# Patient Record
Sex: Female | Born: 1950 | Race: White | Hispanic: No | Smoking: Former smoker
Health system: Southern US, Community
[De-identification: ages and names within clinical notes are randomized; demographics above are authoritative.]

## PROBLEM LIST (undated history)

## (undated) DIAGNOSIS — F419 Anxiety disorder, unspecified: Secondary | ICD-10-CM

## (undated) DIAGNOSIS — E538 Deficiency of other specified B group vitamins: Secondary | ICD-10-CM

## (undated) DIAGNOSIS — M1990 Unspecified osteoarthritis, unspecified site: Secondary | ICD-10-CM

## (undated) DIAGNOSIS — R011 Cardiac murmur, unspecified: Secondary | ICD-10-CM

## (undated) DIAGNOSIS — D732 Chronic congestive splenomegaly: Secondary | ICD-10-CM

## (undated) DIAGNOSIS — K7460 Unspecified cirrhosis of liver: Secondary | ICD-10-CM

## (undated) DIAGNOSIS — R911 Solitary pulmonary nodule: Secondary | ICD-10-CM

## (undated) DIAGNOSIS — I639 Cerebral infarction, unspecified: Secondary | ICD-10-CM

## (undated) DIAGNOSIS — Z86018 Personal history of other benign neoplasm: Secondary | ICD-10-CM

## (undated) DIAGNOSIS — K589 Irritable bowel syndrome without diarrhea: Secondary | ICD-10-CM

## (undated) DIAGNOSIS — Z87891 Personal history of nicotine dependence: Secondary | ICD-10-CM

## (undated) DIAGNOSIS — E119 Type 2 diabetes mellitus without complications: Secondary | ICD-10-CM

## (undated) DIAGNOSIS — E559 Vitamin D deficiency, unspecified: Secondary | ICD-10-CM

## (undated) DIAGNOSIS — L299 Pruritus, unspecified: Secondary | ICD-10-CM

## (undated) DIAGNOSIS — Z720 Tobacco use: Secondary | ICD-10-CM

## (undated) DIAGNOSIS — M8000XA Age-related osteoporosis with current pathological fracture, unspecified site, initial encounter for fracture: Secondary | ICD-10-CM

## (undated) DIAGNOSIS — Z8719 Personal history of other diseases of the digestive system: Secondary | ICD-10-CM

## (undated) DIAGNOSIS — J45909 Unspecified asthma, uncomplicated: Secondary | ICD-10-CM

## (undated) DIAGNOSIS — J189 Pneumonia, unspecified organism: Secondary | ICD-10-CM

## (undated) DIAGNOSIS — I10 Essential (primary) hypertension: Secondary | ICD-10-CM

## (undated) DIAGNOSIS — Q859 Phakomatosis, unspecified: Secondary | ICD-10-CM

## (undated) DIAGNOSIS — Z800 Family history of malignant neoplasm of digestive organs: Secondary | ICD-10-CM

## (undated) DIAGNOSIS — J449 Chronic obstructive pulmonary disease, unspecified: Secondary | ICD-10-CM

## (undated) DIAGNOSIS — K219 Gastro-esophageal reflux disease without esophagitis: Secondary | ICD-10-CM

## (undated) DIAGNOSIS — R3129 Other microscopic hematuria: Secondary | ICD-10-CM

## (undated) DIAGNOSIS — R0600 Dyspnea, unspecified: Secondary | ICD-10-CM

## (undated) DIAGNOSIS — I700 Atherosclerosis of aorta: Secondary | ICD-10-CM

## (undated) DIAGNOSIS — E785 Hyperlipidemia, unspecified: Secondary | ICD-10-CM

## (undated) DIAGNOSIS — Z8051 Family history of malignant neoplasm of kidney: Secondary | ICD-10-CM

## (undated) DIAGNOSIS — K3189 Other diseases of stomach and duodenum: Secondary | ICD-10-CM

## (undated) DIAGNOSIS — Z803 Family history of malignant neoplasm of breast: Secondary | ICD-10-CM

## (undated) DIAGNOSIS — Z801 Family history of malignant neoplasm of trachea, bronchus and lung: Secondary | ICD-10-CM

## (undated) DIAGNOSIS — R809 Proteinuria, unspecified: Secondary | ICD-10-CM

## (undated) DIAGNOSIS — Z972 Presence of dental prosthetic device (complete) (partial): Secondary | ICD-10-CM

## (undated) HISTORY — DX: Personal history of nicotine dependence: Z87.891

## (undated) HISTORY — DX: Deficiency of other specified B group vitamins: E53.8

## (undated) HISTORY — DX: Hyperlipidemia, unspecified: E78.5

## (undated) HISTORY — DX: Irritable bowel syndrome, unspecified: K58.9

## (undated) HISTORY — DX: Family history of malignant neoplasm of kidney: Z80.51

## (undated) HISTORY — DX: Gastro-esophageal reflux disease without esophagitis: K21.9

## (undated) HISTORY — DX: Type 2 diabetes mellitus without complications: E11.9

## (undated) HISTORY — DX: Cerebral infarction, unspecified: I63.9

## (undated) HISTORY — PX: COLON SURGERY: SHX602

## (undated) HISTORY — DX: Tobacco use: Z72.0

## (undated) HISTORY — PX: HERNIA REPAIR: SHX51

## (undated) HISTORY — PX: TONSILLECTOMY: SUR1361

## (undated) HISTORY — DX: Family history of malignant neoplasm of digestive organs: Z80.0

## (undated) HISTORY — DX: Phakomatosis, unspecified: Q85.9

## (undated) HISTORY — DX: Chronic obstructive pulmonary disease, unspecified: J44.9

## (undated) HISTORY — DX: Family history of malignant neoplasm of breast: Z80.3

## (undated) HISTORY — PX: VENTRAL HERNIA REPAIR: SHX424

## (undated) HISTORY — PX: PARTIAL COLECTOMY: SHX5273

## (undated) HISTORY — DX: Essential (primary) hypertension: I10

## (undated) HISTORY — DX: Family history of malignant neoplasm of trachea, bronchus and lung: Z80.1

---
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
## 2005-05-21 ENCOUNTER — Emergency Department: Payer: Self-pay | Admitting: Emergency Medicine

## 2005-05-27 ENCOUNTER — Ambulatory Visit: Payer: Self-pay | Admitting: Internal Medicine

## 2005-07-23 ENCOUNTER — Ambulatory Visit: Payer: Self-pay | Admitting: Unknown Physician Specialty

## 2005-08-14 ENCOUNTER — Ambulatory Visit: Payer: Self-pay | Admitting: Internal Medicine

## 2006-12-29 ENCOUNTER — Ambulatory Visit: Payer: Self-pay | Admitting: Unknown Physician Specialty

## 2007-01-19 ENCOUNTER — Ambulatory Visit: Payer: Self-pay | Admitting: Surgery

## 2007-01-25 ENCOUNTER — Ambulatory Visit: Payer: Self-pay | Admitting: Surgery

## 2007-01-25 ENCOUNTER — Other Ambulatory Visit: Payer: Self-pay

## 2007-02-01 ENCOUNTER — Inpatient Hospital Stay: Payer: Self-pay | Admitting: Surgery

## 2007-09-21 ENCOUNTER — Ambulatory Visit: Payer: Self-pay | Admitting: Internal Medicine

## 2008-06-12 ENCOUNTER — Ambulatory Visit: Payer: Self-pay | Admitting: Surgery

## 2008-06-19 ENCOUNTER — Ambulatory Visit: Payer: Self-pay | Admitting: Surgery

## 2009-10-05 ENCOUNTER — Emergency Department: Payer: Self-pay | Admitting: Internal Medicine

## 2009-11-01 ENCOUNTER — Ambulatory Visit: Payer: Self-pay | Admitting: Adult Health

## 2009-12-22 ENCOUNTER — Emergency Department: Payer: Self-pay | Admitting: Emergency Medicine

## 2010-05-28 ENCOUNTER — Emergency Department: Payer: Self-pay | Admitting: Emergency Medicine

## 2011-07-16 ENCOUNTER — Ambulatory Visit: Payer: Self-pay | Admitting: Internal Medicine

## 2011-07-20 ENCOUNTER — Emergency Department: Payer: Self-pay | Admitting: Emergency Medicine

## 2011-07-20 LAB — URINALYSIS, COMPLETE
Bacteria: NEGATIVE
Glucose,UR: 500 mg/dL (ref 0–75)
Nitrite: NEGATIVE
Protein: NEGATIVE
Specific Gravity: 1.021 (ref 1.003–1.030)

## 2011-07-20 LAB — COMPREHENSIVE METABOLIC PANEL
Albumin: 3.7 g/dL (ref 3.4–5.0)
Alkaline Phosphatase: 119 U/L (ref 50–136)
Anion Gap: 16 (ref 7–16)
BUN: 19 mg/dL — ABNORMAL HIGH (ref 7–18)
Glucose: 345 mg/dL — ABNORMAL HIGH (ref 65–99)
Osmolality: 292 (ref 275–301)
Potassium: 4.2 mmol/L (ref 3.5–5.1)
SGOT(AST): 28 U/L (ref 15–37)
Sodium: 138 mmol/L (ref 136–145)
Total Protein: 7.6 g/dL (ref 6.4–8.2)

## 2011-07-20 LAB — CBC
MCH: 26.2 pg (ref 26.0–34.0)
MCHC: 32.8 g/dL (ref 32.0–36.0)
MCV: 80 fL (ref 80–100)
Platelet: 195 10*3/uL (ref 150–440)
RDW: 16.8 % — ABNORMAL HIGH (ref 11.5–14.5)
WBC: 6.8 10*3/uL (ref 3.6–11.0)

## 2013-04-16 ENCOUNTER — Emergency Department: Payer: Self-pay | Admitting: Internal Medicine

## 2013-12-07 ENCOUNTER — Ambulatory Visit: Payer: Self-pay | Admitting: Surgery

## 2013-12-19 ENCOUNTER — Ambulatory Visit: Payer: Self-pay | Admitting: Surgery

## 2014-03-22 ENCOUNTER — Ambulatory Visit: Payer: Self-pay | Admitting: Physical Medicine and Rehabilitation

## 2014-04-05 DIAGNOSIS — M7062 Trochanteric bursitis, left hip: Secondary | ICD-10-CM

## 2014-04-05 DIAGNOSIS — M5416 Radiculopathy, lumbar region: Secondary | ICD-10-CM | POA: Insufficient documentation

## 2014-04-05 DIAGNOSIS — M51369 Other intervertebral disc degeneration, lumbar region without mention of lumbar back pain or lower extremity pain: Secondary | ICD-10-CM | POA: Insufficient documentation

## 2014-04-05 DIAGNOSIS — M5136 Other intervertebral disc degeneration, lumbar region: Secondary | ICD-10-CM | POA: Insufficient documentation

## 2014-04-05 DIAGNOSIS — M7061 Trochanteric bursitis, right hip: Secondary | ICD-10-CM | POA: Insufficient documentation

## 2014-09-08 NOTE — Op Note (Signed)
PATIENT NAME:  Brenda Beasley, Brenda Beasley MR#:  [id number] DATE OF BIRTH:  1950-11-29  DATE OF PROCEDURE:  12/19/2013  PREOPERATIVE DIAGNOSIS: Ventral hernia.   POSTOPERATIVE DIAGNOSIS: Ventral hernia.   PROCEDURE: Ventral hernia repair.   SURGEON: Loreli Dollar, M.D.   ANESTHESIA: General.   INDICATIONS: This 64 year old has a past history of right colectomy, which was a laparoscopic extraction site in the epigastrium. She has recently noted bulging in the epigastrium just slightly to the right of the midline. A ventral hernia was demonstrated on physical examination, and repair was recommended for definitive treatment.   DESCRIPTION OF PROCEDURE: The patient was placed on the operating table in the supine position under general anesthesia. The abdomen was prepared with ChloraPrep and draped in a sterile manner. There was a palpable mass in the epigastrium at the upper end of the midline incision just slightly to the right of the midline. The palpable mass was approximately 4 cm in dimension. A longitudinally oriented incision was made in the old scar. This incision was approximately 5 cm in length, carried down through subcutaneous tissues, and encountered a ventral hernia sac, which was dissected free from the surrounding structures and dissected up into a fascial ring defect. The sac was separated from the fascial ring defect and was found to contain incarcerated omentum. The fascial defect was enlarged on the right side extending out approximately 8 mm and subsequently could ligate the incarcerated omentum with 0 Surgilon suture ligature, and amputated the ligation site. It appeared to be hemostatic and was then reduced back into the abdominal cavity. Next, the fascial edges were examined. A finger was introduced, and I did not feel any adjacent defect. The properitoneal fat was dissected away from the fascia circumferentially. I then selected a Bard soft mesh, which was cut to create an oval shape of some  1.4 x 1.8 cm, and this was placed oriented transversely in the properitoneal plane, and was sutured to the overlying fascia with 0 Surgilon through and through sutures. Also, the repair was carried out with a transversely oriented suture line of interrupted 0 Surgilon figure-of-8 sutures incorporating mesh into each suture.   The repair looked good. Hemostasis was intact. Subcutaneous tissues and tissues around the repair were infiltrated with 0.5% Sensorcaine with epinephrine. Subcutaneous tissues were approximated with 4-0 chromic pursestring suture. Several other sites of old scar formation were approximated with 4-0 Monocryl. The skin was closed with running 4-0 Monocryl subcuticular suture and Dermabond.   The patient tolerated surgery satisfactorily and was then prepared for transfer to the recovery room.   ____________________________ Lenna Sciara. Rochel Brome, MD jws:jr D: 12/19/2013 12:12:32 ET T: 12/19/2013 12:20:36 ET JOB#: [id number]  cc: Loreli Dollar, MD, <Dictator> Loreli Dollar MD ELECTRONICALLY SIGNED 12/28/2013 18:32

## 2014-10-04 ENCOUNTER — Encounter: Payer: Self-pay | Admitting: Family Medicine

## 2014-10-04 ENCOUNTER — Other Ambulatory Visit: Payer: Self-pay | Admitting: Family Medicine

## 2014-10-04 DIAGNOSIS — Z720 Tobacco use: Secondary | ICD-10-CM

## 2014-10-04 HISTORY — DX: Tobacco use: Z72.0

## 2014-10-05 ENCOUNTER — Ambulatory Visit
Admission: RE | Admit: 2014-10-05 | Discharge: 2014-10-05 | Disposition: A | Discharge status: Home / Self Care | Payer: Medicare Other | Source: Ambulatory Visit | Attending: Family Medicine | Admitting: Family Medicine

## 2014-10-05 ENCOUNTER — Inpatient Hospital Stay: Payer: Medicaid Other | Attending: Family Medicine | Admitting: Family Medicine

## 2014-10-05 DIAGNOSIS — Z122 Encounter for screening for malignant neoplasm of respiratory organs: Secondary | ICD-10-CM

## 2014-10-05 DIAGNOSIS — I700 Atherosclerosis of aorta: Secondary | ICD-10-CM | POA: Insufficient documentation

## 2014-10-05 DIAGNOSIS — F17210 Nicotine dependence, cigarettes, uncomplicated: Secondary | ICD-10-CM | POA: Diagnosis not present

## 2014-10-05 DIAGNOSIS — F17200 Nicotine dependence, unspecified, uncomplicated: Secondary | ICD-10-CM | POA: Insufficient documentation

## 2014-10-05 DIAGNOSIS — Z720 Tobacco use: Secondary | ICD-10-CM

## 2014-10-05 DIAGNOSIS — J439 Emphysema, unspecified: Secondary | ICD-10-CM | POA: Diagnosis not present

## 2014-10-05 NOTE — Progress Notes (Signed)
In accordance with CMS guidelines, patient has meet eligibility criteria including age, absence of signs or symptoms of lung cancer, the specific calculation of cigarette smoking pack-years was 50 years and is a current smoker.   A shared decision-making session was conducted prior to the performance of CT scan. This includes one or more decision aids, includes benefits and harms of screening, follow-up diagnostic testing, over-diagnosis, false positive rate, and total radiation exposure.  Counseling on the importance of adherence to annual lung cancer LDCT screening, impact of co-morbidities, and ability or willingness to undergo diagnosis and treatment is imperative for compliance of the program.  Counseling on the importance of continued smoking cessation for former smokers; the importance of smoking cessation for current smokers and information about tobacco cessation interventions have been given to patient including the [place] at [facility], [address] [place], as well as Cancer Center specific smoking cessation programs.  Written order for lung cancer screening with LDCT has been given to the patient and any and all questions have been answered to the best of my abilities.   Yearly follow up will be scheduled by Shawn Perkins, Thoracic Navigator.   

## 2014-10-09 ENCOUNTER — Telehealth: Payer: Self-pay | Admitting: *Deleted

## 2014-10-09 NOTE — Telephone Encounter (Signed)
Oncology Nurse Navigator Documentation  Oncology Nurse Navigator Flowsheets 10/09/2014  Navigator Encounter Type Screening  Interventions Coordination of Care  Time Spent with Patient 30   Notified patient of LDCT lung cancer screening results of Lung Rads 2 finding with recommendation for 12 month follow up imaging. Also notified of incidental finding of Mediastinum: "Calcified atherosclerotic disease involves the thoracic aorta as well as the RCA and left circumflex Coronary arteries.Focal ectasia of the distal thoracic aorta is noted which measures up to 3.6 cm, image 176 of series 5 and image 37 of series 2." Message with this info has been sent to PCP.

## 2015-03-05 DIAGNOSIS — R809 Proteinuria, unspecified: Secondary | ICD-10-CM | POA: Insufficient documentation

## 2015-09-03 DIAGNOSIS — I10 Essential (primary) hypertension: Secondary | ICD-10-CM | POA: Insufficient documentation

## 2015-09-03 DIAGNOSIS — J439 Emphysema, unspecified: Secondary | ICD-10-CM | POA: Insufficient documentation

## 2015-09-16 ENCOUNTER — Telehealth: Payer: Self-pay | Admitting: *Deleted

## 2015-09-16 NOTE — Telephone Encounter (Signed)
Notified patient that her annual  follow up lung cancer screening low dose CT scan is due. Confirmed that patient is within age range of 55-77, asymptomatic of lung cancer, and no other serious disease processes that would make treatment of lung cancer not possible. The patient is a  current smoker  with a 51 pack year history. The shared decision making visit was completed 10/05/2014. The patient is agreeable for CT scan to be scheduled once insurance has approved it.

## 2015-09-20 ENCOUNTER — Encounter: Payer: Self-pay | Admitting: Family Medicine

## 2015-09-20 ENCOUNTER — Other Ambulatory Visit: Payer: Self-pay | Admitting: Family Medicine

## 2015-09-20 DIAGNOSIS — Z87891 Personal history of nicotine dependence: Secondary | ICD-10-CM

## 2015-09-20 HISTORY — DX: Personal history of nicotine dependence: Z87.891

## 2015-10-04 ENCOUNTER — Ambulatory Visit
Admission: RE | Admit: 2015-10-04 | Discharge: 2015-10-04 | Disposition: A | Discharge status: Home / Self Care | Payer: Medicare Other | Source: Ambulatory Visit | Attending: Family Medicine | Admitting: Family Medicine

## 2015-10-04 DIAGNOSIS — I2510 Atherosclerotic heart disease of native coronary artery without angina pectoris: Secondary | ICD-10-CM | POA: Insufficient documentation

## 2015-10-04 DIAGNOSIS — Z87891 Personal history of nicotine dependence: Secondary | ICD-10-CM | POA: Diagnosis not present

## 2015-10-08 ENCOUNTER — Telehealth: Payer: Self-pay | Admitting: *Deleted

## 2015-10-08 NOTE — Telephone Encounter (Signed)
Notified patient of LDCT lung cancer screening results with recommendation for 12 month follow up imaging. Also notified of incidental finding noted below. Patient verbalizes understanding.   IMPRESSION: 1. Lung-RADS Category 2, benign appearance or behavior. Continue annual screening with low-dose chest CT without contrast in 12 months. 2. Three-vessel coronary artery calcification.

## 2015-11-04 ENCOUNTER — Encounter: Payer: Self-pay | Admitting: *Deleted

## 2015-11-04 ENCOUNTER — Encounter: Payer: Medicare Other | Attending: Internal Medicine | Admitting: *Deleted

## 2015-11-04 VITALS — BP 140/70 | Ht 60.0 in | Wt 157.2 lb

## 2015-11-04 DIAGNOSIS — E119 Type 2 diabetes mellitus without complications: Secondary | ICD-10-CM | POA: Insufficient documentation

## 2015-11-04 NOTE — Progress Notes (Signed)
Diabetes Self-Management Education  Visit Type: First/Initial  Appt. Start Time: 1050 Appt. End Time: 1210  11/04/2015  Brenda Beasley, identified by name and date of birth, is a 65 y.o. female with a diagnosis of Diabetes: Type 2.   ASSESSMENT  Blood pressure 140/70, height 5' (1.524 m), weight 157 lb 3.2 oz (71.305 kg). Body mass index is 30.7 kg/(m^2).      Diabetes Self-Management Education - 11/04/15 1322    Visit Information   Visit Type First/Initial   Initial Visit   Diabetes Type Type 2   Are you currently following a meal plan? Yes   What type of meal plan do you follow? "no sweets, no bread, no potatoes"   Are you taking your medications as prescribed? Yes   Date Diagnosed "can't remember"   Health Coping   How would you rate your overall health? Good   Psychosocial Assessment   Patient Belief/Attitude about Diabetes Motivated to manage diabetes   Self-management support Doctor's office;Family   Patient Concerns Nutrition/Meal planning;Glycemic Control;Problem Solving   Special Needs None   Preferred Learning Style Hands on   Learning Readiness Change in progress   How often do you need to have someone help you when you read instructions, pamphlets, or other written materials from your doctor or pharmacy? 2 - Rarely   What is the last grade level you completed in school? 9th grade   Pre-Education Assessment   Patient understands the diabetes disease and treatment process. Needs Instruction   Patient understands incorporating nutritional management into lifestyle. Needs Instruction   Patient undertands incorporating physical activity into lifestyle. Needs Instruction   Patient understands using medications safely. Needs Instruction   Patient understands monitoring blood glucose, interpreting and using results Needs Instruction   Patient understands prevention, detection, and treatment of acute complications. Needs Instruction   Patient understands prevention,  detection, and treatment of chronic complications. Needs Instruction   Patient understands how to develop strategies to address psychosocial issues. Needs Instruction   Patient understands how to develop strategies to promote health/change behavior. Needs Instruction   Complications   Last HgB A1C per patient/outside source 8.3 %  08/27/15   How often do you check your blood sugar? 1-2 times/day   Fasting Blood glucose range (mg/dL) 130-179;70-129  FBG's 120-174 mg/dL   Postprandial Blood glucose range (mg/dL) --  BG at 10 pm - 105-241 mg/dL   Number of hypoglycemic episodes per month 2   Can you tell when your blood sugar is low? Yes   What do you do if your blood sugar is low? drank orange juice and ate peanut butter crackers   Have you had a dilated eye exam in the past 12 months? No   Have you had a dental exam in the past 12 months? Yes  dentures   Are you checking your feet? Yes   How many days per week are you checking your feet? 7   Dietary Intake   Breakfast bacon and mayo sandwich   Lunch tomato sandwich   Dinner baked/fried chicken; pork tenderloin; cubed steak; cabbage green beans   Beverage(s) unsweetened tea, coffee, water, occasional diet soda   Exercise   Exercise Type Light (walking / raking leaves)   How many days per week to you exercise? 7   How many minutes per day do you exercise? 10   Total minutes per week of exercise 70   Patient Education   Previous Diabetes Education No   Disease state  Definition of diabetes, type 1 and 2, and the diagnosis of diabetes   Nutrition management  Role of diet in the treatment of diabetes and the relationship between the three main macronutrients and blood glucose level   Physical activity and exercise  Role of exercise on diabetes management, blood pressure control and cardiac health.   Medications Reviewed patients medication for diabetes, action, purpose, timing of dose and side effects.   Monitoring Purpose and frequency  of SMBG.;Identified appropriate SMBG and/or A1C goals.   Acute complications Taught treatment of hypoglycemia - the 15 rule.   Chronic complications Relationship between chronic complications and blood glucose control;Retinopathy and reason for yearly dilated eye exams   Psychosocial adjustment Identified and addressed patients feelings and concerns about diabetes   Personal strategies to promote health Review risk of smoking and offered smoking cessation   Individualized Goals (developed by patient)   Reducing Risk Improve blood sugars Prevent diabetes complications   Outcomes   Expected Outcomes Demonstrated interest in learning. Expect positive outcomes      Individualized Plan for Diabetes Self-Management Training:   Learning Objective:  Patient will have a greater understanding of diabetes self-management. Patient education plan is to attend individual and/or group sessions per assessed needs and concerns.   Plan:   Patient Instructions  Check blood sugars 2 x day before breakfast and 2 hrs after supper every day Exercise: Continue walking for  10  minutes   7  days a week Eat 3 meals day,  1-2 snacks a day Space meals 4-6 hours apart Don't skip meals Include small serving of protein with lunch Eat a small snack at bedtime with 1 serving of protein and 1 serving of carbohydrate Make an eye doctor appointment Bring blood sugar records to the next class Carry fast acting glucose and a snack at all times   Expected Outcomes:  Demonstrated interest in learning. Expect positive outcomes  Education material provided:  General Meal Planning Guidelines Simple Meal Plan Glucose tablets Symptoms, causes and treatments of Hypoglycemia  If problems or questions, patient to contact team via:  Johny Drilling, [place], [place], CDE [phone]  Future DSME appointment:  Thursday November 28, 2015 for Diabetes Class 1

## 2015-11-04 NOTE — Patient Instructions (Addendum)
Check blood sugars 2 x day before breakfast and 2 hrs after supper every day Exercise: Continue walking for  10  minutes   7  days a week Eat 3 meals day,  1-2 snacks a day Space meals 4-6 hours apart Don't skip meals Include small serving of protein with lunch Eat a small snack at bedtime with 1 serving of protein and 1 serving of carbohydrate Make an eye doctor appointment Bring blood sugar records to the next class Carry fast acting glucose and a snack at all times

## 2015-11-28 ENCOUNTER — Encounter: Payer: Self-pay | Admitting: Dietician

## 2015-11-28 ENCOUNTER — Encounter: Payer: Medicare Other | Attending: Internal Medicine | Admitting: Dietician

## 2015-11-28 VITALS — Ht 60.0 in | Wt 157.8 lb

## 2015-11-28 DIAGNOSIS — E119 Type 2 diabetes mellitus without complications: Secondary | ICD-10-CM

## 2015-11-28 NOTE — Progress Notes (Signed)

## 2015-12-05 ENCOUNTER — Encounter: Payer: Medicare Other | Admitting: *Deleted

## 2015-12-05 ENCOUNTER — Encounter: Payer: Self-pay | Admitting: *Deleted

## 2015-12-05 VITALS — Wt 155.8 lb

## 2015-12-05 DIAGNOSIS — E119 Type 2 diabetes mellitus without complications: Secondary | ICD-10-CM | POA: Diagnosis not present

## 2015-12-05 NOTE — Progress Notes (Signed)

## 2015-12-12 ENCOUNTER — Encounter: Payer: Medicare Other | Admitting: Dietician

## 2015-12-12 ENCOUNTER — Encounter: Payer: Self-pay | Admitting: Dietician

## 2015-12-12 VITALS — BP 120/78 | Ht 60.0 in | Wt 157.1 lb

## 2015-12-12 DIAGNOSIS — E119 Type 2 diabetes mellitus without complications: Secondary | ICD-10-CM

## 2015-12-12 NOTE — Progress Notes (Signed)

## 2015-12-13 ENCOUNTER — Encounter: Payer: Self-pay | Admitting: *Deleted

## 2015-12-19 DIAGNOSIS — M47816 Spondylosis without myelopathy or radiculopathy, lumbar region: Secondary | ICD-10-CM | POA: Insufficient documentation

## 2016-01-07 DIAGNOSIS — I700 Atherosclerosis of aorta: Secondary | ICD-10-CM | POA: Insufficient documentation

## 2016-03-30 ENCOUNTER — Encounter: Payer: Self-pay | Admitting: Emergency Medicine

## 2016-03-30 ENCOUNTER — Emergency Department: Payer: Medicare Other

## 2016-03-30 ENCOUNTER — Emergency Department
Admission: EM | Admit: 2016-03-30 | Discharge: 2016-03-30 | Disposition: A | Discharge status: Home / Self Care | Payer: Medicare Other | Attending: Student in an Organized Health Care Education/Training Program | Admitting: Student in an Organized Health Care Education/Training Program

## 2016-03-30 DIAGNOSIS — E119 Type 2 diabetes mellitus without complications: Secondary | ICD-10-CM | POA: Insufficient documentation

## 2016-03-30 DIAGNOSIS — Z7984 Long term (current) use of oral hypoglycemic drugs: Secondary | ICD-10-CM | POA: Insufficient documentation

## 2016-03-30 DIAGNOSIS — Z7982 Long term (current) use of aspirin: Secondary | ICD-10-CM | POA: Insufficient documentation

## 2016-03-30 DIAGNOSIS — J441 Chronic obstructive pulmonary disease with (acute) exacerbation: Secondary | ICD-10-CM

## 2016-03-30 DIAGNOSIS — M546 Pain in thoracic spine: Secondary | ICD-10-CM | POA: Diagnosis not present

## 2016-03-30 DIAGNOSIS — Z79899 Other long term (current) drug therapy: Secondary | ICD-10-CM | POA: Diagnosis not present

## 2016-03-30 DIAGNOSIS — R0602 Shortness of breath: Secondary | ICD-10-CM

## 2016-03-30 DIAGNOSIS — I10 Essential (primary) hypertension: Secondary | ICD-10-CM | POA: Diagnosis not present

## 2016-03-30 DIAGNOSIS — F17210 Nicotine dependence, cigarettes, uncomplicated: Secondary | ICD-10-CM | POA: Diagnosis not present

## 2016-03-30 LAB — BASIC METABOLIC PANEL
ANION GAP: 9 (ref 5–15)
BUN: 16 mg/dL (ref 6–20)
CALCIUM: 9.3 mg/dL (ref 8.9–10.3)
CO2: 26 mmol/L (ref 22–32)
Chloride: 103 mmol/L (ref 101–111)
Creatinine, Ser: 0.73 mg/dL (ref 0.44–1.00)
Glucose, Bld: 204 mg/dL — ABNORMAL HIGH (ref 65–99)
POTASSIUM: 4.2 mmol/L (ref 3.5–5.1)
SODIUM: 138 mmol/L (ref 135–145)

## 2016-03-30 LAB — CBC
HEMATOCRIT: 37.5 % (ref 35.0–47.0)
HEMOGLOBIN: 12.4 g/dL (ref 12.0–16.0)
MCH: 26.2 pg (ref 26.0–34.0)
MCHC: 33.1 g/dL (ref 32.0–36.0)
MCV: 79.3 fL — ABNORMAL LOW (ref 80.0–100.0)
Platelets: 174 10*3/uL (ref 150–440)
RBC: 4.73 MIL/uL (ref 3.80–5.20)
RDW: 16.1 % — AB (ref 11.5–14.5)
WBC: 8.9 10*3/uL (ref 3.6–11.0)

## 2016-03-30 LAB — TROPONIN I: Troponin I: <0.03 ng/mL (ref <0.03)

## 2016-03-30 LAB — FIBRIN DERIVATIVES D-DIMER (ARMC ONLY): FIBRIN DERIVATIVES D-DIMER (ARMC): 389 (ref 0–499)

## 2016-03-30 LAB — MAGNESIUM: MAGNESIUM: 1.4 mg/dL — AB (ref 1.7–2.4)

## 2016-03-30 MED ORDER — TRAMADOL HCL 50 MG PO TABS: 50.0000 mg | ORAL_TABLET | Freq: Four times a day (QID) | ORAL | 0 refills | Status: AC | PRN

## 2016-03-30 MED ORDER — FENTANYL CITRATE (PF) 100 MCG/2ML IJ SOLN: 50.0000 ug | INTRAMUSCULAR | Status: DC | PRN

## 2016-03-30 MED ORDER — METHYLPREDNISOLONE SODIUM SUCC 125 MG IJ SOLR
125.0000 mg | Freq: Once | INTRAMUSCULAR | Status: AC
  Administered 2016-03-30: 125 mg via INTRAVENOUS
  Filled 2016-03-30: qty 2

## 2016-03-30 MED ORDER — AZITHROMYCIN 250 MG PO TABS
ORAL_TABLET | ORAL | 0 refills | Status: AC
Start: 2016-03-30 — End: 2016-04-04

## 2016-03-30 MED ORDER — PREDNISONE 20 MG PO TABS: 40.0000 mg | ORAL_TABLET | Freq: Every day | ORAL | 0 refills | Status: AC

## 2016-03-30 MED ORDER — ALBUTEROL SULFATE (2.5 MG/3ML) 0.083% IN NEBU
5.0000 mg | INHALATION_SOLUTION | Freq: Once | RESPIRATORY_TRACT | Status: AC
Start: 2016-03-30 — End: 2016-03-30
  Administered 2016-03-30: 5 mg via RESPIRATORY_TRACT
  Filled 2016-03-30: qty 6

## 2016-03-30 MED ORDER — AZITHROMYCIN 500 MG PO TABS
500.0000 mg | ORAL_TABLET | Freq: Once | ORAL | Status: AC
  Administered 2016-03-30: 500 mg via ORAL
  Filled 2016-03-30: qty 1

## 2016-03-30 MED ORDER — IBUPROFEN 600 MG PO TABS
600.0000 mg | ORAL_TABLET | Freq: Once | ORAL | Status: AC
  Administered 2016-03-30: 600 mg via ORAL
  Filled 2016-03-30: qty 1

## 2016-03-30 MED ORDER — IPRATROPIUM-ALBUTEROL 0.5-2.5 (3) MG/3ML IN SOLN
3.0000 mL | Freq: Once | RESPIRATORY_TRACT | Status: AC
  Administered 2016-03-30: 3 mL via RESPIRATORY_TRACT
  Filled 2016-03-30: qty 3

## 2016-03-30 MED ORDER — CYCLOBENZAPRINE HCL 10 MG PO TABS: 10.0000 mg | ORAL_TABLET | Freq: Every day | ORAL | 0 refills | Status: AC

## 2016-03-30 MED ORDER — MAGNESIUM SULFATE 2 GM/50ML IV SOLN
2.0000 g | Freq: Once | INTRAVENOUS | Status: AC
  Administered 2016-03-30: 2 g via INTRAVENOUS
  Filled 2016-03-30: qty 50

## 2016-03-30 NOTE — Discharge Instructions (Signed)
Return to ER for any increase in pain, if the pain changes or becomes worse with physical activity, you have shortness of breath, nausea or vomiting associated with the chest pain.   Call Dr. Bethanne Ginger office today.  He would like to see you in clinic if possible.

## 2016-03-30 NOTE — ED Provider Notes (Signed)
[facility] Emergency Department Provider Note    First MD Initiated Contact with Patient 03/30/16 1110     (approximate)  I have reviewed the triage vital signs and the nursing notes.   HISTORY  Chief Complaint Shortness of Breath    HPI Brenda Beasley is a 65 y.o. female with a history of COPD as well as diabetes hypertension and persistent up 1 pack per day smoking history who presents with worsening shortness of breath and bilateral posterior thoracic pain that she describes as worse with taking a deep breath for the past 2 days. She denies any fevers but has had a productive cough as well as nasal congestion. States that she did not sleep at all last night due to worsening shortness of breath and orthopnea. Has been using her inhalers more frequently than previous. She denies any chest pain or pressure. Denies any abdominal pain. No lower extremity swelling.   Past Medical History:  Diagnosis Date  . COPD (chronic obstructive pulmonary disease) ([place])   . Diabetes mellitus without complication ([place])   . GERD (gastroesophageal reflux disease)   . Hyperlipidemia   . Hypertension   . IBS (irritable bowel syndrome)   . Personal history of tobacco use, presenting hazards to health 09/20/2015  . Stroke ([place])   . Tobacco abuse 10/04/2014  . Vitamin B 12 deficiency    Family History  Problem Relation Age of Onset  . Diabetes Mother   . Diabetes Father   . Diabetes Sister   . Diabetes Maternal Grandmother   . Diabetes Paternal Grandmother    Past Surgical History:  Procedure Laterality Date  . PARTIAL COLECTOMY    . TONSILLECTOMY    . VENTRAL HERNIA REPAIR     Patient Active Problem List   Diagnosis Date Noted  . Personal history of tobacco use, presenting hazards to health 09/20/2015  . Tobacco abuse 10/04/2014      Prior to Admission medications   Medication Sig Start Date End Date Taking? Authorizing Provider  albuterol (PROAIR HFA) 108  (90 Base) MCG/ACT inhaler Inhale 2 puffs into the lungs every 6 (six) hours as needed. 03/05/15   Historical Provider, MD  amLODipine (NORVASC) 5 MG tablet Take 5 mg by mouth daily. 03/05/15   Historical Provider, MD  aspirin EC 81 MG tablet Take 81 mg by mouth daily.    Historical Provider, MD  azelastine (ASTELIN) 0.1 % nasal spray Place 1 spray into the nose 2 (two) times daily. 09/03/15   Historical Provider, MD  benzonatate (TESSALON) 200 MG capsule Take 200 mg by mouth 3 (three) times daily as needed. 04/05/15   Historical Provider, MD  Cholecalciferol (VITAMIN D3) 2000 units capsule Take 2 capsules by mouth daily.    Historical Provider, MD  cyanocobalamin (,VITAMIN B-12,) 1000 MCG/ML injection Inject 1,000 mcg into the muscle every 30 (thirty) days. 10/23/14   Historical Provider, MD  ferrous sulfate 325 (65 FE) MG tablet Take 325 mg by mouth daily. 03/15/15   Historical Provider, MD  fluticasone (FLONASE) 50 MCG/ACT nasal spray Place 2 sprays into the nose daily. 03/05/15   Historical Provider, MD  gabapentin (NEURONTIN) 100 MG capsule Take 2 capsules by mouth at bedtime. 03/05/15   Historical Provider, MD  glipiZIDE (GLUCOTROL) 10 MG tablet Take 2 tablets by mouth 2 (two) times daily. 03/15/15   Historical Provider, MD  hydrochlorothiazide (HYDRODIURIL) 25 MG tablet Take 25 mg by mouth daily. 03/05/15   Historical Provider, MD  lisinopril (PRINIVIL,ZESTRIL) 40 MG tablet Take 40 mg by mouth daily. 03/05/15   Historical Provider, MD  lovastatin (MEVACOR) 40 MG tablet Take 2 tablets by mouth daily. 03/05/15   Historical Provider, MD  metFORMIN (GLUCOPHAGE) 1000 MG tablet Take 1,000 mg by mouth 2 (two) times daily. 03/05/15   Historical Provider, MD  omeprazole (PRILOSEC) 20 MG capsule Take 20 mg by mouth 2 (two) times daily. 03/05/15   Historical Provider, MD    Allergies Other; Penicillins; Pioglitazone; Byetta 10 mcg pen [exenatide]; Victoza [liraglutide]; and Sulfa antibiotics    Social  History Social History  Substance Use Topics  . Smoking status: Current Every Day Smoker    Packs/day: 0.50    Years: 52.00    Types: Cigarettes  . Smokeless tobacco: Not on file  . Alcohol use No    Review of Systems Patient denies headaches, rhinorrhea, blurry vision, numbness, shortness of breath, chest pain, edema, cough, abdominal pain, nausea, vomiting, diarrhea, dysuria, fevers, rashes or hallucinations unless otherwise stated above in HPI. ____________________________________________   PHYSICAL EXAM:  VITAL SIGNS: Vitals:   03/30/16 0822  BP: (!) 162/67  Pulse: 70  Resp: 20  Temp: 97.9 F (36.6 C)    Constitutional: Alert and oriented.with mild tachypnea and difficulty speaking in complete sentences. Eyes: Conjunctivae are normal. PERRL. EOMI. Head: Atraumatic. Nose: No congestion/rhinnorhea. Mouth/Throat: Mucous membranes are moist.  Oropharynx non-erythematous. Neck: No stridor. Painless ROM. No cervical spine tenderness to palpation Hematological/Lymphatic/Immunilogical: No cervical lymphadenopathy. Cardiovascular: Normal rate, regular rhythm. Grossly normal heart sounds.  Good peripheral circulation. Respiratory: mild tachypnea,   No retractions. Lungs with diminished breathsounds, coarse bibasilar BS Gastrointestinal: Soft and nontender. No distention. No abdominal bruits. No CVA tenderness.  Musculoskeletal: No lower extremity tenderness nor edema.  No joint effusions. Neurologic:  Normal speech and language. No gross focal neurologic deficits are appreciated. No gait instability. Skin:  Skin is warm, dry and intact. No rash noted. Psychiatric: Mood and affect are normal. Speech and behavior are normal.  ____________________________________________   LABS (all labs ordered are listed, but only abnormal results are displayed)  Results for orders placed or performed during the hospital encounter of 03/30/16 (from the past 24 hour(s))  Basic metabolic  panel     Status: Abnormal   Collection Time: 03/30/16  8:26 AM  Result Value Ref Range   Sodium 138 135 - 145 mmol/L   Potassium 4.2 3.5 - 5.1 mmol/L   Chloride 103 101 - 111 mmol/L   CO2 26 22 - 32 mmol/L   Glucose, Bld 204 (H) 65 - 99 mg/dL   BUN 16 6 - 20 mg/dL   Creatinine, Ser 0.73 0.44 - 1.00 mg/dL   Calcium 9.3 8.9 - 10.3 mg/dL   GFR calc non Af Amer >60 >60 mL/min   GFR calc Af Amer >60 >60 mL/min   Anion gap 9 5 - 15  CBC     Status: Abnormal   Collection Time: 03/30/16  8:26 AM  Result Value Ref Range   WBC 8.9 3.6 - 11.0 K/uL   RBC 4.73 3.80 - 5.20 MIL/uL   Hemoglobin 12.4 12.0 - 16.0 g/dL   HCT 37.5 35.0 - 47.0 %   MCV 79.3 (L) 80.0 - 100.0 fL   MCH 26.2 26.0 - 34.0 pg   MCHC 33.1 32.0 - 36.0 g/dL   RDW 16.1 (H) 11.5 - 14.5 %   Platelets 174 150 - 440 K/uL  Troponin I     Status: None  Collection Time: 03/30/16  8:26 AM  Result Value Ref Range   Troponin I <0.03 <0.03 ng/mL   ____________________________________________  EKG My review and personal interpretation at Time: 8:17   Indication: sob  Rate: 65  Rhythm: sinus Axis: normal Other: non specific t wave changes as compared to previous in V3 and V4 ____________________________________________  RADIOLOGY  I personally reviewed all radiographic images ordered to evaluate for the above acute complaints and reviewed radiology reports and findings.  These findings were personally discussed with the patient.  Please see medical record for radiology report. ____________________________________________   PROCEDURES  Procedure(s) performed: none Procedures    Critical Care performed: no ____________________________________________   INITIAL IMPRESSION / ASSESSMENT AND PLAN / ED COURSE  Pertinent labs & imaging results that were available during my care of the patient were reviewed by me and considered in my medical decision making (see chart for details).  DDX: Asthma, copd, CHF, pna, ptx,  malignancy, Pe, anemia   Brenda A Maseda is a 65 y.o. who presents to the ED with Shortness of breath for the past 2 days as well as pleuritic chest pain.  Patient is AFVSS in ED. Exam as above. Given current presentation have considered the above differential.  Given her history of worsening shortness of breath and he suspect underlying COPD exacerbation. Will give nebulizer treatments. Patient denies any chest pain but does have nonspecifc T-wave inversions in precordial leads on EKG. presentation seems less consistent with ACS based on history and duration of symptoms but will further risk stratify with EKG and troponins as she does have multiple risk factors.  Not appear to be clinically consistent with dissection as she has equal pulses bilaterally, is not tachycardic and has a normal crisp aortic knob on chest x-ray Given her pleuritic chest pain will further risk stratify for PE with d-dimer as she is otherwise low risk by well's criteria.  The patient will be placed on continuous pulse oximetry and telemetry for monitoring.  Laboratory evaluation will be sent to evaluate for the above complaints.     Clinical Course as of Mar 30 1424  Mon Mar 30, 2016  1156 Mag low.  Will give 2g IV.  [PR]  [id number] D-dimer negative.  Currently receiving nebulizer treatments.  [PR]  [id number] Reassessed patient after nebulizer treatments pulse ox 89% while sitting..  [PR]  1253 Repeat troponin is negative.  [PR]  1401 Patient was able to ambulate without any hypoxia or worsening dyspnea.  Patient was able to tolerate PO and was able to ambulate with a steady gait.  [PR]  1414 Patient requesting DC home.  I recommended further observation but patient felt much improved And requested discharge home.  I spoke with Dr. Satira Mccallum regarding the abnormal EKG and she has no chest pain at this time and 2 negative sets of troponins he is agreeing to arrange close follow-up with her within the next 24 hours. She symptomatically  improved without persistent discomfort do feel that close outpatient follow-up is appropriate and reasonable.  We'll discharge with antibiotics, steroids and muscle relaxants for back pain. I discussed nonspecific abnormality on EKG and need to follow-up with Dr. Ubaldo Glassing. Patient agrees to follow-up in his clinic.  Have discussed with the patient and available family all diagnostics and treatments performed thus far and all questions were answered to the best of my ability. The patient demonstrates understanding and agreement with plan.   [PR]    Clinical Course User Index [PR] Merlyn Lot,  MD     ____________________________________________   FINAL CLINICAL IMPRESSION(S) / ED DIAGNOSES  Final diagnoses:  COPD exacerbation ([place])  Shortness of breath  Acute bilateral thoracic back pain      NEW MEDICATIONS STARTED DURING THIS VISIT:  New Prescriptions   No medications on file     Note:  This document was prepared using Dragon voice recognition software and may include unintentional dictation errors.    Merlyn Lot, MD 03/30/16 [phone]

## 2016-03-30 NOTE — ED Triage Notes (Signed)
Pt presents with shortness of breath, upper back pain and sinus congestion and pressure. Pt reports symptoms began the past couple of days. Pt speaking in complete sentences without difficulty. Pt reports hurts when she takes a deep breath.

## 2016-03-30 NOTE — ED Notes (Signed)
Called x 2 to go room, no answer.

## 2016-03-30 NOTE — ED Notes (Signed)
Notified lab of add ons.

## 2016-09-15 ENCOUNTER — Telehealth: Payer: Self-pay | Admitting: *Deleted

## 2016-09-15 NOTE — Telephone Encounter (Signed)
Left message for patient to notify them that it is time to schedule annual low dose lung cancer screening CT scan. Instructed patient to call back to verify information prior to the scan being scheduled.  

## 2016-09-22 ENCOUNTER — Telehealth: Payer: Self-pay

## 2016-09-22 ENCOUNTER — Telehealth: Payer: Self-pay | Admitting: *Deleted

## 2016-09-22 DIAGNOSIS — Z87891 Personal history of nicotine dependence: Secondary | ICD-10-CM

## 2016-09-22 NOTE — Telephone Encounter (Signed)
Called pt LVM with appt details  5/22 @ 1pm arrive 15 mins early Outpatient imaging center  Please call with any questions or concerns about appt date or time.

## 2016-09-22 NOTE — Telephone Encounter (Signed)
Notified patient that annual lung cancer screening low dose CT scan is due currently or will be in near future. Confirmed that patient is within the age range of 55-77, and asymptomatic, (no signs or symptoms of lung cancer). Patient denies illness that would prevent curative treatment for lung cancer if found. Verified smoking history, (50.5 pack year, current). The shared decision making visit was done 10/05/14. Patient is agreeable for CT scan being scheduled.

## 2016-10-06 ENCOUNTER — Ambulatory Visit
Admission: RE | Admit: 2016-10-06 | Discharge: 2016-10-06 | Disposition: A | Discharge status: Home / Self Care | Payer: Medicare Other | Source: Ambulatory Visit | Attending: Oncology | Admitting: Oncology

## 2016-10-06 DIAGNOSIS — Z122 Encounter for screening for malignant neoplasm of respiratory organs: Secondary | ICD-10-CM | POA: Diagnosis not present

## 2016-10-06 DIAGNOSIS — Z87891 Personal history of nicotine dependence: Secondary | ICD-10-CM

## 2016-10-06 DIAGNOSIS — I700 Atherosclerosis of aorta: Secondary | ICD-10-CM | POA: Diagnosis not present

## 2016-10-06 DIAGNOSIS — I2510 Atherosclerotic heart disease of native coronary artery without angina pectoris: Secondary | ICD-10-CM | POA: Insufficient documentation

## 2016-10-06 DIAGNOSIS — J432 Centrilobular emphysema: Secondary | ICD-10-CM | POA: Insufficient documentation

## 2016-10-06 DIAGNOSIS — N281 Cyst of kidney, acquired: Secondary | ICD-10-CM | POA: Diagnosis not present

## 2016-10-15 ENCOUNTER — Encounter: Payer: Self-pay | Admitting: *Deleted

## 2016-11-13 DIAGNOSIS — M810 Age-related osteoporosis without current pathological fracture: Secondary | ICD-10-CM | POA: Insufficient documentation

## 2017-02-11 ENCOUNTER — Emergency Department
Admission: EM | Admit: 2017-02-11 | Discharge: 2017-02-11 | Disposition: A | Discharge status: Home / Self Care | Payer: Medicare Other | Attending: Emergency Medicine | Admitting: Emergency Medicine

## 2017-02-11 ENCOUNTER — Encounter: Payer: Self-pay | Admitting: Emergency Medicine

## 2017-02-11 DIAGNOSIS — Z8673 Personal history of transient ischemic attack (TIA), and cerebral infarction without residual deficits: Secondary | ICD-10-CM | POA: Diagnosis not present

## 2017-02-11 DIAGNOSIS — Z79899 Other long term (current) drug therapy: Secondary | ICD-10-CM | POA: Insufficient documentation

## 2017-02-11 DIAGNOSIS — I10 Essential (primary) hypertension: Secondary | ICD-10-CM | POA: Diagnosis not present

## 2017-02-11 DIAGNOSIS — Z7982 Long term (current) use of aspirin: Secondary | ICD-10-CM | POA: Insufficient documentation

## 2017-02-11 DIAGNOSIS — F17210 Nicotine dependence, cigarettes, uncomplicated: Secondary | ICD-10-CM | POA: Diagnosis not present

## 2017-02-11 DIAGNOSIS — E1165 Type 2 diabetes mellitus with hyperglycemia: Secondary | ICD-10-CM | POA: Insufficient documentation

## 2017-02-11 DIAGNOSIS — R739 Hyperglycemia, unspecified: Secondary | ICD-10-CM

## 2017-02-11 DIAGNOSIS — Z7984 Long term (current) use of oral hypoglycemic drugs: Secondary | ICD-10-CM | POA: Diagnosis not present

## 2017-02-11 DIAGNOSIS — J449 Chronic obstructive pulmonary disease, unspecified: Secondary | ICD-10-CM | POA: Diagnosis not present

## 2017-02-11 LAB — URINALYSIS, COMPLETE (UACMP) WITH MICROSCOPIC
BACTERIA UA: NONE SEEN
BILIRUBIN URINE: NEGATIVE
Glucose, UA: >=500 mg/dL — AB
Ketones, ur: NEGATIVE mg/dL
Nitrite: NEGATIVE
PROTEIN: NEGATIVE mg/dL
Specific Gravity, Urine: 1.02 (ref 1.005–1.030)
pH: 5 (ref 5.0–8.0)

## 2017-02-11 LAB — CBC
HEMATOCRIT: 37.7 % (ref 35.0–47.0)
Hemoglobin: 12.6 g/dL (ref 12.0–16.0)
MCH: 26.8 pg (ref 26.0–34.0)
MCHC: 33.5 g/dL (ref 32.0–36.0)
MCV: 79.9 fL — AB (ref 80.0–100.0)
Platelets: 204 10*3/uL (ref 150–440)
RBC: 4.71 MIL/uL (ref 3.80–5.20)
RDW: 16.1 % — AB (ref 11.5–14.5)
WBC: 10.1 10*3/uL (ref 3.6–11.0)

## 2017-02-11 LAB — BASIC METABOLIC PANEL
Anion gap: 12 (ref 5–15)
BUN: 21 mg/dL — ABNORMAL HIGH (ref 6–20)
CALCIUM: 8.9 mg/dL (ref 8.9–10.3)
CO2: 24 mmol/L (ref 22–32)
CREATININE: 0.97 mg/dL (ref 0.44–1.00)
Chloride: 92 mmol/L — ABNORMAL LOW (ref 101–111)
GFR calc Af Amer: >60 mL/min (ref >60)
GFR, EST NON AFRICAN AMERICAN: 59 mL/min — AB (ref >60)
Glucose, Bld: 520 mg/dL (ref 65–99)
POTASSIUM: 4.5 mmol/L (ref 3.5–5.1)
Sodium: 128 mmol/L — ABNORMAL LOW (ref 135–145)

## 2017-02-11 LAB — GLUCOSE, CAPILLARY
GLUCOSE-CAPILLARY: 512 mg/dL — AB (ref 65–99)
GLUCOSE-CAPILLARY: 230 mg/dL — AB (ref 65–99)

## 2017-02-11 MED ORDER — SODIUM CHLORIDE 0.9 % IV SOLN
Freq: Once | INTRAVENOUS | Status: AC
  Administered 2017-02-11: 18:00:00 via INTRAVENOUS

## 2017-02-11 MED ORDER — INSULIN ASPART 100 UNIT/ML ~~LOC~~ SOLN
10.0000 [IU] | Freq: Once | SUBCUTANEOUS | Status: AC
  Administered 2017-02-11: 10 [IU] via INTRAVENOUS
  Filled 2017-02-11: qty 1

## 2017-02-11 NOTE — ED Provider Notes (Signed)
[facility] Emergency Department Provider Note  Time seen: 6:11 PM  I have reviewed the triage vital signs and the nursing notes.   HISTORY  Chief Complaint Hyperglycemia    HPI Brenda Beasley is a 66 y.o. female With a past medical history of COPD, gastric reflux, diabetes, hypertension, presents to the emergency department for an elevated blood sugar. According to the patient over the past one week since starting prednisone her blood sugars have been persistently elevated. States they have been reading "high" today so the patient came to the emergency department for evaluation. Patient states today was her last day of prednisone. She was placed on approximately one week ago for asthma exacerbation/bronchitis. Patient states her breathing has improved somewhat. Denies any chest pain, nausea, vomiting, diarrhea rate states urinary frequency but denies dysuria.  Past Medical History:  Diagnosis Date  . COPD (chronic obstructive pulmonary disease) ([place])   . Diabetes mellitus without complication ([place])   . GERD (gastroesophageal reflux disease)   . Hyperlipidemia   . Hypertension   . IBS (irritable bowel syndrome)   . Personal history of tobacco use, presenting hazards to health 09/20/2015  . Stroke ([place])   . Tobacco abuse 10/04/2014  . Vitamin B 12 deficiency     Patient Active Problem List   Diagnosis Date Noted  . Personal history of tobacco use, presenting hazards to health 09/20/2015  . Tobacco abuse 10/04/2014    Past Surgical History:  Procedure Laterality Date  . PARTIAL COLECTOMY    . TONSILLECTOMY    . VENTRAL HERNIA REPAIR      Prior to Admission medications   Medication Sig Start Date End Date Taking? Authorizing Provider  albuterol (PROAIR HFA) 108 (90 Base) MCG/ACT inhaler Inhale 2 puffs into the lungs every 6 (six) hours as needed. 03/05/15   [provider]  amLODipine (NORVASC) 5 MG tablet Take 5 mg by mouth daily. 03/05/15    [provider]  aspirin EC 81 MG tablet Take 81 mg by mouth daily.    [provider]  azelastine (ASTELIN) 0.1 % nasal spray Place 1 spray into the nose 2 (two) times daily. 09/03/15   [provider]  Cholecalciferol (VITAMIN D3) 2000 units capsule Take 2 capsules by mouth daily.    [provider]  cyanocobalamin (,VITAMIN B-12,) 1000 MCG/ML injection Inject 1,000 mcg into the muscle every 30 (thirty) days. 10/23/14   [provider]  diclofenac sodium (VOLTAREN) 1 % GEL Apply 2 g topically 2 (two) times daily as needed.    [provider]  ferrous sulfate 325 (65 FE) MG tablet Take 325 mg by mouth daily. 03/15/15   [provider]  fluticasone (FLONASE) 50 MCG/ACT nasal spray Place 2 sprays into the nose daily. 03/05/15   [provider]  gabapentin (NEURONTIN) 100 MG capsule Take 2 capsules by mouth at bedtime. 03/05/15   [provider]  glipiZIDE (GLUCOTROL) 10 MG tablet Take 2 tablets by mouth 2 (two) times daily. 03/15/15   [provider]  hydrochlorothiazide (HYDRODIURIL) 25 MG tablet Take 25 mg by mouth daily. 03/05/15   [provider]  lisinopril (PRINIVIL,ZESTRIL) 40 MG tablet Take 40 mg by mouth daily. 03/05/15   [provider]  lovastatin (MEVACOR) 40 MG tablet Take 2 tablets by mouth daily. 03/05/15   [provider]  metFORMIN (GLUCOPHAGE) 1000 MG tablet Take 1,000 mg by mouth 2 (two) times daily. 03/05/15   [provider]  omeprazole (PRILOSEC) 20 MG capsule Take 20 mg by mouth 2 (two) times daily. 03/05/15   [provider]    Allergies  Allergen Reactions  . Other Shortness Of Breath and Hives    Cats hives Dust causes sneezing Affects breathing Tape-unknown  . Penicillins Hives and Swelling    Throat swell  . Pioglitazone Other (See Comments) and Palpitations    Chest pain, numbness in fingers,   . Byetta 10 Mcg Pen [Exenatide] Other  (See Comments)    Severe stomach pains  . Victoza [Liraglutide] Other (See Comments)    Severe stomach pains  . Sulfa Antibiotics Rash    Family History  Problem Relation Age of Onset  . Diabetes Mother   . Diabetes Father   . Diabetes Sister   . Diabetes Maternal Grandmother   . Diabetes Paternal Grandmother     Social History Social History  Substance Use Topics  . Smoking status: Current Every Day Smoker    Packs/day: 0.50    Years: 52.00    Types: Cigarettes  . Smokeless tobacco: Not on file  . Alcohol use No    Review of Systems Constitutional: Negative for fever. Cardiovascular: Negative for chest pain. Respiratory: Negative for shortness of breath. Gastrointestinal: Negative for abdominal pain, vomiting and diarrhea. Genitourinary: Negative for dysuria.positive for urinary frequency Musculoskeletal: Negative for back pain. All other ROS negative  ____________________________________________   PHYSICAL EXAM:  VITAL SIGNS: ED Triage Vitals  Enc Vitals Group     BP 02/11/17 1731 (!) 153/67     Pulse Rate 02/11/17 1731 62     Resp 02/11/17 1731 18     Temp 02/11/17 1731 98 F (36.7 C)     Temp Source 02/11/17 1731 Oral     SpO2 02/11/17 1731 96 %     Weight 02/11/17 1732 156 lb (70.8 kg)     Height 02/11/17 1732 4\' 11"  (1.499 m)     Head Circumference --      Peak Flow --      Pain Score 02/11/17 1731 6     Pain Loc --      Pain Edu? --      Excl. in [place]? --     Constitutional: Alert and oriented. Well appearing and in no distress. Eyes: Normal exam ENT   Head: Normocephalic and atraumatic.   Mouth/Throat: Mucous membranes are moist. Cardiovascular: Normal rate, regular rhythm. No murmur Respiratory: Normal respiratory effort without tachypnea nor retractions. Breath sounds are clear  Gastrointestinal: Soft and nontender. No distention. Musculoskeletal: Nontender with normal range of motion in all extremities.  Neurologic:  Normal speech  and language. No gross focal neurologic deficits  Skin:  Skin is warm, dry and intact.  Psychiatric: Mood and affect are normal.   ____________________________________________   INITIAL IMPRESSION / ASSESSMENT AND PLAN / ED COURSE  Pertinent labs & imaging results that were available during my care of the patient were reviewed by me and considered in my medical decision making (see chart for details).  patient presents to the emergency department for elevated blood sugars.patient states today she was having cramping in her feet, she took her blood sugar and it read high which prompted today's ER visit. Differential this time would include dehydration, hyperglycemia, HHS, DKA, muscle cramp. We will check labs and closely monitor in the emergency department. Overall the patient appears well, no complaints at this time. Denies any leg pain or cramping. Nontender abdomen, Junious Silk negative review of systems. We  will begin IV hydration while awaiting lab results. Patient agreeable to this plan. The patient's course of prednisone is completed, her last pill was this morning.  patient's labs have resulted showing a glucose of 520, normal anion gap. Urinalysis shows 6-30 WBCs but no bacteria, nitrite negative. Patient has no urinary symptoms we will hold off on treatment at this time. Urine culture has been added on.  patient's blood sugar has normalized down to 200s. Patient continues to appear very well in the emergency department. Patient appears to adhere to her medication regimen, I suspect the hyperglycemia is due to the recent prednisone use, her last dose was this morning. We will discharge the patient home with PCP follow-up. ____________________________________________   FINAL CLINICAL IMPRESSION(S) / ED DIAGNOSES  hyperglycemia    Harvest Dark, MD 02/11/17 2030

## 2017-02-11 NOTE — ED Notes (Signed)
fsbs 230.  md aware.  Pt alert.

## 2017-02-11 NOTE — ED Notes (Addendum)
Pt reports high blood sugar today  Pt taking prednisone for 8 days for bronchitis.  Pt has bilateral feet pain while walking today.  Pt alert  Speech clear.

## 2017-02-11 NOTE — ED Notes (Signed)
Spoke with CDS again, laura beire.

## 2017-02-11 NOTE — ED Triage Notes (Signed)
Pt reports elevated blood sugar today. Pt states her meter read high. Pt reports has been on prednisone for bronchitis. Pt CBG in triage 512. Pt reports takes oral medications for diabetes, denies using insulin. Pt alert and oriented in triage. Pt was out shopping when she realized her blood sugar was high.

## 2017-02-11 NOTE — ED Notes (Signed)
D/c inst to pt and family.  Pt alert.  Iv dc'ed.

## 2017-02-11 NOTE — ED Notes (Signed)
Spoke with laura beire  Referral number [phone] with CDS.

## 2017-02-14 LAB — URINE CULTURE

## 2017-04-02 ENCOUNTER — Other Ambulatory Visit: Payer: Self-pay

## 2017-04-02 ENCOUNTER — Emergency Department: Payer: Medicare Other

## 2017-04-02 ENCOUNTER — Inpatient Hospital Stay
Admission: EM | Admit: 2017-04-02 | Discharge: 2017-04-05 | DRG: 372 | Disposition: A | Discharge status: Home / Self Care | Payer: Medicare Other | Attending: Internal Medicine | Admitting: Internal Medicine

## 2017-04-02 ENCOUNTER — Encounter: Payer: Self-pay | Admitting: Emergency Medicine

## 2017-04-02 DIAGNOSIS — K921 Melena: Secondary | ICD-10-CM | POA: Diagnosis present

## 2017-04-02 DIAGNOSIS — R162 Hepatomegaly with splenomegaly, not elsewhere classified: Secondary | ICD-10-CM | POA: Diagnosis present

## 2017-04-02 DIAGNOSIS — J449 Chronic obstructive pulmonary disease, unspecified: Secondary | ICD-10-CM | POA: Diagnosis present

## 2017-04-02 DIAGNOSIS — K5289 Other specified noninfective gastroenteritis and colitis: Secondary | ICD-10-CM | POA: Diagnosis present

## 2017-04-02 DIAGNOSIS — E876 Hypokalemia: Secondary | ICD-10-CM | POA: Diagnosis present

## 2017-04-02 DIAGNOSIS — E785 Hyperlipidemia, unspecified: Secondary | ICD-10-CM | POA: Diagnosis present

## 2017-04-02 DIAGNOSIS — E669 Obesity, unspecified: Secondary | ICD-10-CM | POA: Diagnosis present

## 2017-04-02 DIAGNOSIS — K219 Gastro-esophageal reflux disease without esophagitis: Secondary | ICD-10-CM | POA: Diagnosis present

## 2017-04-02 DIAGNOSIS — N179 Acute kidney failure, unspecified: Secondary | ICD-10-CM | POA: Diagnosis present

## 2017-04-02 DIAGNOSIS — N281 Cyst of kidney, acquired: Secondary | ICD-10-CM | POA: Diagnosis present

## 2017-04-02 DIAGNOSIS — E119 Type 2 diabetes mellitus without complications: Secondary | ICD-10-CM | POA: Diagnosis present

## 2017-04-02 DIAGNOSIS — F17210 Nicotine dependence, cigarettes, uncomplicated: Secondary | ICD-10-CM | POA: Diagnosis present

## 2017-04-02 DIAGNOSIS — K5730 Diverticulosis of large intestine without perforation or abscess without bleeding: Secondary | ICD-10-CM | POA: Diagnosis present

## 2017-04-02 DIAGNOSIS — K589 Irritable bowel syndrome without diarrhea: Secondary | ICD-10-CM | POA: Diagnosis present

## 2017-04-02 DIAGNOSIS — Z7982 Long term (current) use of aspirin: Secondary | ICD-10-CM | POA: Diagnosis not present

## 2017-04-02 DIAGNOSIS — Z91048 Other nonmedicinal substance allergy status: Secondary | ICD-10-CM

## 2017-04-02 DIAGNOSIS — A029 Salmonella infection, unspecified: Secondary | ICD-10-CM | POA: Diagnosis not present

## 2017-04-02 DIAGNOSIS — Z7983 Long term (current) use of bisphosphonates: Secondary | ICD-10-CM | POA: Diagnosis not present

## 2017-04-02 DIAGNOSIS — Z888 Allergy status to other drugs, medicaments and biological substances status: Secondary | ICD-10-CM

## 2017-04-02 DIAGNOSIS — E538 Deficiency of other specified B group vitamins: Secondary | ICD-10-CM | POA: Diagnosis present

## 2017-04-02 DIAGNOSIS — Z9049 Acquired absence of other specified parts of digestive tract: Secondary | ICD-10-CM

## 2017-04-02 DIAGNOSIS — Z880 Allergy status to penicillin: Secondary | ICD-10-CM

## 2017-04-02 DIAGNOSIS — A020 Salmonella enteritis: Secondary | ICD-10-CM | POA: Diagnosis present

## 2017-04-02 DIAGNOSIS — Z23 Encounter for immunization: Secondary | ICD-10-CM | POA: Diagnosis present

## 2017-04-02 DIAGNOSIS — Z833 Family history of diabetes mellitus: Secondary | ICD-10-CM

## 2017-04-02 DIAGNOSIS — M47814 Spondylosis without myelopathy or radiculopathy, thoracic region: Secondary | ICD-10-CM | POA: Diagnosis present

## 2017-04-02 DIAGNOSIS — K529 Noninfective gastroenteritis and colitis, unspecified: Secondary | ICD-10-CM | POA: Diagnosis present

## 2017-04-02 DIAGNOSIS — Z9109 Other allergy status, other than to drugs and biological substances: Secondary | ICD-10-CM

## 2017-04-02 DIAGNOSIS — K760 Fatty (change of) liver, not elsewhere classified: Secondary | ICD-10-CM | POA: Diagnosis present

## 2017-04-02 DIAGNOSIS — E860 Dehydration: Secondary | ICD-10-CM | POA: Diagnosis present

## 2017-04-02 DIAGNOSIS — R197 Diarrhea, unspecified: Secondary | ICD-10-CM

## 2017-04-02 DIAGNOSIS — Z8673 Personal history of transient ischemic attack (TIA), and cerebral infarction without residual deficits: Secondary | ICD-10-CM

## 2017-04-02 DIAGNOSIS — Z885 Allergy status to narcotic agent status: Secondary | ICD-10-CM

## 2017-04-02 DIAGNOSIS — Z882 Allergy status to sulfonamides status: Secondary | ICD-10-CM

## 2017-04-02 DIAGNOSIS — I10 Essential (primary) hypertension: Secondary | ICD-10-CM | POA: Diagnosis present

## 2017-04-02 DIAGNOSIS — Z6830 Body mass index (BMI) 30.0-30.9, adult: Secondary | ICD-10-CM

## 2017-04-02 DIAGNOSIS — D259 Leiomyoma of uterus, unspecified: Secondary | ICD-10-CM | POA: Diagnosis present

## 2017-04-02 DIAGNOSIS — Z7951 Long term (current) use of inhaled steroids: Secondary | ICD-10-CM

## 2017-04-02 LAB — GASTROINTESTINAL PANEL BY PCR, STOOL (REPLACES STOOL CULTURE)
ADENOVIRUS F40/41: NOT DETECTED
Astrovirus: NOT DETECTED
CRYPTOSPORIDIUM: NOT DETECTED
CYCLOSPORA CAYETANENSIS: NOT DETECTED
Campylobacter species: NOT DETECTED
ENTEROAGGREGATIVE E COLI (EAEC): NOT DETECTED
Entamoeba histolytica: NOT DETECTED
Enteropathogenic E coli (EPEC): NOT DETECTED
Enterotoxigenic E coli (ETEC): NOT DETECTED
GIARDIA LAMBLIA: NOT DETECTED
Norovirus GI/GII: NOT DETECTED
Plesimonas shigelloides: NOT DETECTED
ROTAVIRUS A: NOT DETECTED
SALMONELLA SPECIES: DETECTED — AB
SHIGELLA/ENTEROINVASIVE E COLI (EIEC): NOT DETECTED
Sapovirus (I, II, IV, and V): NOT DETECTED
Shiga like toxin producing E coli (STEC): NOT DETECTED
VIBRIO SPECIES: NOT DETECTED
Vibrio cholerae: NOT DETECTED
YERSINIA ENTEROCOLITICA: NOT DETECTED

## 2017-04-02 LAB — URINALYSIS, COMPLETE (UACMP) WITH MICROSCOPIC
Bacteria, UA: NONE SEEN
Bilirubin Urine: NEGATIVE
GLUCOSE, UA: NEGATIVE mg/dL
Ketones, ur: NEGATIVE mg/dL
LEUKOCYTES UA: NEGATIVE
NITRITE: NEGATIVE
PH: 5 (ref 5.0–8.0)
PROTEIN: 30 mg/dL — AB
SPECIFIC GRAVITY, URINE: 1.023 (ref 1.005–1.030)

## 2017-04-02 LAB — COMPREHENSIVE METABOLIC PANEL
ALK PHOS: 73 U/L (ref 38–126)
ALT: 11 U/L — ABNORMAL LOW (ref 14–54)
ANION GAP: 16 — AB (ref 5–15)
AST: 22 U/L (ref 15–41)
Albumin: 3.3 g/dL — ABNORMAL LOW (ref 3.5–5.0)
BILIRUBIN TOTAL: 0.6 mg/dL (ref 0.3–1.2)
BUN: 33 mg/dL — ABNORMAL HIGH (ref 6–20)
CO2: 21 mmol/L — ABNORMAL LOW (ref 22–32)
Calcium: 8.7 mg/dL — ABNORMAL LOW (ref 8.9–10.3)
Chloride: 94 mmol/L — ABNORMAL LOW (ref 101–111)
Creatinine, Ser: 1.72 mg/dL — ABNORMAL HIGH (ref 0.44–1.00)
GFR calc non Af Amer: 30 mL/min — ABNORMAL LOW (ref >60)
GFR, EST AFRICAN AMERICAN: 34 mL/min — AB (ref >60)
Glucose, Bld: 174 mg/dL — ABNORMAL HIGH (ref 65–99)
POTASSIUM: 2.8 mmol/L — AB (ref 3.5–5.1)
SODIUM: 131 mmol/L — AB (ref 135–145)
TOTAL PROTEIN: 7.1 g/dL (ref 6.5–8.1)

## 2017-04-02 LAB — LACTIC ACID, PLASMA
LACTIC ACID, VENOUS: 1.5 mmol/L (ref 0.5–1.9)
Lactic Acid, Venous: 2.7 mmol/L (ref 0.5–1.9)

## 2017-04-02 LAB — CBC
HEMATOCRIT: 37.2 % (ref 35.0–47.0)
Hemoglobin: 12.2 g/dL (ref 12.0–16.0)
MCH: 26.1 pg (ref 26.0–34.0)
MCHC: 32.9 g/dL (ref 32.0–36.0)
MCV: 79.4 fL — ABNORMAL LOW (ref 80.0–100.0)
PLATELETS: 257 10*3/uL (ref 150–440)
RBC: 4.68 MIL/uL (ref 3.80–5.20)
RDW: 17 % — AB (ref 11.5–14.5)
WBC: 10.2 10*3/uL (ref 3.6–11.0)

## 2017-04-02 LAB — GLUCOSE, CAPILLARY: Glucose-Capillary: 137 mg/dL — ABNORMAL HIGH (ref 65–99)

## 2017-04-02 LAB — LIPASE, BLOOD: Lipase: 29 U/L (ref 11–51)

## 2017-04-02 MED ORDER — ACETAMINOPHEN 650 MG RE SUPP
650.0000 mg | Freq: Four times a day (QID) | RECTAL | Status: DC | PRN
Start: 2017-04-02 — End: 2017-04-05

## 2017-04-02 MED ORDER — ACETAMINOPHEN 325 MG PO TABS: 650.0000 mg | ORAL_TABLET | Freq: Four times a day (QID) | ORAL | Status: DC | PRN

## 2017-04-02 MED ORDER — ONDANSETRON HCL 4 MG PO TABS: 4.0000 mg | ORAL_TABLET | Freq: Four times a day (QID) | ORAL | Status: DC | PRN

## 2017-04-02 MED ORDER — CIPROFLOXACIN IN D5W 400 MG/200ML IV SOLN: 400.0000 mg | Freq: Two times a day (BID) | INTRAVENOUS | Status: DC

## 2017-04-02 MED ORDER — POTASSIUM CHLORIDE IN NACL 40-0.9 MEQ/L-% IV SOLN
INTRAVENOUS | Status: DC
  Administered 2017-04-03 – 2017-04-04 (×4): 125 mL/h via INTRAVENOUS
  Filled 2017-04-02 (×7): qty 1000

## 2017-04-02 MED ORDER — METRONIDAZOLE IN NACL 5-0.79 MG/ML-% IV SOLN: 500.0000 mg | Freq: Once | INTRAVENOUS | Status: DC

## 2017-04-02 MED ORDER — INFLUENZA VAC SPLIT HIGH-DOSE 0.5 ML IM SUSY
0.5000 mL | PREFILLED_SYRINGE | INTRAMUSCULAR | Status: AC
Start: 2017-04-03 — End: 2017-04-05
  Administered 2017-04-05: 12:00:00 0.5 mL via INTRAMUSCULAR
  Filled 2017-04-02 (×2): qty 0.5

## 2017-04-02 MED ORDER — MAGNESIUM SULFATE 2 GM/50ML IV SOLN
2.0000 g | Freq: Once | INTRAVENOUS | Status: AC
  Administered 2017-04-02: 2 g via INTRAVENOUS
  Filled 2017-04-02: qty 50

## 2017-04-02 MED ORDER — METRONIDAZOLE IN NACL 5-0.79 MG/ML-% IV SOLN
500.0000 mg | Freq: Three times a day (TID) | INTRAVENOUS | Status: DC
  Administered 2017-04-03 – 2017-04-04 (×2): 500 mg via INTRAVENOUS
  Filled 2017-04-02 (×4): qty 100

## 2017-04-02 MED ORDER — IOPAMIDOL (ISOVUE-300) INJECTION 61%
75.0000 mL | Freq: Once | INTRAVENOUS | Status: AC | PRN
  Administered 2017-04-02: 75 mL via INTRAVENOUS

## 2017-04-02 MED ORDER — SODIUM CHLORIDE 0.9 % IV BOLUS (SEPSIS)
1000.0000 mL | Freq: Once | INTRAVENOUS | Status: AC
  Administered 2017-04-02: 1000 mL via INTRAVENOUS

## 2017-04-02 MED ORDER — LEVOFLOXACIN IN D5W 750 MG/150ML IV SOLN
750.0000 mg | Freq: Once | INTRAVENOUS | Status: AC
  Administered 2017-04-02: 750 mg via INTRAVENOUS
  Filled 2017-04-02: qty 150

## 2017-04-02 MED ORDER — CIPROFLOXACIN IN D5W 400 MG/200ML IV SOLN
400.0000 mg | Freq: Two times a day (BID) | INTRAVENOUS | Status: DC
  Administered 2017-04-03 – 2017-04-05 (×4): 400 mg via INTRAVENOUS
  Filled 2017-04-02 (×5): qty 200

## 2017-04-02 MED ORDER — INSULIN ASPART 100 UNIT/ML ~~LOC~~ SOLN
0.0000 [IU] | Freq: Three times a day (TID) | SUBCUTANEOUS | Status: DC
  Administered 2017-04-03 (×3): 1 [IU] via SUBCUTANEOUS
  Administered 2017-04-04 (×2): 2 [IU] via SUBCUTANEOUS
  Administered 2017-04-04: 13:00:00 3 [IU] via SUBCUTANEOUS
  Administered 2017-04-05: 12:00:00 2 [IU] via SUBCUTANEOUS
  Administered 2017-04-05: 09:00:00 3 [IU] via SUBCUTANEOUS
  Filled 2017-04-02 (×8): qty 1

## 2017-04-02 MED ORDER — PNEUMOCOCCAL VAC POLYVALENT 25 MCG/0.5ML IJ INJ
0.5000 mL | INJECTION | INTRAMUSCULAR | Status: AC
  Administered 2017-04-03: 12:00:00 0.5 mL via INTRAMUSCULAR
  Filled 2017-04-02: qty 0.5

## 2017-04-02 MED ORDER — POTASSIUM CHLORIDE CRYS ER 20 MEQ PO TBCR
80.0000 meq | EXTENDED_RELEASE_TABLET | Freq: Once | ORAL | Status: AC
  Administered 2017-04-02: 80 meq via ORAL
  Filled 2017-04-02: qty 4

## 2017-04-02 MED ORDER — ONDANSETRON HCL 4 MG/2ML IJ SOLN: 4.0000 mg | Freq: Four times a day (QID) | INTRAMUSCULAR | Status: DC | PRN

## 2017-04-02 NOTE — ED Triage Notes (Signed)
Arrives from PCP office for ED evaluation of diarrhea and rectal bleeding.  Patient states she has had diarrhea x 2 weeks and noticed blood in stool yesterday.

## 2017-04-02 NOTE — ED Provider Notes (Signed)
[facility] Emergency Department Provider Note  ____________________________________________   First MD Initiated Contact with Patient 04/02/17 1607     (approximate)  I have reviewed the triage vital signs and the nursing notes.   HISTORY  Chief Complaint Diarrhea and Rectal Bleeding    HPI Brenda Beasley is a 66 y.o. female who sent to the emergency department from her primary care physician's office for hypotension to 80s over 60s along with profuse watery diarrhea.  The patient reports going 10-15 times a day.  She has had no recent antibiotics.  No recent travel.  She has not drunk from any stream water.  She does have a remote history of partial colectomy secondary to polyps.  Her last colonoscopy was roughly 7 years ago.  Her symptoms began insidiously and have been constant ever since.  Nothing seems to make them better or worse.  Past Medical History:  Diagnosis Date  . COPD (chronic obstructive pulmonary disease) ([place])   . Diabetes mellitus without complication ([place])   . GERD (gastroesophageal reflux disease)   . Hyperlipidemia   . Hypertension   . IBS (irritable bowel syndrome)   . Personal history of tobacco use, presenting hazards to health 09/20/2015  . Stroke ([place])   . Tobacco abuse 10/04/2014  . Vitamin B 12 deficiency     Patient Active Problem List   Diagnosis Date Noted  . Colitis 04/02/2017  . Personal history of tobacco use, presenting hazards to health 09/20/2015  . Tobacco abuse 10/04/2014    Past Surgical History:  Procedure Laterality Date  . PARTIAL COLECTOMY    . TONSILLECTOMY    . VENTRAL HERNIA REPAIR      Prior to Admission medications   Medication Sig Start Date End Date Taking? Authorizing Provider  albuterol (PROAIR HFA) 108 (90 Base) MCG/ACT inhaler Inhale 2 puffs into the lungs every 6 (six) hours as needed. 03/05/15  Yes [provider]  alendronate (FOSAMAX) 70 MG tablet Take 70 mg once a week by  mouth. Take with a full glass of water on an empty stomach.   Yes [provider]  amLODipine (NORVASC) 5 MG tablet Take 5 mg by mouth daily. 03/05/15  Yes [provider]  aspirin EC 81 MG tablet Take 81 mg by mouth daily.   Yes [provider]  azelastine (ASTELIN) 0.1 % nasal spray Place 1 spray into the nose 2 (two) times daily. 09/03/15  Yes [provider]  Cholecalciferol (VITAMIN D3) 2000 units capsule Take 2 capsules by mouth daily.   Yes [provider]  cyanocobalamin (,VITAMIN B-12,) 1000 MCG/ML injection Inject 1,000 mcg into the muscle every 30 (thirty) days. 10/23/14  Yes [provider]  ferrous sulfate 325 (65 FE) MG tablet Take 325 mg by mouth daily. 03/15/15  Yes [provider]  fluticasone (FLONASE) 50 MCG/ACT nasal spray Place 2 sprays into the nose daily. 03/05/15  Yes [provider]  gabapentin (NEURONTIN) 100 MG capsule Take 200 mg at bedtime by mouth.   Yes [provider]  glipiZIDE (GLUCOTROL) 10 MG tablet Take 20 mg 2 (two) times daily by mouth.  03/15/15  Yes [provider]  hydrochlorothiazide (HYDRODIURIL) 25 MG tablet Take 25 mg by mouth daily. 03/05/15  Yes [provider]  ipratropium-albuterol (DUONEB) 0.5-2.5 (3) MG/3ML SOLN Take 3 mLs 4 (four) times daily as needed by nebulization.   Yes [provider]  lisinopril (PRINIVIL,ZESTRIL) 40 MG tablet Take 40 mg  by mouth daily. 03/05/15  Yes [provider]  lovastatin (MEVACOR) 40 MG tablet Take 2 tablets by mouth daily. 03/05/15  Yes [provider]  MAGNESIUM PO Take daily by mouth.   Yes [provider]  omeprazole (PRILOSEC) 20 MG capsule Take 20 mg by mouth 2 (two) times daily. 03/05/15  Yes [provider]  sitaGLIPtin (JANUVIA) 50 MG tablet Take 50 mg daily by mouth.   Yes [provider]  TRADJENTA 5 MG TABS tablet Take 1 tablet by mouth daily. 02/05/17  Yes  [provider]  benzonatate (TESSALON) 200 MG capsule Take 1 capsule by mouth 3 (three) times daily. 02/03/17   [provider]  metFORMIN (GLUCOPHAGE) 1000 MG tablet Take 1,000 mg by mouth 2 (two) times daily. 03/05/15   [provider]  predniSONE (DELTASONE) 10 MG tablet Take 1 tablet by mouth taper from 4 doses each day to 1 dose and stop. 02/03/17   [provider]    Allergies Other; Penicillins; Pioglitazone; Byetta 10 mcg pen [exenatide]; Victoza [liraglutide]; and Sulfa antibiotics  Family History  Problem Relation Age of Onset  . Diabetes Mother   . Diabetes Father   . Diabetes Sister   . Diabetes Maternal Grandmother   . Diabetes Paternal Grandmother     Social History Social History   Tobacco Use  . Smoking status: Current Every Day Smoker    Packs/day: 0.50    Years: 52.00    Pack years: 26.00    Types: Cigarettes  . Smokeless tobacco: Never Used  Substance Use Topics  . Alcohol use: No    Alcohol/week: 0.0 oz  . Drug use: Not on file    Review of Systems Constitutional: No fever/chills Eyes: No visual changes. ENT: No sore throat. Cardiovascular: Denies chest pain. Respiratory: Denies shortness of breath. Gastrointestinal: Positive for abdominal pain.  No nausea, no vomiting.  Positive for diarrhea.  No constipation. Genitourinary: Negative for dysuria. Musculoskeletal: Negative for back pain. Skin: Negative for rash. Neurological: Negative for headaches, focal weakness or numbness.   ____________________________________________   PHYSICAL EXAM:  VITAL SIGNS: ED Triage Vitals  Enc Vitals Group     BP 04/02/17 1249 (!) 101/55     Pulse Rate 04/02/17 1249 68     Resp 04/02/17 1249 16     Temp 04/02/17 1249 97.6 F (36.4 C)     Temp Source 04/02/17 1249 Oral     SpO2 04/02/17 1249 96 %     Weight 04/02/17 1247 148 lb (67.1 kg)     Height 04/02/17 1247 4\' 11"  (1.499 m)     Head Circumference --      Peak  Flow --      Pain Score 04/02/17 1246 5     Pain Loc --      Pain Edu? --      Excl. in [place]? --     Constitutional: Alert and oriented x4 nontoxic no diaphoresis speaks in full clear sentences although clearly uncomfortable Eyes: PERRL EOMI. Head: Atraumatic. Nose: No congestion/rhinnorhea. Mouth/Throat: No trismus Neck: No stridor.   Cardiovascular: Normal rate, regular rhythm. Grossly normal heart sounds.  Good peripheral circulation. Respiratory: Normal respiratory effort.  No retractions. Lungs CTAB and moving good air Gastrointestinal: No frank peritonitis on her abdomen but significant diffuse tenderness with no focality no McBurney's tenderness Musculoskeletal: No lower extremity edema   Neurologic:  Normal speech and language. No gross focal neurologic deficits are appreciated. Skin:  Skin is  warm, dry and intact. No rash noted. Psychiatric: Mood and affect are normal. Speech and behavior are normal.    ____________________________________________   DIFFERENTIAL includes but not limited to  Infectious diarrhea, inflammatory diarrhea, Crohn's disease, ulcerative colitis, colitis ____________________________________________   LABS (all labs ordered are listed, but only abnormal results are displayed)  Labs Reviewed  GASTROINTESTINAL PANEL BY PCR, STOOL (REPLACES STOOL CULTURE) - Abnormal; Notable for the following components:      Result Value   Salmonella species DETECTED (*)    All other components within normal limits  COMPREHENSIVE METABOLIC PANEL - Abnormal; Notable for the following components:   Sodium 131 (*)    Potassium 2.8 (*)    Chloride 94 (*)    CO2 21 (*)    Glucose, Bld 174 (*)    BUN 33 (*)    Creatinine, Ser 1.72 (*)    Calcium 8.7 (*)    Albumin 3.3 (*)    ALT 11 (*)    GFR calc non Af Amer 30 (*)    GFR calc Af Amer 34 (*)    Anion gap 16 (*)    All other components within normal limits  CBC - Abnormal; Notable for the following  components:   MCV 79.4 (*)    RDW 17.0 (*)    All other components within normal limits  URINALYSIS, COMPLETE (UACMP) WITH MICROSCOPIC - Abnormal; Notable for the following components:   Color, Urine YELLOW (*)    APPearance HAZY (*)    Hgb urine dipstick SMALL (*)    Protein, ur 30 (*)    Squamous Epithelial / LPF 0-5 (*)    All other components within normal limits  LACTIC ACID, PLASMA - Abnormal; Notable for the following components:   Lactic Acid, Venous 2.7 (*)    All other components within normal limits  GLUCOSE, CAPILLARY - Abnormal; Notable for the following components:   Glucose-Capillary 137 (*)    All other components within normal limits  CULTURE, BLOOD (ROUTINE X 2)  CULTURE, BLOOD (ROUTINE X 2)  C DIFFICILE QUICK SCREEN W PCR REFLEX  LIPASE, BLOOD  LACTIC ACID, PLASMA    Blood work reviewed and interpreted by me shows hyperkalemia along with the decrease in the GFR concerning for dehydration and acute kidney injury PCR is consistent with Salmonella __________________________________________  EKG  ____________________________________________  RADIOLOGY  CT scan abdomen pelvis is consistent with colitis ____________________________________________   PROCEDURES  Procedure(s) performed: no  Procedures  Critical Care performed: yes  CRITICAL CARE Performed by: Darel Hong   Total critical care time: 30 minutes  Critical care time was exclusive of separately billable procedures and treating other patients.  Critical care was necessary to treat or prevent imminent or life-threatening deterioration.  Critical care was time spent personally by me on the following activities: development of treatment plan with patient and/or surrogate as well as nursing, discussions with consultants, evaluation of patient's response to treatment, examination of patient, obtaining history from patient or surrogate, ordering and performing treatments and interventions,  ordering and review of laboratory studies, ordering and review of radiographic studies, pulse oximetry and re-evaluation of patient's condition.   Observation: no ____________________________________________   INITIAL IMPRESSION / ASSESSMENT AND PLAN / ED COURSE  Pertinent labs & imaging results that were available during my care of the patient were reviewed by me and considered in my medical decision making (see chart for details).  Patient arrives with acute kidney injury and is clearly dehydrated.  She is also hypokalemic.  Fluids, magnesium, potassium repletion pending at this time as well as a stool sample.    The patient CT scan is concerning for acute colitis.  Given her constellation of symptoms including elevated lactic acid and significant acute kidney injury the patient does require inpatient admission for IV antibiotics and IV fluid hydration.  If her PCR comes back negative she very well may require urgent colonoscopy and endoscopy for evaluation for possible inflammatory bowel disease.  The patient verbalized understanding and agreed with the plan.  I discussed the case with the hospitalist Dr. Posey Pronto who is graciously agreed to admit the patient to her service. ____________________________________________   FINAL CLINICAL IMPRESSION(S) / ED DIAGNOSES  Final diagnoses:  Dehydration  Diarrhea, unspecified type  Acute kidney injury ([place])      NEW MEDICATIONS STARTED DURING THIS VISIT:  This SmartLink is deprecated. Use AVSMEDLIST instead to display the medication list for a patient.   Note:  This document was prepared using Dragon voice recognition software and may include unintentional dictation errors.     Darel Hong, MD 04/03/17 [phone]

## 2017-04-02 NOTE — H&P (Signed)
[place] [facility] [place] NAME: Brenda Beasley    MR#:  [id number]  DATE OF BIRTH:  [birth year]  DATE OF ADMISSION:  04/02/2017  PRIMARY CARE PHYSICIAN: Adin Hector, MD   REQUESTING/REFERRING PHYSICIAN: Dr Mable Paris  CHIEF COMPLAINT:  Diarrhea for 12 days Blood in stool today  HISTORY OF PRESENT ILLNESS:  Brenda Beasley  is a 66 y.o. female with a known history of COPD, diabetes, GERD, history of irritable bowel syndrome comes to the emergency room for with diarrhea on and off for last 12 days.  Patient said she is up to 12-14 runny watery stools per day.  She has not been able to eat much since she is worried she is going to have diarrhea.  She was seen 2 times at the urgent care and symptomatic management was provided.  She went to see her primary care physician today because she noticed bright red blood per rectum today. Stool studies with C. difficile were sent from Dr. Olin Pia office patient also is hypokalemic and found to be in acute renal failure with dehydration.  Patient was sent to the emergency room where workup in the form of CT scan showed colitis.  Patient was found to have acute renal failure with hyperkalemia.  She is being admitted for further evaluation and management.  PAST MEDICAL HISTORY:   Past Medical History:  Diagnosis Date  . COPD (chronic obstructive pulmonary disease) ([place])   . Diabetes mellitus without complication ([place])   . GERD (gastroesophageal reflux disease)   . Hyperlipidemia   . Hypertension   . IBS (irritable bowel syndrome)   . Personal history of tobacco use, presenting hazards to health 09/20/2015  . Stroke ([place])   . Tobacco abuse 10/04/2014  . Vitamin B 12 deficiency     PAST SURGICAL HISTOIRY:   Past Surgical History:  Procedure Laterality Date  . PARTIAL COLECTOMY    . TONSILLECTOMY    . VENTRAL HERNIA REPAIR      SOCIAL HISTORY:   Social History   Tobacco Use  . Smoking  status: Current Every Day Smoker    Packs/day: 0.50    Years: 52.00    Pack years: 26.00    Types: Cigarettes  . Smokeless tobacco: Never Used  Substance Use Topics  . Alcohol use: No    Alcohol/week: 0.0 oz    FAMILY HISTORY:   Family History  Problem Relation Age of Onset  . Diabetes Mother   . Diabetes Father   . Diabetes Sister   . Diabetes Maternal Grandmother   . Diabetes Paternal Grandmother     DRUG ALLERGIES:   Allergies  Allergen Reactions  . Other Shortness Of Breath and Hives    Cats hives Dust causes sneezing Affects breathing Tape-unknown  . Penicillins Hives and Swelling    Throat swell  . Pioglitazone Other (See Comments) and Palpitations    Chest pain, numbness in fingers,   . Byetta 10 Mcg Pen [Exenatide] Other (See Comments)    Severe stomach pains  . Victoza [Liraglutide] Other (See Comments)    Severe stomach pains  . Sulfa Antibiotics Rash    REVIEW OF SYSTEMS:  Review of Systems  Constitutional: Negative for chills, fever and weight loss.  HENT: Negative for ear discharge, ear pain and nosebleeds.   Eyes: Negative for blurred vision, pain and discharge.  Respiratory: Negative for sputum production, shortness of breath, wheezing and stridor.   Cardiovascular: Negative  for chest pain, palpitations, orthopnea and PND.  Gastrointestinal: Positive for diarrhea. Negative for abdominal pain, nausea and vomiting.  Genitourinary: Negative for frequency and urgency.  Musculoskeletal: Negative for back pain and joint pain.  Neurological: Positive for weakness. Negative for sensory change, speech change and focal weakness.  Psychiatric/Behavioral: Negative for depression and hallucinations. The patient is not nervous/anxious.      MEDICATIONS AT HOME:   Prior to Admission medications   Medication Sig Start Date End Date Taking? Authorizing Provider  albuterol (PROAIR HFA) 108 (90 Base) MCG/ACT inhaler Inhale 2 puffs into the lungs every 6 (six)  hours as needed. 03/05/15   [provider]  amLODipine (NORVASC) 5 MG tablet Take 5 mg by mouth daily. 03/05/15   [provider]  aspirin EC 81 MG tablet Take 81 mg by mouth daily.    [provider]  azelastine (ASTELIN) 0.1 % nasal spray Place 1 spray into the nose 2 (two) times daily. 09/03/15   [provider]  benzonatate (TESSALON) 200 MG capsule Take 1 capsule by mouth 3 (three) times daily. 02/03/17   [provider]  Cholecalciferol (VITAMIN D3) 2000 units capsule Take 2 capsules by mouth daily.    [provider]  cyanocobalamin (,VITAMIN B-12,) 1000 MCG/ML injection Inject 1,000 mcg into the muscle every 30 (thirty) days. 10/23/14   [provider]  ferrous sulfate 325 (65 FE) MG tablet Take 325 mg by mouth daily. 03/15/15   [provider]  fluticasone (FLONASE) 50 MCG/ACT nasal spray Place 2 sprays into the nose daily. 03/05/15   [provider]  glipiZIDE (GLUCOTROL) 10 MG tablet Take 20,800 tablets by mouth 2 (two) times daily.  03/15/15   [provider]  hydrochlorothiazide (HYDRODIURIL) 25 MG tablet Take 25 mg by mouth daily. 03/05/15   [provider]  lisinopril (PRINIVIL,ZESTRIL) 40 MG tablet Take 40 mg by mouth daily. 03/05/15   [provider]  lovastatin (MEVACOR) 40 MG tablet Take 2 tablets by mouth daily. 03/05/15   [provider]  metFORMIN (GLUCOPHAGE) 1000 MG tablet Take 1,000 mg by mouth 2 (two) times daily. 03/05/15   [provider]  omeprazole (PRILOSEC) 20 MG capsule Take 20 mg by mouth 2 (two) times daily. 03/05/15   [provider]  predniSONE (DELTASONE) 10 MG tablet Take 1 tablet by mouth taper from 4 doses each day to 1 dose and stop. 02/03/17   [provider]  TRADJENTA 5 MG TABS tablet Take 1 tablet by mouth daily. 02/05/17   [provider]      VITAL SIGNS:  Blood pressure (!) 101/55, pulse 68,  temperature 97.6 F (36.4 C), temperature source Oral, resp. rate 16, height 4\' 11"  (1.499 m), weight 67.1 kg (148 lb), SpO2 96 %.  PHYSICAL EXAMINATION:  GENERAL:  66 y.o.-year-old patient lying in the bed with no acute distress.appears weak  EYES: Pupils equal, round, reactive to light and accommodation. No scleral icterus. Extraocular muscles intact.  HEENT: Head atraumatic, normocephalic. Oropharynx and nasopharynx dry   NECK:  Supple, no jugular venous distention. No thyroid enlargement, no tenderness.  LUNGS: Normal breath sounds bilaterally, no wheezing, rales,rhonchi or crepitation. No use of accessory muscles of respiration.  CARDIOVASCULAR: S1, S2 normal. No murmurs, rubs, or gallops.  ABDOMEN: Soft, nontender, nondistended. Bowel sounds present. No organomegaly or mass.  EXTREMITIES: No pedal edema, cyanosis, or clubbing.  NEUROLOGIC: Cranial nerves II through XII are intact. Muscle strength 5/5 in all extremities. Sensation  intact. Gait not checked.  PSYCHIATRIC: patient is alert and oriented x 3.  SKIN: No obvious rash, lesion, or ulcer.   LABORATORY PANEL:   CBC Recent Labs  Lab 04/02/17 1245  WBC 10.2  HGB 12.2  HCT 37.2  PLT 257   ------------------------------------------------------------------------------------------------------------------  Chemistries  Recent Labs  Lab 04/02/17 1245  NA 131*  K 2.8*  CL 94*  CO2 21*  GLUCOSE 174*  BUN 33*  CREATININE 1.72*  CALCIUM 8.7*  AST 22  ALT 11*  ALKPHOS 73  BILITOT 0.6   ------------------------------------------------------------------------------------------------------------------  Cardiac Enzymes No results for input(s): TROPONINI in the last 168 hours. ------------------------------------------------------------------------------------------------------------------  RADIOLOGY:  Ct Abdomen Pelvis W Contrast  Result Date: 04/02/2017 CLINICAL DATA:  Diarrhea and rectal bleeding. EXAM: CT ABDOMEN  AND PELVIS WITH CONTRAST TECHNIQUE: Multidetector CT imaging of the abdomen and pelvis was performed using the standard protocol following bolus administration of intravenous contrast. CONTRAST:  75mL ISOVUE-300 IOPAMIDOL (ISOVUE-300) INJECTION 61% COMPARISON:  07/16/2011. FINDINGS: Lower chest: Tiny left lower lobe nodule on image number 4 series 4, without significant change. Hepatobiliary: Minimal diffuse low density of the liver relative to the spleen. The liver is mildly enlarged without significant change. Normal appearing gallbladder. Pancreas: Unremarkable. No pancreatic ductal dilatation or surrounding inflammatory changes. Spleen: Borderline enlarged without focal abnormality. Adrenals/Urinary Tract: Upper pole right renal cysts. Normal appearing left kidney, ureters, urinary bladder and adrenal glands. Stomach/Bowel: Multiple colonic diverticula. Surgically absent right colon with a small bowel to colon anastomosis at the hepatic flexure. Interval mild diffuse colonic wall thickening. Unremarkable stomach and small bowel. Vascular/Lymphatic: Atheromatous arterial calcifications without aneurysm. Mildly prominent retroperitoneal and upper abdominal lymph nodes with no abnormally enlarged nodes. Reproductive: 1.3 cm rounded enhancing mass in the uterus on the left and 1.4 cm rounded enhancing mass in the uterus on the right. No adnexal masses. Other: 4.4 x 2.8 cm area curvilinear presacral soft tissue density with interspersed fat on image number 65 series 2, previously 3.8 x 1.8 cm. This measures 3.9 cm in length on sagittal image number 78. This appears to have a thin surrounding membrane with underlying fat on the sagittal images. Musculoskeletal: Lower thoracic spine degenerative changes and minimal lumbar spine degenerative changes. IMPRESSION: 1. Interval mild diffuse colonic wall thickening, possibly due to mild colitis. 2. Colonic diverticulosis. 3. Small uterine fibroids. 4. Mild increase in  presacral curvilinear soft tissue density and fat density. This most likely represents postsurgical scarring/fat necrosis. This does not have the typical appearance of malignancy. 5. Minimal diffuse hepatic steatosis with improvement. 6. Stable mild hepatomegaly and borderline splenomegaly. Electronically Signed   By: Claudie Revering M.D.   On: 04/02/2017 17:50    EKG:    IMPRESSION AND PLAN:  Brenda Beasley  is a 66 y.o. female with a known history of COPD, diabetes, GERD, history of irritable bowel syndrome comes to the emergency room for with diarrhea on and off for last 12 days.  Patient said she is up to 12-14 runny watery stools per day.  She has not been able to eat much since she is worried she is going to have diarrhea.    1. acute colitis -Patient presented with 12 days on and off along with bloody stool noted this morning, elevated lactic acid, hypokalemia and acute renal failure -CT scan of the abdomen shows colitis -IV Cipro and Flagyl -Clear liquid diet -IV fluid  2.  Acute renal failure secondary to GI losses -IV fluids -Avoid nephrotoxins  3.  Hypokalemia -IV  And po K replacement  4. Dm-2  SSI -hold metformin due to CT contrast today  5. DVT prophylaxis Lovenox   All the records are reviewed and case discussed with ED provider. Management plans discussed with the patient, family and they are in agreement.  CODE STATUS: full  TOTAL TIME TAKING CARE OF THIS PATIENT: 50 minutes.    Fritzi Mandes M.D on 04/02/2017 at 6:31 PM  Between 7am to 6pm - Pager - [phone]  After 6pm go to [email or web address] - password [email or web address]  [facility]  Office  [phone]  CC: Primary care physician; Adin Hector, MD

## 2017-04-02 NOTE — ED Notes (Signed)
Report from kelly, rn 

## 2017-04-02 NOTE — Progress Notes (Signed)
ELECTROLYTE CONSULT NOTE - INITIAL   Pharmacy Consult for electrolyte monitoring   Allergies  Allergen Reactions  . Other Shortness Of Breath and Hives    Cats hives Dust causes sneezing Affects breathing Tape-unknown  . Penicillins Hives and Swelling    Throat swell  . Pioglitazone Other (See Comments) and Palpitations    Chest pain, numbness in fingers,   . Byetta 10 Mcg Pen [Exenatide] Other (See Comments)    Severe stomach pains  . Victoza [Liraglutide] Other (See Comments)    Severe stomach pains  . Sulfa Antibiotics Rash    Patient Measurements: Height: 4\' 11"  (149.9 cm) Weight: 148 lb (67.1 kg) IBW/kg (Calculated) : 43.2  Vital Signs: Temp: 97.6 F (36.4 C) (11/16 1249) Temp Source: Oral (11/16 1249) BP: 101/55 (11/16 1249) Pulse Rate: 68 (11/16 1249) Intake/Output from previous day: No intake/output data recorded. Intake/Output from this shift: No intake/output data recorded.  Labs: Recent Labs    04/02/17 1245  WBC 10.2  HGB 12.2  HCT 37.2  PLT 257  CREATININE 1.72*  ALBUMIN 3.3*  PROT 7.1  AST 22  ALT 11*  ALKPHOS 73  BILITOT 0.6   Estimated Creatinine Clearance: 26.5 mL/min (A) (by C-G formula based on SCr of 1.72 mg/dL (H)).   Microbiology: No results found for this or any previous visit (from the past 720 hour(s)).  Medical History: Past Medical History:  Diagnosis Date  . COPD (chronic obstructive pulmonary disease) ([place])   . Diabetes mellitus without complication ([place])   . GERD (gastroesophageal reflux disease)   . Hyperlipidemia   . Hypertension   . IBS (irritable bowel syndrome)   . Personal history of tobacco use, presenting hazards to health 09/20/2015  . Stroke ([place])   . Tobacco abuse 10/04/2014  . Vitamin B 12 deficiency    Assessment:  66 yo female presents with diarrhea on and off for last 12 days. Patient reports 12-14 runny watery stools per day.    Goal of Therapy:  Electrolytes wnl  Plan:  11/16 K 2.8: was  replaced with KCl 80 mEq PO once.  Will check BMP in AM and continue to follow and monitor electrolytes.   [place] Resident  04/02/2017,6:28 PM

## 2017-04-02 NOTE — ED Notes (Signed)
Date and time results received: 04/02/17 1800   Test: Lactic Acid Critical Value: 2.7  Name of Provider Notified: Dr. Mable Paris  Orders Received? Or Actions Taken?: Orders Received - See Orders for details

## 2017-04-02 NOTE — Progress Notes (Signed)
    Outpatient Metformin Instructions (Glucophage, Glucovance, Fortamet, Riomet, Metaglip, Glumetza, Actoplus met  Avandamet, Janumet)   Patient: Brenda Beasley                                                04/02/2017:    Radiology Exam:     As part of your exam today in the Radiology Department, you were given a radiographic contrast material or x-ray dye.  Because you have had this contrast material and you are taking a Metformin drug (Glucophage, Glucovance, Avandamet, Fortamet, Riomet, Metaglip, Glumetza, Actoplus met, Actoplus Met XR, Prandimet or Janumet), please observe the following instructions:   DO NOT  Take this medication for 48 hours after your exam.  Because you have normal renal function and have no comorbidities, you may restart your medication in 48 hours with no need for a renal function test or consultation with your physician.  You have normal renal function but have some comorbidities.  Comorbidities include liver disease, alcohol overuse, heart failure, myocardial or muscular ischemia, sepsis, or other severe infection.  Therefore you should consult your physician before restarting your medication.  You have impaired renal function.  You should consult your physician before restarting your medication and you are advised to get a renal function test before restarting your medication.  Please discuss this with your physician.   Call your doctor before you start taking this medication again.  Your doctor may want to check your kidney function before you start taking this medication again.  I understand these instructions and have had an opportunity to discuss them with Radiology Department personnel.

## 2017-04-03 LAB — BASIC METABOLIC PANEL
Anion gap: 10 (ref 5–15)
BUN: 24 mg/dL — AB (ref 6–20)
CHLORIDE: 105 mmol/L (ref 101–111)
CO2: 20 mmol/L — AB (ref 22–32)
Calcium: 8.2 mg/dL — ABNORMAL LOW (ref 8.9–10.3)
Creatinine, Ser: 1.22 mg/dL — ABNORMAL HIGH (ref 0.44–1.00)
GFR calc Af Amer: 52 mL/min — ABNORMAL LOW (ref >60)
GFR calc non Af Amer: 45 mL/min — ABNORMAL LOW (ref >60)
GLUCOSE: 139 mg/dL — AB (ref 65–99)
POTASSIUM: 4.1 mmol/L (ref 3.5–5.1)
Sodium: 135 mmol/L (ref 135–145)

## 2017-04-03 LAB — C DIFFICILE QUICK SCREEN W PCR REFLEX
C DIFFICILE (CDIFF) INTERP: NOT DETECTED
C DIFFICILE (CDIFF) TOXIN: NEGATIVE
C Diff antigen: NEGATIVE

## 2017-04-03 LAB — PHOSPHORUS: Phosphorus: 2.7 mg/dL (ref 2.5–4.6)

## 2017-04-03 LAB — GLUCOSE, CAPILLARY
GLUCOSE-CAPILLARY: 127 mg/dL — AB (ref 65–99)
Glucose-Capillary: 130 mg/dL — ABNORMAL HIGH (ref 65–99)
Glucose-Capillary: 149 mg/dL — ABNORMAL HIGH (ref 65–99)

## 2017-04-03 LAB — MAGNESIUM: Magnesium: 1.5 mg/dL — ABNORMAL LOW (ref 1.7–2.4)

## 2017-04-03 MED ORDER — MAGNESIUM SULFATE 2 GM/50ML IV SOLN
2.0000 g | Freq: Once | INTRAVENOUS | Status: AC
  Administered 2017-04-03: 2 g via INTRAVENOUS
  Filled 2017-04-03: qty 50

## 2017-04-03 MED ORDER — LOPERAMIDE HCL 2 MG PO CAPS
2.0000 mg | ORAL_CAPSULE | Freq: Four times a day (QID) | ORAL | Status: DC | PRN
  Administered 2017-04-03: 2 mg via ORAL
  Filled 2017-04-03: qty 1

## 2017-04-03 MED ORDER — RISAQUAD PO CAPS
2.0000 | ORAL_CAPSULE | Freq: Three times a day (TID) | ORAL | Status: DC
  Administered 2017-04-03 – 2017-04-05 (×6): 2 via ORAL
  Filled 2017-04-03 (×11): qty 2

## 2017-04-03 MED ORDER — MORPHINE SULFATE (PF) 2 MG/ML IV SOLN
2.0000 mg | INTRAVENOUS | Status: DC | PRN
  Administered 2017-04-03 (×2): 2 mg via INTRAVENOUS
  Filled 2017-04-03 (×2): qty 1

## 2017-04-03 NOTE — Plan of Care (Signed)
VSS, free of falls during shift.  Reported abdominal pain 8/10, refused PRN PO Tylenol 650mg .  Dr. Marcille Blanco paged, pain improved to 4/10 w/ ordered PRN IV Morphine 2mg .  No other needs overnight.  Bed in low position, bed alarm on.  Call bell within reach, [place].

## 2017-04-03 NOTE — Progress Notes (Signed)
[place] at [facility]                                                                                                                                                                                  Patient Demographics   Brenda Beasley, is a 66 y.o. female, DOB - Aug 19, 1950, OXB:[id number]  Admit date - 04/02/2017   Admitting Physician Fritzi Mandes, MD  Outpatient Primary MD for the patient is Tama High III, MD   LOS - 1  Subjective Continues to have diarrhea, with lower abdominal cramping No further bleeding noted   Review of Systems:   CONSTITUTIONAL: No documented fever. No fatigue, weakness. No weight gain, no weight loss.  EYES: No blurry or double vision.  ENT: No tinnitus. No postnasal drip. No redness of the oropharynx.  RESPIRATORY: No cough, no wheeze, no hemoptysis. No dyspnea.  CARDIOVASCULAR: No chest pain. No orthopnea. No palpitations. No syncope.  GASTROINTESTINAL: No nausea, no vomiting, positive diarrhea. No abdominal pain. No melena or hematochezia.  GENITOURINARY: No dysuria or hematuria.  ENDOCRINE: No polyuria or nocturia. No heat or cold intolerance.  HEMATOLOGY: No anemia. No bruising. No bleeding.  INTEGUMENTARY: No rashes. No lesions.  MUSCULOSKELETAL: No arthritis. No swelling. No gout.  NEUROLOGIC: No numbness, tingling, or ataxia. No seizure-type activity.  PSYCHIATRIC: No anxiety. No insomnia. No ADD.    Vitals:   Vitals:   04/02/17 1916 04/02/17 1949 04/03/17 0440 04/03/17 1204  BP: 107/62 (!) 104/46 (!) 103/44 (!) 121/55  Pulse: 72 68 68 72  Resp: 16 20 20 20   Temp:  (!) 97.5 F (36.4 C) 98 F (36.7 C) 97.6 F (36.4 C)  TempSrc:  Oral  Oral  SpO2:  96% 95% 96%  Weight: 148 lb 9.6 oz (67.4 kg)     Height: 4\' 11"  (1.499 m)       Wt Readings from Last 3 Encounters:  04/02/17 148 lb 9.6 oz (67.4 kg)  02/11/17 156 lb (70.8 kg)  10/06/16 157 lb (71.2 kg)     Intake/Output Summary (Last 24 hours) at  04/03/2017 1241 Last data filed at 04/03/2017 0516 Gross per 24 hour  Intake 1462 ml  Output -  Net 1462 ml    Physical Exam:   GENERAL: Pleasant-appearing in no apparent distress.  HEAD, EYES, EARS, NOSE AND THROAT: Atraumatic, normocephalic. Extraocular muscles are intact. Pupils equal and reactive to light. Sclerae anicteric. No conjunctival injection. No oro-pharyngeal erythema.  NECK: Supple. There is no jugular venous distention. No bruits, no lymphadenopathy, no thyromegaly.  HEART: Regular rate and rhythm,. No murmurs, no rubs, no clicks.  LUNGS: Clear to auscultation bilaterally. No  rales or rhonchi. No wheezes.  ABDOMEN: Soft, flat, nontender, nondistended. Has good bowel sounds. No hepatosplenomegaly appreciated.  EXTREMITIES: No evidence of any cyanosis, clubbing, or peripheral edema.  +2 pedal and radial pulses bilaterally.  NEUROLOGIC: The patient is alert, awake, and oriented x3 with no focal motor or sensory deficits appreciated bilaterally.  SKIN: Moist and warm with no rashes appreciated.  Psych: Not anxious, depressed LN: No inguinal LN enlargement    Antibiotics   Anti-infectives (From admission, onward)   Start     Dose/Rate Route Frequency Ordered Stop   04/03/17 2000  metroNIDAZOLE (FLAGYL) IVPB 500 mg     500 mg 100 mL/hr over 60 Minutes Intravenous Every 8 hours 04/02/17 1830     04/03/17 1815  ciprofloxacin (CIPRO) IVPB 400 mg     400 mg 200 mL/hr over 60 Minutes Intravenous Every 12 hours 04/02/17 1848     04/03/17 0000  ciprofloxacin (CIPRO) IVPB 400 mg  Status:  Discontinued     400 mg 200 mL/hr over 60 Minutes Intravenous Every 12 hours 04/02/17 1830 04/02/17 1848   04/02/17 1815  levofloxacin (LEVAQUIN) IVPB 750 mg     750 mg 100 mL/hr over 90 Minutes Intravenous  Once 04/02/17 1810 04/02/17 2100   04/02/17 1815  metroNIDAZOLE (FLAGYL) IVPB 500 mg  Status:  Discontinued     500 mg 100 mL/hr over 60 Minutes Intravenous  Once 04/02/17 1810  04/02/17 1940      Medications   Scheduled Meds: . Influenza vac split quadrivalent PF  0.5 mL Intramuscular Tomorrow-1000  . insulin aspart  0-9 Units Subcutaneous TID WC   Continuous Infusions: . 0.9 % NaCl with KCl 40 mEq / L 125 mL/hr (04/03/17 1012)  . ciprofloxacin    . magnesium sulfate 1 - 4 g bolus IVPB    . metronidazole     PRN Meds:.acetaminophen **OR** acetaminophen, loperamide, morphine injection, ondansetron **OR** ondansetron (ZOFRAN) IV   Data Review:   Micro Results Recent Results (from the past 240 hour(s))  Gastrointestinal Panel by PCR , Stool     Status: Abnormal   Collection Time: 04/02/17  6:35 PM  Result Value Ref Range Status   Campylobacter species NOT DETECTED NOT DETECTED Final   Plesimonas shigelloides NOT DETECTED NOT DETECTED Final   Salmonella species DETECTED (A) NOT DETECTED Final    Comment: RESULT CALLED TO, READ BACK BY AND VERIFIED WITH: DOROTHY RIGUERA AT 2035 ON 04/02/17 RWW    Yersinia enterocolitica NOT DETECTED NOT DETECTED Final   Vibrio species NOT DETECTED NOT DETECTED Final   Vibrio cholerae NOT DETECTED NOT DETECTED Final   Enteroaggregative E coli (EAEC) NOT DETECTED NOT DETECTED Final   Enteropathogenic E coli (EPEC) NOT DETECTED NOT DETECTED Final   Enterotoxigenic E coli (ETEC) NOT DETECTED NOT DETECTED Final   Shiga like toxin producing E coli (STEC) NOT DETECTED NOT DETECTED Final   Shigella/Enteroinvasive E coli (EIEC) NOT DETECTED NOT DETECTED Final   Cryptosporidium NOT DETECTED NOT DETECTED Final   Cyclospora cayetanensis NOT DETECTED NOT DETECTED Final   Entamoeba histolytica NOT DETECTED NOT DETECTED Final   Giardia lamblia NOT DETECTED NOT DETECTED Final   Adenovirus F40/41 NOT DETECTED NOT DETECTED Final   Astrovirus NOT DETECTED NOT DETECTED Final   Norovirus GI/GII NOT DETECTED NOT DETECTED Final   Rotavirus A NOT DETECTED NOT DETECTED Final   Sapovirus (I, II, IV, and V) NOT DETECTED NOT DETECTED Final   Blood Culture (routine x 2)  Status: None (Preliminary result)   Collection Time: 04/02/17  6:35 PM  Result Value Ref Range Status   Specimen Description BLOOD BLOOD RIGHT HAND  Final   Special Requests   Final    BOTTLES DRAWN AEROBIC AND ANAEROBIC Blood Culture results may not be optimal due to an inadequate volume of blood received in culture bottles   Culture NO GROWTH < 12 HOURS  Final   Report Status PENDING  Incomplete  Blood Culture (routine x 2)     Status: None (Preliminary result)   Collection Time: 04/02/17  6:35 PM  Result Value Ref Range Status   Specimen Description BLOOD RIGHT ANTECUBITAL  Final   Special Requests   Final    BOTTLES DRAWN AEROBIC AND ANAEROBIC Blood Culture results may not be optimal due to an excessive volume of blood received in culture bottles   Culture NO GROWTH < 12 HOURS  Final   Report Status PENDING  Incomplete  C difficile quick scan w PCR reflex     Status: None   Collection Time: 04/02/17  6:35 PM  Result Value Ref Range Status   C Diff antigen NEGATIVE NEGATIVE Final   C Diff toxin NEGATIVE NEGATIVE Final   C Diff interpretation No C. difficile detected.  Final    Radiology Reports Ct Abdomen Pelvis W Contrast  Result Date: 04/02/2017 CLINICAL DATA:  Diarrhea and rectal bleeding. EXAM: CT ABDOMEN AND PELVIS WITH CONTRAST TECHNIQUE: Multidetector CT imaging of the abdomen and pelvis was performed using the standard protocol following bolus administration of intravenous contrast. CONTRAST:  69mL ISOVUE-300 IOPAMIDOL (ISOVUE-300) INJECTION 61% COMPARISON:  07/16/2011. FINDINGS: Lower chest: Tiny left lower lobe nodule on image number 4 series 4, without significant change. Hepatobiliary: Minimal diffuse low density of the liver relative to the spleen. The liver is mildly enlarged without significant change. Normal appearing gallbladder. Pancreas: Unremarkable. No pancreatic ductal dilatation or surrounding inflammatory changes. Spleen:  Borderline enlarged without focal abnormality. Adrenals/Urinary Tract: Upper pole right renal cysts. Normal appearing left kidney, ureters, urinary bladder and adrenal glands. Stomach/Bowel: Multiple colonic diverticula. Surgically absent right colon with a small bowel to colon anastomosis at the hepatic flexure. Interval mild diffuse colonic wall thickening. Unremarkable stomach and small bowel. Vascular/Lymphatic: Atheromatous arterial calcifications without aneurysm. Mildly prominent retroperitoneal and upper abdominal lymph nodes with no abnormally enlarged nodes. Reproductive: 1.3 cm rounded enhancing mass in the uterus on the left and 1.4 cm rounded enhancing mass in the uterus on the right. No adnexal masses. Other: 4.4 x 2.8 cm area curvilinear presacral soft tissue density with interspersed fat on image number 65 series 2, previously 3.8 x 1.8 cm. This measures 3.9 cm in length on sagittal image number 78. This appears to have a thin surrounding membrane with underlying fat on the sagittal images. Musculoskeletal: Lower thoracic spine degenerative changes and minimal lumbar spine degenerative changes. IMPRESSION: 1. Interval mild diffuse colonic wall thickening, possibly due to mild colitis. 2. Colonic diverticulosis. 3. Small uterine fibroids. 4. Mild increase in presacral curvilinear soft tissue density and fat density. This most likely represents postsurgical scarring/fat necrosis. This does not have the typical appearance of malignancy. 5. Minimal diffuse hepatic steatosis with improvement. 6. Stable mild hepatomegaly and borderline splenomegaly. Electronically Signed   By: Claudie Revering M.D.   On: 04/02/2017 17:50     CBC Recent Labs  Lab 04/02/17 1245  WBC 10.2  HGB 12.2  HCT 37.2  PLT 257  MCV 79.4*  MCH 26.1  MCHC  32.9  RDW 17.0*    Chemistries  Recent Labs  Lab 04/02/17 1245 04/03/17 0821  NA 131* 135  K 2.8* 4.1  CL 94* 105  CO2 21* 20*  GLUCOSE 174* 139*  BUN 33* 24*   CREATININE 1.72* 1.22*  CALCIUM 8.7* 8.2*  MG  --  1.5*  AST 22  --   ALT 11*  --   ALKPHOS 73  --   BILITOT 0.6  --    ------------------------------------------------------------------------------------------------------------------ estimated creatinine clearance is 37.4 mL/min (A) (by C-G formula based on SCr of 1.22 mg/dL (H)). ------------------------------------------------------------------------------------------------------------------ No results for input(s): HGBA1C in the last 72 hours. ------------------------------------------------------------------------------------------------------------------ No results for input(s): CHOL, HDL, LDLCALC, TRIG, CHOLHDL, LDLDIRECT in the last 72 hours. ------------------------------------------------------------------------------------------------------------------ No results for input(s): TSH, T4TOTAL, T3FREE, THYROIDAB in the last 72 hours.  Invalid input(s): FREET3 ------------------------------------------------------------------------------------------------------------------ No results for input(s): VITAMINB12, FOLATE, FERRITIN, TIBC, IRON, RETICCTPCT in the last 72 hours.  Coagulation profile No results for input(s): INR, PROTIME in the last 168 hours.  No results for input(s): DDIMER in the last 72 hours.  Cardiac Enzymes No results for input(s): CKMB, TROPONINI, MYOGLOBIN in the last 168 hours.  Invalid input(s): CK ------------------------------------------------------------------------------------------------------------------ Invalid input(s): [place]  is a 66 y.o. female with a known history of COPD, diabetes, GERD, history of irritable bowel syndrome comes to the emergency room for with diarrhea on and off for last 12 days.  Patient said she is up to 12-14 runny watery stools per day.  She has not been able to eat much since she is worried she is going to have diarrhea.     1. acute colitis Continue IV Cipro and Flagyl -Advance diet -Stool positive for Salmonella -Use Imodium as needed  2.  Acute renal failure secondary to GI losses -Improved with IV fluid -Avoid nephrotoxins Follow renal function   3.  Hypokalemia Due to GI losses from diarrhea Now replaced  4.  Lower GI bleed related to colitis and diarrhea Hemoglobin stable Follow hemoglobin in the a.m.  4. Dm-2  SSI -hold metformin due to CT contrast and elevated creatinine  5. DVT prophylaxis Lovenox        Code Status Orders  (From admission, onward)        Start     Ordered   04/02/17 2323  Full code  Continuous     04/02/17 2322    Code Status History    Date Active Date Inactive Code Status Order ID Comments User Context   This patient has a current code status but no historical code status.           Consults gastroenterology  DVT Prophylaxis SCDs Lab Results  Component Value Date   PLT 257 04/02/2017     Time Spent in minutes   35 minutes  Greater than 50% of time spent in care coordination and counseling patient regarding the condition and plan of care.   Dustin Flock M.D on 04/03/2017 at 12:41 PM  Between 7am to 6pm - Pager - [phone]  After 6pm go to [email or web address] - password [email or web address] [facility]   Office  [phone]

## 2017-04-03 NOTE — Consult Note (Signed)
Brenda Antigua, MD [address], [place], [place], [place], [address] [address], [place], [place], [place], [address] Phone: [phone]  Fax: [phone]  Consultation  Referring Provider: Dr. Posey Pronto     No ref. provider found Primary Care Physician:  Adin Hector, MD Primary Gastroenterologist:  Virgel Manifold, MD        Reason for Consultation:    Diarrhea,  blood per rectum  Date of Admission:  04/02/2017 Date of Consultation:  04/03/2017         HPI:   Brenda Beasley is a 66 y.o. female was a few weeks history of watery diarrhea consisting of 14-15 bowel movements a day, dehydration, GI consulted for blood per rectum.  Patient visited her primary care provider yesterday and complained of the above symptoms and was sent to the ER for stat labs and imaging due to hypotension noted during the office visit.  Stool cultures were positive for Salmonella.  Lactic acid is mildly elevated to 2.3 and normalized with IV fluid hydration.  [name] previous documentation reports a colonoscopy 2008 and a partial colectomy in 2008.  I do not have the endoscopy operative report from these procedures but pt states it was done due to a large polyp that could not be remove endoscopically. She reports having a colonoscopy most recently about 4-5 yrs ago.  CT abdomen this admission shows an absent right colon and a small bowel anastomosis to the hepatic flexure.  Reports intraluminal diffuse colonic wall thickening, mild colitis.  Colonic diverticulosis is also reported.    Past Medical History:  Diagnosis Date  . COPD (chronic obstructive pulmonary disease) ([place])   . Diabetes mellitus without complication ([place])   . GERD (gastroesophageal reflux disease)   . Hyperlipidemia   . Hypertension   . IBS (irritable bowel syndrome)   . Personal history of tobacco use, presenting hazards to health 09/20/2015  . Stroke ([place])   . Tobacco abuse 10/04/2014  . Vitamin B 12 deficiency      Past Surgical History:  Procedure Laterality Date  . PARTIAL COLECTOMY    . TONSILLECTOMY    . VENTRAL HERNIA REPAIR      Prior to Admission medications   Medication Sig Start Date End Date Taking? Authorizing Provider  albuterol (PROAIR HFA) 108 (90 Base) MCG/ACT inhaler Inhale 2 puffs into the lungs every 6 (six) hours as needed. 03/05/15  Yes [provider]  alendronate (FOSAMAX) 70 MG tablet Take 70 mg once a week by mouth. Take with a full glass of water on an empty stomach.   Yes [provider]  amLODipine (NORVASC) 5 MG tablet Take 5 mg by mouth daily. 03/05/15  Yes [provider]  aspirin EC 81 MG tablet Take 81 mg by mouth daily.   Yes [provider]  azelastine (ASTELIN) 0.1 % nasal spray Place 1 spray into the nose 2 (two) times daily. 09/03/15  Yes [provider]  Cholecalciferol (VITAMIN D3) 2000 units capsule Take 2 capsules by mouth daily.   Yes [provider]  cyanocobalamin (,VITAMIN B-12,) 1000 MCG/ML injection Inject 1,000 mcg into the muscle every 30 (thirty) days. 10/23/14  Yes [provider]  ferrous sulfate 325 (65 FE) MG tablet Take 325 mg by mouth daily. 03/15/15  Yes [provider]  fluticasone (FLONASE) 50 MCG/ACT nasal spray Place 2 sprays into the nose daily. 03/05/15  Yes [provider]  gabapentin (NEURONTIN) 100 MG capsule Take 200  mg at bedtime by mouth.   Yes [provider]  glipiZIDE (GLUCOTROL) 10 MG tablet Take 20 mg 2 (two) times daily by mouth.  03/15/15  Yes [provider]  hydrochlorothiazide (HYDRODIURIL) 25 MG tablet Take 25 mg by mouth daily. 03/05/15  Yes [provider]  ipratropium-albuterol (DUONEB) 0.5-2.5 (3) MG/3ML SOLN Take 3 mLs 4 (four) times daily as needed by nebulization.   Yes [provider]  lisinopril (PRINIVIL,ZESTRIL) 40 MG tablet Take 40 mg by mouth daily. 03/05/15  Yes [provider]   lovastatin (MEVACOR) 40 MG tablet Take 2 tablets by mouth daily. 03/05/15  Yes [provider]  MAGNESIUM PO Take daily by mouth.   Yes [provider]  omeprazole (PRILOSEC) 20 MG capsule Take 20 mg by mouth 2 (two) times daily. 03/05/15  Yes [provider]  sitaGLIPtin (JANUVIA) 50 MG tablet Take 50 mg daily by mouth.   Yes [provider]  TRADJENTA 5 MG TABS tablet Take 1 tablet by mouth daily. 02/05/17  Yes [provider]  benzonatate (TESSALON) 200 MG capsule Take 1 capsule by mouth 3 (three) times daily. 02/03/17   [provider]  metFORMIN (GLUCOPHAGE) 1000 MG tablet Take 1,000 mg by mouth 2 (two) times daily. 03/05/15   [provider]  predniSONE (DELTASONE) 10 MG tablet Take 1 tablet by mouth taper from 4 doses each day to 1 dose and stop. 02/03/17   [provider]    Family History  Problem Relation Age of Onset  . Diabetes Mother   . Diabetes Father   . Diabetes Sister   . Diabetes Maternal Grandmother   . Diabetes Paternal Grandmother      Social History   Tobacco Use  . Smoking status: Current Every Day Smoker    Packs/day: 0.50    Years: 52.00    Pack years: 26.00    Types: Cigarettes  . Smokeless tobacco: Never Used  Substance Use Topics  . Alcohol use: No    Alcohol/week: 0.0 oz  . Drug use: Not on file    Allergies as of 04/02/2017 - Review Complete 04/02/2017  Allergen Reaction Noted  . Other Shortness Of Breath and Hives 11/04/2015  . Penicillins Hives and Swelling 11/04/2015  . Pioglitazone Other (See Comments) and Palpitations 12/05/2015  . Byetta 10 mcg pen [exenatide] Other (See Comments) 11/04/2015  . Victoza [liraglutide] Other (See Comments) 11/04/2015  . Sulfa antibiotics Rash 11/04/2015    Review of Systems:    All systems reviewed and negative except where noted in HPI.   Physical Exam:  Vital signs in last 24 hours: Temp:  [97.5 F (36.4 C)-97.6 F (36.4 C)]  97.5 F (36.4 C) (11/16 1949) Pulse Rate:  [68-72] 68 (11/16 1949) Resp:  [16-20] 20 (11/16 1949) BP: (101-107)/(46-62) 104/46 (11/16 1949) SpO2:  [96 %] 96 % (11/16 1949) Weight:  [67.1 kg (148 lb)-67.4 kg (148 lb 9.6 oz)] 67.4 kg (148 lb 9.6 oz) (11/16 1916) Last BM Date: 04/02/17 General:   Pleasant, cooperative in NAD Head:  Normocephalic and atraumatic. Eyes:   No icterus.   Conjunctiva pink. PERRLA. Ears:  Normal auditory acuity. Neck:  Supple; no masses or thyroidomegaly Lungs: Respirations even and unlabored. Lungs clear to auscultation bilaterally.   No wheezes, crackles, or rhonchi.  Heart:  Regular rate and rhythm;  Without murmur, clicks, rubs or gallops Abdomen:  Soft, nondistended, nontender. Normal bowel sounds. No appreciable masses or hepatomegaly.  No rebound or guarding.  Neurologic:  Alert and oriented x3;  grossly normal neurologically. Skin:  Intact without significant lesions or rashes. Cervical Nodes:  No significant cervical adenopathy. Psych:  Alert and cooperative. Normal affect.  LAB RESULTS: Recent Labs    04/02/17 1245  WBC 10.2  HGB 12.2  HCT 37.2  PLT 257   BMET Recent Labs    04/02/17 1245  NA 131*  K 2.8*  CL 94*  CO2 21*  GLUCOSE 174*  BUN 33*  CREATININE 1.72*  CALCIUM 8.7*   LFT Recent Labs    04/02/17 1245  PROT 7.1  ALBUMIN 3.3*  AST 22  ALT 11*  ALKPHOS 73  BILITOT 0.6   PT/INR No results for input(s): LABPROT, INR in the last 72 hours.  STUDIES: Ct Abdomen Pelvis W Contrast  Result Date: 04/02/2017 CLINICAL DATA:  Diarrhea and rectal bleeding. EXAM: CT ABDOMEN AND PELVIS WITH CONTRAST TECHNIQUE: Multidetector CT imaging of the abdomen and pelvis was performed using the standard protocol following bolus administration of intravenous contrast. CONTRAST:  63mL ISOVUE-300 IOPAMIDOL (ISOVUE-300) INJECTION 61% COMPARISON:  07/16/2011. FINDINGS: Lower chest: Tiny left lower lobe nodule on image number 4 series 4, without  significant change. Hepatobiliary: Minimal diffuse low density of the liver relative to the spleen. The liver is mildly enlarged without significant change. Normal appearing gallbladder. Pancreas: Unremarkable. No pancreatic ductal dilatation or surrounding inflammatory changes. Spleen: Borderline enlarged without focal abnormality. Adrenals/Urinary Tract: Upper pole right renal cysts. Normal appearing left kidney, ureters, urinary bladder and adrenal glands. Stomach/Bowel: Multiple colonic diverticula. Surgically absent right colon with a small bowel to colon anastomosis at the hepatic flexure. Interval mild diffuse colonic wall thickening. Unremarkable stomach and small bowel. Vascular/Lymphatic: Atheromatous arterial calcifications without aneurysm. Mildly prominent retroperitoneal and upper abdominal lymph nodes with no abnormally enlarged nodes. Reproductive: 1.3 cm rounded enhancing mass in the uterus on the left and 1.4 cm rounded enhancing mass in the uterus on the right. No adnexal masses. Other: 4.4 x 2.8 cm area curvilinear presacral soft tissue density with interspersed fat on image number 65 series 2, previously 3.8 x 1.8 cm. This measures 3.9 cm in length on sagittal image number 78. This appears to have a thin surrounding membrane with underlying fat on the sagittal images. Musculoskeletal: Lower thoracic spine degenerative changes and minimal lumbar spine degenerative changes. IMPRESSION: 1. Interval mild diffuse colonic wall thickening, possibly due to mild colitis. 2. Colonic diverticulosis. 3. Small uterine fibroids. 4. Mild increase in presacral curvilinear soft tissue density and fat density. This most likely represents postsurgical scarring/fat necrosis. This does not have the typical appearance of malignancy. 5. Minimal diffuse hepatic steatosis with improvement. 6. Stable mild hepatomegaly and borderline splenomegaly. Electronically Signed   By: Claudie Revering M.D.   On: 04/02/2017 17:50       Impression / Plan:   Brenda Beasley is a 66 y.o. y/o female with several days of watery diarrhea with positive Salmonella stool studies on admission were GI consulted for bright red blood per rectum with no drop in hemoglobin  Bright red blood per rectum most likely due to multiple episodes of loose stools, diarrhea and acute Salmonella infection No drop in hemoglobin at this time No indication for colonoscopy Given the severity of her diarrhea and age above 65 okay to continue antibiotics.  Ciprofloxacin for 3-7 days as appropriate.  Flagyl can be discontinued. Continue hydration and good p.o. nutrition Symptoms are improving at this time  Thank you for involving me in the  care of this patient.      LOS: 1 day   Virgel Manifold, MD  04/03/2017, 12:38 AM

## 2017-04-03 NOTE — Progress Notes (Signed)
ELECTROLYTE CONSULT NOTE -follow up  Pharmacy Consult for electrolyte monitoring   Allergies  Allergen Reactions  . Other Shortness Of Breath and Hives    Cats hives Dust causes sneezing Affects breathing Tape-unknown  . Penicillins Hives and Swelling    Throat swell  Has patient had a PCN reaction causing immediate rash, facial/tongue/throat swelling, SOB or lightheadedness with hypotension: Yes Has patient had a PCN reaction causing severe rash involving mucus membranes or skin necrosis: Unknown Has patient had a PCN reaction that required hospitalization: Unknown Has patient had a PCN reaction occurring within the last 10 years: Unknown If all of the above answers are "NO", then may proceed with Cephalosporin use.   . Pioglitazone Other (See Comments) and Palpitations    Chest pain, numbness in fingers,   . Byetta 10 Mcg Pen [Exenatide] Other (See Comments)    Severe stomach pains  . Victoza [Liraglutide] Other (See Comments)    Severe stomach pains  . Sulfa Antibiotics Rash    Patient Measurements: Height: 4\' 11"  (149.9 cm) Weight: 148 lb 9.6 oz (67.4 kg) IBW/kg (Calculated) : 43.2  Vital Signs: Temp: 98 F (36.7 C) (11/17 0440) BP: 103/44 (11/17 0440) Pulse Rate: 68 (11/17 0440) Intake/Output from previous day: 11/16 0701 - 11/17 0700 In: 1462 [I.V.:389; IV Piggyback:1073] Out: -  Intake/Output from this shift: No intake/output data recorded.  Labs: Recent Labs    04/02/17 1245 04/03/17 0821  WBC 10.2  --   HGB 12.2  --   HCT 37.2  --   PLT 257  --   CREATININE 1.72* 1.22*  MG  --  1.5*  PHOS  --  2.7  ALBUMIN 3.3*  --   PROT 7.1  --   AST 22  --   ALT 11*  --   ALKPHOS 73  --   BILITOT 0.6  --    Estimated Creatinine Clearance: 37.4 mL/min (A) (by C-G formula based on SCr of 1.22 mg/dL (H)).   Microbiology: Recent Results (from the past 720 hour(s))  Gastrointestinal Panel by PCR , Stool     Status: Abnormal   Collection Time: 04/02/17   6:35 PM  Result Value Ref Range Status   Campylobacter species NOT DETECTED NOT DETECTED Final   Plesimonas shigelloides NOT DETECTED NOT DETECTED Final   Salmonella species DETECTED (A) NOT DETECTED Final    Comment: RESULT CALLED TO, READ BACK BY AND VERIFIED WITH: DOROTHY RIGUERA AT 2035 ON 04/02/17 RWW    Yersinia enterocolitica NOT DETECTED NOT DETECTED Final   Vibrio species NOT DETECTED NOT DETECTED Final   Vibrio cholerae NOT DETECTED NOT DETECTED Final   Enteroaggregative E coli (EAEC) NOT DETECTED NOT DETECTED Final   Enteropathogenic E coli (EPEC) NOT DETECTED NOT DETECTED Final   Enterotoxigenic E coli (ETEC) NOT DETECTED NOT DETECTED Final   Shiga like toxin producing E coli (STEC) NOT DETECTED NOT DETECTED Final   Shigella/Enteroinvasive E coli (EIEC) NOT DETECTED NOT DETECTED Final   Cryptosporidium NOT DETECTED NOT DETECTED Final   Cyclospora cayetanensis NOT DETECTED NOT DETECTED Final   Entamoeba histolytica NOT DETECTED NOT DETECTED Final   Giardia lamblia NOT DETECTED NOT DETECTED Final   Adenovirus F40/41 NOT DETECTED NOT DETECTED Final   Astrovirus NOT DETECTED NOT DETECTED Final   Norovirus GI/GII NOT DETECTED NOT DETECTED Final   Rotavirus A NOT DETECTED NOT DETECTED Final   Sapovirus (I, II, IV, and V) NOT DETECTED NOT DETECTED Final  Blood Culture (routine x 2)  Status: None (Preliminary result)   Collection Time: 04/02/17  6:35 PM  Result Value Ref Range Status   Specimen Description BLOOD BLOOD RIGHT HAND  Final   Special Requests   Final    BOTTLES DRAWN AEROBIC AND ANAEROBIC Blood Culture results may not be optimal due to an inadequate volume of blood received in culture bottles   Culture NO GROWTH < 12 HOURS  Final   Report Status PENDING  Incomplete  Blood Culture (routine x 2)     Status: None (Preliminary result)   Collection Time: 04/02/17  6:35 PM  Result Value Ref Range Status   Specimen Description BLOOD RIGHT ANTECUBITAL  Final   Special  Requests   Final    BOTTLES DRAWN AEROBIC AND ANAEROBIC Blood Culture results may not be optimal due to an excessive volume of blood received in culture bottles   Culture NO GROWTH < 12 HOURS  Final   Report Status PENDING  Incomplete  C difficile quick scan w PCR reflex     Status: None   Collection Time: 04/02/17  6:35 PM  Result Value Ref Range Status   C Diff antigen NEGATIVE NEGATIVE Final   C Diff toxin NEGATIVE NEGATIVE Final   C Diff interpretation No C. difficile detected.  Final    Medical History: Past Medical History:  Diagnosis Date  . COPD (chronic obstructive pulmonary disease) ([place])   . Diabetes mellitus without complication ([place])   . GERD (gastroesophageal reflux disease)   . Hyperlipidemia   . Hypertension   . IBS (irritable bowel syndrome)   . Personal history of tobacco use, presenting hazards to health 09/20/2015  . Stroke ([place])   . Tobacco abuse 10/04/2014  . Vitamin B 12 deficiency    Assessment:  66 yo female presents with diarrhea on and off for last 12 days. Patient reports 12-14 runny watery stools per day.    Goal of Therapy:  Electrolytes wnl  Plan:  11/16 K 2.8: was replaced with KCl 80 mEq PO once.  Will check BMP in AM and continue to follow and monitor electrolytes.   11/7  K 4.1, Mag 1.5, Phos 2.7 Patient currently on IVF-  NS w/ 40 meq/L @ 125 ml/hr. Will order Magnesium sulfate 2 gram IV x 1.  F/u labs in am  Chinita Greenland PharmD Clinical Pharmacist 04/03/2017

## 2017-04-04 DIAGNOSIS — R197 Diarrhea, unspecified: Secondary | ICD-10-CM

## 2017-04-04 DIAGNOSIS — A029 Salmonella infection, unspecified: Secondary | ICD-10-CM

## 2017-04-04 LAB — BASIC METABOLIC PANEL
Anion gap: 6 (ref 5–15)
BUN: 15 mg/dL (ref 6–20)
CHLORIDE: 109 mmol/L (ref 101–111)
CO2: 21 mmol/L — ABNORMAL LOW (ref 22–32)
CREATININE: 1 mg/dL (ref 0.44–1.00)
Calcium: 8.1 mg/dL — ABNORMAL LOW (ref 8.9–10.3)
GFR, EST NON AFRICAN AMERICAN: 57 mL/min — AB (ref >60)
Glucose, Bld: 184 mg/dL — ABNORMAL HIGH (ref 65–99)
POTASSIUM: 4.4 mmol/L (ref 3.5–5.1)
SODIUM: 136 mmol/L (ref 135–145)

## 2017-04-04 LAB — GLUCOSE, CAPILLARY
GLUCOSE-CAPILLARY: 141 mg/dL — AB (ref 65–99)
GLUCOSE-CAPILLARY: 174 mg/dL — AB (ref 65–99)
Glucose-Capillary: 189 mg/dL — ABNORMAL HIGH (ref 65–99)
Glucose-Capillary: 202 mg/dL — ABNORMAL HIGH (ref 65–99)
Glucose-Capillary: 135 mg/dL — ABNORMAL HIGH (ref 65–99)

## 2017-04-04 LAB — MAGNESIUM: MAGNESIUM: 1.5 mg/dL — AB (ref 1.7–2.4)

## 2017-04-04 MED ORDER — IBUPROFEN 400 MG PO TABS
400.0000 mg | ORAL_TABLET | Freq: Once | ORAL | Status: AC
  Administered 2017-04-04: 21:00:00 400 mg via ORAL

## 2017-04-04 MED ORDER — MAGNESIUM SULFATE 4 GM/100ML IV SOLN
4.0000 g | Freq: Once | INTRAVENOUS | Status: AC
  Administered 2017-04-04: 10:00:00 4 g via INTRAVENOUS
  Filled 2017-04-04: qty 100

## 2017-04-04 NOTE — Plan of Care (Signed)
VSS, free of falls during shift.  Reported abdominal pain 8/10, improved to 2/10 w/ PRN IV Morphine 2mg .  Reported rectal/anal soreness d/t diarrhea, barrier cream at bedside.  No other complaints overnight.  Son at bedside, call bell within reach.  Bed in low position.  WCTM.

## 2017-04-04 NOTE — Progress Notes (Signed)
[place] at [facility]                                                                                                                                                                                  Patient Demographics   Brenda Beasley, is a 66 y.o. female, DOB - [birth year], UTM:[id number]  Admit date - 04/02/2017   Admitting Physician Fritzi Mandes, MD  Outpatient Primary MD for the patient is Adin Hector, MD   LOS - 2  Subjective Patient reports the diarrhea is much improved only 3 episodes today no abdominal pain Tolerating low-fat diet   Review of Systems:   CONSTITUTIONAL: No documented fever. No fatigue, weakness. No weight gain, no weight loss.  EYES: No blurry or double vision.  ENT: No tinnitus. No postnasal drip. No redness of the oropharynx.  RESPIRATORY: No cough, no wheeze, no hemoptysis. No dyspnea.  CARDIOVASCULAR: No chest pain. No orthopnea. No palpitations. No syncope.  GASTROINTESTINAL: No nausea, no vomiting, positive diarrhea. No abdominal pain. No melena or hematochezia.  GENITOURINARY: No dysuria or hematuria.  ENDOCRINE: No polyuria or nocturia. No heat or cold intolerance.  HEMATOLOGY: No anemia. No bruising. No bleeding.  INTEGUMENTARY: No rashes. No lesions.  MUSCULOSKELETAL: No arthritis. No swelling. No gout.  NEUROLOGIC: No numbness, tingling, or ataxia. No seizure-type activity.  PSYCHIATRIC: No anxiety. No insomnia. No ADD.    Vitals:   Vitals:   04/03/17 1204 04/03/17 2114 04/04/17 0526 04/04/17 1253  BP: (!) 121/55 (!) 126/47 (!) 114/49 (!) 119/44  Pulse: 72 77 72 72  Resp: 20 18 18 18   Temp: 97.6 F (36.4 C) 98.4 F (36.9 C) 98.3 F (36.8 C) 98.8 F (37.1 C)  TempSrc: Oral Oral Oral   SpO2: 96% 96% 96% 99%  Weight:      Height:        Wt Readings from Last 3 Encounters:  04/02/17 148 lb 9.6 oz (67.4 kg)  02/11/17 156 lb (70.8 kg)  10/06/16 157 lb (71.2 kg)     Intake/Output Summary (Last  24 hours) at 04/04/2017 1435 Last data filed at 04/04/2017 1031 Gross per 24 hour  Intake 3044 ml  Output -  Net 3044 ml    Physical Exam:   GENERAL: Pleasant-appearing in no apparent distress.  HEAD, EYES, EARS, NOSE AND THROAT: Atraumatic, normocephalic. Extraocular muscles are intact. Pupils equal and reactive to light. Sclerae anicteric. No conjunctival injection. No oro-pharyngeal erythema.  NECK: Supple. There is no jugular venous distention. No bruits, no lymphadenopathy, no thyromegaly.  HEART: Regular rate and rhythm,. No murmurs, no rubs, no clicks.  LUNGS: Clear to auscultation bilaterally. No  rales or rhonchi. No wheezes.  ABDOMEN: Soft, flat, nontender, nondistended. Has good bowel sounds. No hepatosplenomegaly appreciated.  EXTREMITIES: No evidence of any cyanosis, clubbing, or peripheral edema.  +2 pedal and radial pulses bilaterally.  NEUROLOGIC: The patient is alert, awake, and oriented x3 with no focal motor or sensory deficits appreciated bilaterally.  SKIN: Moist and warm with no rashes appreciated.  Psych: Not anxious, depressed LN: No inguinal LN enlargement    Antibiotics   Anti-infectives (From admission, onward)   Start     Dose/Rate Route Frequency Ordered Stop   04/03/17 2000  metroNIDAZOLE (FLAGYL) IVPB 500 mg  Status:  Discontinued     500 mg 100 mL/hr over 60 Minutes Intravenous Every 8 hours 04/02/17 1830 04/04/17 1153   04/03/17 1815  ciprofloxacin (CIPRO) IVPB 400 mg     400 mg 200 mL/hr over 60 Minutes Intravenous Every 12 hours 04/02/17 1848     04/03/17 0000  ciprofloxacin (CIPRO) IVPB 400 mg  Status:  Discontinued     400 mg 200 mL/hr over 60 Minutes Intravenous Every 12 hours 04/02/17 1830 04/02/17 1848   04/02/17 1815  levofloxacin (LEVAQUIN) IVPB 750 mg     750 mg 100 mL/hr over 90 Minutes Intravenous  Once 04/02/17 1810 04/02/17 2100   04/02/17 1815  metroNIDAZOLE (FLAGYL) IVPB 500 mg  Status:  Discontinued     500 mg 100 mL/hr over 60  Minutes Intravenous  Once 04/02/17 1810 04/02/17 1940      Medications   Scheduled Meds: . acidophilus  2 capsule Oral TID  . Influenza vac split quadrivalent PF  0.5 mL Intramuscular Tomorrow-1000  . insulin aspart  0-9 Units Subcutaneous TID WC   Continuous Infusions: . ciprofloxacin Stopped (04/04/17 0709)   PRN Meds:.acetaminophen **OR** acetaminophen, loperamide, morphine injection, ondansetron **OR** ondansetron (ZOFRAN) IV   Data Review:   Micro Results Recent Results (from the past 240 hour(s))  Gastrointestinal Panel by PCR , Stool     Status: Abnormal   Collection Time: 04/02/17  6:35 PM  Result Value Ref Range Status   Campylobacter species NOT DETECTED NOT DETECTED Final   Plesimonas shigelloides NOT DETECTED NOT DETECTED Final   Salmonella species DETECTED (A) NOT DETECTED Final    Comment: RESULT CALLED TO, READ BACK BY AND VERIFIED WITH: DOROTHY RIGUERA AT 2035 ON 04/02/17 RWW    Yersinia enterocolitica NOT DETECTED NOT DETECTED Final   Vibrio species NOT DETECTED NOT DETECTED Final   Vibrio cholerae NOT DETECTED NOT DETECTED Final   Enteroaggregative E coli (EAEC) NOT DETECTED NOT DETECTED Final   Enteropathogenic E coli (EPEC) NOT DETECTED NOT DETECTED Final   Enterotoxigenic E coli (ETEC) NOT DETECTED NOT DETECTED Final   Shiga like toxin producing E coli (STEC) NOT DETECTED NOT DETECTED Final   Shigella/Enteroinvasive E coli (EIEC) NOT DETECTED NOT DETECTED Final   Cryptosporidium NOT DETECTED NOT DETECTED Final   Cyclospora cayetanensis NOT DETECTED NOT DETECTED Final   Entamoeba histolytica NOT DETECTED NOT DETECTED Final   Giardia lamblia NOT DETECTED NOT DETECTED Final   Adenovirus F40/41 NOT DETECTED NOT DETECTED Final   Astrovirus NOT DETECTED NOT DETECTED Final   Norovirus GI/GII NOT DETECTED NOT DETECTED Final   Rotavirus A NOT DETECTED NOT DETECTED Final   Sapovirus (I, II, IV, and V) NOT DETECTED NOT DETECTED Final  Blood Culture (routine x  2)     Status: None (Preliminary result)   Collection Time: 04/02/17  6:35 PM  Result Value Ref Range  Status   Specimen Description BLOOD BLOOD RIGHT HAND  Final   Special Requests   Final    BOTTLES DRAWN AEROBIC AND ANAEROBIC Blood Culture results may not be optimal due to an inadequate volume of blood received in culture bottles   Culture NO GROWTH 2 DAYS  Final   Report Status PENDING  Incomplete  Blood Culture (routine x 2)     Status: None (Preliminary result)   Collection Time: 04/02/17  6:35 PM  Result Value Ref Range Status   Specimen Description BLOOD RIGHT ANTECUBITAL  Final   Special Requests   Final    BOTTLES DRAWN AEROBIC AND ANAEROBIC Blood Culture results may not be optimal due to an excessive volume of blood received in culture bottles   Culture NO GROWTH 2 DAYS  Final   Report Status PENDING  Incomplete  C difficile quick scan w PCR reflex     Status: None   Collection Time: 04/02/17  6:35 PM  Result Value Ref Range Status   C Diff antigen NEGATIVE NEGATIVE Final   C Diff toxin NEGATIVE NEGATIVE Final   C Diff interpretation No C. difficile detected.  Final    Radiology Reports Ct Abdomen Pelvis W Contrast  Result Date: 04/02/2017 CLINICAL DATA:  Diarrhea and rectal bleeding. EXAM: CT ABDOMEN AND PELVIS WITH CONTRAST TECHNIQUE: Multidetector CT imaging of the abdomen and pelvis was performed using the standard protocol following bolus administration of intravenous contrast. CONTRAST:  47mL ISOVUE-300 IOPAMIDOL (ISOVUE-300) INJECTION 61% COMPARISON:  07/16/2011. FINDINGS: Lower chest: Tiny left lower lobe nodule on image number 4 series 4, without significant change. Hepatobiliary: Minimal diffuse low density of the liver relative to the spleen. The liver is mildly enlarged without significant change. Normal appearing gallbladder. Pancreas: Unremarkable. No pancreatic ductal dilatation or surrounding inflammatory changes. Spleen: Borderline enlarged without focal  abnormality. Adrenals/Urinary Tract: Upper pole right renal cysts. Normal appearing left kidney, ureters, urinary bladder and adrenal glands. Stomach/Bowel: Multiple colonic diverticula. Surgically absent right colon with a small bowel to colon anastomosis at the hepatic flexure. Interval mild diffuse colonic wall thickening. Unremarkable stomach and small bowel. Vascular/Lymphatic: Atheromatous arterial calcifications without aneurysm. Mildly prominent retroperitoneal and upper abdominal lymph nodes with no abnormally enlarged nodes. Reproductive: 1.3 cm rounded enhancing mass in the uterus on the left and 1.4 cm rounded enhancing mass in the uterus on the right. No adnexal masses. Other: 4.4 x 2.8 cm area curvilinear presacral soft tissue density with interspersed fat on image number 65 series 2, previously 3.8 x 1.8 cm. This measures 3.9 cm in length on sagittal image number 78. This appears to have a thin surrounding membrane with underlying fat on the sagittal images. Musculoskeletal: Lower thoracic spine degenerative changes and minimal lumbar spine degenerative changes. IMPRESSION: 1. Interval mild diffuse colonic wall thickening, possibly due to mild colitis. 2. Colonic diverticulosis. 3. Small uterine fibroids. 4. Mild increase in presacral curvilinear soft tissue density and fat density. This most likely represents postsurgical scarring/fat necrosis. This does not have the typical appearance of malignancy. 5. Minimal diffuse hepatic steatosis with improvement. 6. Stable mild hepatomegaly and borderline splenomegaly. Electronically Signed   By: Claudie Revering M.D.   On: 04/02/2017 17:50     CBC Recent Labs  Lab 04/02/17 1245  WBC 10.2  HGB 12.2  HCT 37.2  PLT 257  MCV 79.4*  MCH 26.1  MCHC 32.9  RDW 17.0*    Chemistries  Recent Labs  Lab 04/02/17 1245 04/03/17 0821 04/04/17 0423  NA 131* 135 136  K 2.8* 4.1 4.4  CL 94* 105 109  CO2 21* 20* 21*  GLUCOSE 174* 139* 184*  BUN 33* 24*  15  CREATININE 1.72* 1.22* 1.00  CALCIUM 8.7* 8.2* 8.1*  MG  --  1.5* 1.5*  AST 22  --   --   ALT 11*  --   --   ALKPHOS 73  --   --   BILITOT 0.6  --   --    ------------------------------------------------------------------------------------------------------------------ estimated creatinine clearance is 45.6 mL/min (by C-G formula based on SCr of 1 mg/dL). ------------------------------------------------------------------------------------------------------------------ No results for input(s): HGBA1C in the last 72 hours. ------------------------------------------------------------------------------------------------------------------ No results for input(s): CHOL, HDL, LDLCALC, TRIG, CHOLHDL, LDLDIRECT in the last 72 hours. ------------------------------------------------------------------------------------------------------------------ No results for input(s): TSH, T4TOTAL, T3FREE, THYROIDAB in the last 72 hours.  Invalid input(s): FREET3 ------------------------------------------------------------------------------------------------------------------ No results for input(s): VITAMINB12, FOLATE, FERRITIN, TIBC, IRON, RETICCTPCT in the last 72 hours.  Coagulation profile No results for input(s): INR, PROTIME in the last 168 hours.  No results for input(s): DDIMER in the last 72 hours.  Cardiac Enzymes No results for input(s): CKMB, TROPONINI, MYOGLOBIN in the last 168 hours.  Invalid input(s): CK ------------------------------------------------------------------------------------------------------------------ Invalid input(s): [place]  is a 66 y.o. female with a known history of COPD, diabetes, GERD, history of irritable bowel syndrome comes to the emergency room for with diarrhea on and off for last 12 days.  Patient said she is up to 12-14 runny watery stools per day.  She has not been able to eat much since she is worried she is  going to have diarrhea.    1. acute colitis secondary to Salmonella Continue Cipro -Advance diet -Use Imodium as needed  2.  Acute renal failure secondary to GI losses -Resolved -Stop IV fluids -Avoid nephrotoxins Follow renal function   3.  Hypokalemia Due to GI losses from diarrhea Status post replacement  4.  Lower GI bleed related to colitis and diarrhea Hemoglobin stable Repeat hemoglobin in the morning  4. Dm-2  SSI -Patient can resume metformin on discharge  5. DVT prophylaxis Lovenox        Code Status Orders  (From admission, onward)        Start     Ordered   04/02/17 2323  Full code  Continuous     04/02/17 2322    Code Status History    Date Active Date Inactive Code Status Order ID Comments User Context   This patient has a current code status but no historical code status.           Consults gastroenterology  DVT Prophylaxis SCDs Lab Results  Component Value Date   PLT 257 04/02/2017     Time Spent in minutes   32 minutes  Greater than 50% of time spent in care coordination and counseling patient regarding the condition and plan of care.   Dustin Flock M.D on 04/04/2017 at 2:35 PM  Between 7am to 6pm - Pager - [phone]  After 6pm go to [email or web address] - password [email or web address] [facility]   Office  [phone]

## 2017-04-04 NOTE — Progress Notes (Signed)
ELECTROLYTE CONSULT NOTE -follow up  Pharmacy Consult for electrolyte monitoring   Allergies  Allergen Reactions  . Other Shortness Of Breath and Hives    Cats hives Dust causes sneezing Affects breathing Tape-unknown  . Penicillins Hives and Swelling    Throat swell  Has patient had a PCN reaction causing immediate rash, facial/tongue/throat swelling, SOB or lightheadedness with hypotension: Yes Has patient had a PCN reaction causing severe rash involving mucus membranes or skin necrosis: Unknown Has patient had a PCN reaction that required hospitalization: Unknown Has patient had a PCN reaction occurring within the last 10 years: Unknown If all of the above answers are "NO", then may proceed with Cephalosporin use.   . Pioglitazone Other (See Comments) and Palpitations    Chest pain, numbness in fingers,   . Byetta 10 Mcg Pen [Exenatide] Other (See Comments)    Severe stomach pains  . Victoza [Liraglutide] Other (See Comments)    Severe stomach pains  . Sulfa Antibiotics Rash    Patient Measurements: Height: 4\' 11"  (149.9 cm) Weight: 148 lb 9.6 oz (67.4 kg) IBW/kg (Calculated) : 43.2  Vital Signs: Temp: 98.8 F (37.1 C) (11/18 1253) Temp Source: Oral (11/18 0526) BP: 119/44 (11/18 1253) Pulse Rate: 72 (11/18 1253) Intake/Output from previous day: 11/17 0701 - 11/18 0700 In: 2444 [P.O.:480; I.V.:1704; IV Piggyback:260] Out: -  Intake/Output from this shift: Total I/O In: 600 [P.O.:600] Out: -   Labs: Recent Labs    04/02/17 1245 04/03/17 0821 04/04/17 0423  WBC 10.2  --   --   HGB 12.2  --   --   HCT 37.2  --   --   PLT 257  --   --   CREATININE 1.72* 1.22* 1.00  MG  --  1.5* 1.5*  PHOS  --  2.7  --   ALBUMIN 3.3*  --   --   PROT 7.1  --   --   AST 22  --   --   ALT 11*  --   --   ALKPHOS 73  --   --   BILITOT 0.6  --   --    Estimated Creatinine Clearance: 45.6 mL/min (by C-G formula based on SCr of 1 mg/dL).   Microbiology: Recent Results  (from the past 720 hour(s))  Gastrointestinal Panel by PCR , Stool     Status: Abnormal   Collection Time: 04/02/17  6:35 PM  Result Value Ref Range Status   Campylobacter species NOT DETECTED NOT DETECTED Final   Plesimonas shigelloides NOT DETECTED NOT DETECTED Final   Salmonella species DETECTED (A) NOT DETECTED Final    Comment: RESULT CALLED TO, READ BACK BY AND VERIFIED WITH: DOROTHY RIGUERA AT 2035 ON 04/02/17 RWW    Yersinia enterocolitica NOT DETECTED NOT DETECTED Final   Vibrio species NOT DETECTED NOT DETECTED Final   Vibrio cholerae NOT DETECTED NOT DETECTED Final   Enteroaggregative E coli (EAEC) NOT DETECTED NOT DETECTED Final   Enteropathogenic E coli (EPEC) NOT DETECTED NOT DETECTED Final   Enterotoxigenic E coli (ETEC) NOT DETECTED NOT DETECTED Final   Shiga like toxin producing E coli (STEC) NOT DETECTED NOT DETECTED Final   Shigella/Enteroinvasive E coli (EIEC) NOT DETECTED NOT DETECTED Final   Cryptosporidium NOT DETECTED NOT DETECTED Final   Cyclospora cayetanensis NOT DETECTED NOT DETECTED Final   Entamoeba histolytica NOT DETECTED NOT DETECTED Final   Giardia lamblia NOT DETECTED NOT DETECTED Final   Adenovirus F40/41 NOT DETECTED NOT DETECTED Final  Astrovirus NOT DETECTED NOT DETECTED Final   Norovirus GI/GII NOT DETECTED NOT DETECTED Final   Rotavirus A NOT DETECTED NOT DETECTED Final   Sapovirus (I, II, IV, and V) NOT DETECTED NOT DETECTED Final  Blood Culture (routine x 2)     Status: None (Preliminary result)   Collection Time: 04/02/17  6:35 PM  Result Value Ref Range Status   Specimen Description BLOOD BLOOD RIGHT HAND  Final   Special Requests   Final    BOTTLES DRAWN AEROBIC AND ANAEROBIC Blood Culture results may not be optimal due to an inadequate volume of blood received in culture bottles   Culture NO GROWTH 2 DAYS  Final   Report Status PENDING  Incomplete  Blood Culture (routine x 2)     Status: None (Preliminary result)   Collection Time:  04/02/17  6:35 PM  Result Value Ref Range Status   Specimen Description BLOOD RIGHT ANTECUBITAL  Final   Special Requests   Final    BOTTLES DRAWN AEROBIC AND ANAEROBIC Blood Culture results may not be optimal due to an excessive volume of blood received in culture bottles   Culture NO GROWTH 2 DAYS  Final   Report Status PENDING  Incomplete  C difficile quick scan w PCR reflex     Status: None   Collection Time: 04/02/17  6:35 PM  Result Value Ref Range Status   C Diff antigen NEGATIVE NEGATIVE Final   C Diff toxin NEGATIVE NEGATIVE Final   C Diff interpretation No C. difficile detected.  Final    Medical History: Past Medical History:  Diagnosis Date  . COPD (chronic obstructive pulmonary disease) ([place])   . Diabetes mellitus without complication ([place])   . GERD (gastroesophageal reflux disease)   . Hyperlipidemia   . Hypertension   . IBS (irritable bowel syndrome)   . Personal history of tobacco use, presenting hazards to health 09/20/2015  . Stroke ([place])   . Tobacco abuse 10/04/2014  . Vitamin B 12 deficiency    Assessment:  66 yo female presents with diarrhea on and off for last 12 days. +salmonella. Patient reports 12-14 runny watery stools per day.    Goal of Therapy:  Electrolytes wnl  Plan:  11/16 K 2.8: was replaced with KCl 80 mEq PO once.  Will check BMP in AM and continue to follow and monitor electrolytes.   11/7  K 4.1, Mag 1.5, Phos 2.7 Patient currently on IVF-  NS w/ 40 meq/L @ 125 ml/hr. Will order Magnesium sulfate 2 gram IV x 1.  F/u labs in am  11/18  K 4.4,  Mag 1.5.   Patient currently on IVF-  NS w/ 40 meq/L @ 125 ml/hr. Will order Magnesium sulfate 4 gram IV x 1.  F/u labs in am  Chinita Greenland PharmD Clinical Pharmacist 04/04/2017

## 2017-04-04 NOTE — Progress Notes (Signed)
Pt asking that her IV antibiotics be run slowly through her IV. She states that she has had multiple IVs infiltrate this admission and that she was told to have her medications administered slowly. Her 400mg  of ciprofloxacin is ordered to be administered now at 266ml/hr. Sheema, PharmD called to see if it can be run any slower. She stated that the medication may be administered over 2 hours. Rate slowed to 150ml/hr.

## 2017-04-04 NOTE — Progress Notes (Signed)
Initial Nutrition Assessment  DOCUMENTATION CODES:   Obesity unspecified  INTERVENTION:  Encouraged adequate intake of calories and protein at meals to prevent further weight loss. Discussed sources of protein patient enjoys. Patient does not want any further nutrition intervention.  NUTRITION DIAGNOSIS:   Inadequate oral intake related to decreased appetite, nausea, diarrhea as evidenced by per patient/family report.  GOAL:   Patient will meet greater than or equal to 90% of their needs  MONITOR:   PO intake, Supplement acceptance, Labs, Weight trends, I & O's  REASON FOR ASSESSMENT:   Malnutrition Screening Tool, Consult Assessment of nutrition requirement/status  ASSESSMENT:   66 year old female with PMHx of HLD, HTN, GERD, vitamin B12 deficiency, DM type 2, IBS, hx CVA, COPD, surgical hx of ventral hernia repair and partial colectomy who presented with 12 day history of diarrhea found to have acute colitis, stool positive for Salmonella, ARF secondary to GI losses.   Met with patient and her daughter at bedside. Patient reports that for approximately 15 days PTA she had not been eating well. She was having nausea, diarrhea, and everything she ate tasted salty. She was having up to 12-14 episodes of diarrhea daily and reports she was very dehydrated on admission. She reports she was still attempting to eat, but eating smaller amounts of food. Patient reports that since she has been here on fluids she feels much better. She has had 100% of dinner last night, breakfast, and lunch today. She reports she does not feel like she needs a nutrition intervention (does not want ONS or snacks).   UBW 157 lbs. Patient believes she lost approximately 9 lbs (5.7% body weight) over 2 weeks, which would be significant for time frame. However, would expect weight to go back up some after rehydration. Unable to check current weight as patient is sitting in chair and not in bed.  Medications  reviewed and include: acidophilus 2 capsules TID, Novolog 0-9 units TID, Cipro.  Labs reviewed: CBG 127-202, CO2 21, Magnesium 1.5. Pharmacy has been consulted for electrolyte monitoring.  Patient does not meet criteria for malnutrition.  NUTRITION - FOCUSED PHYSICAL EXAM:    Most Recent Value  Orbital Region  No depletion  Upper Arm Region  Mild depletion  Thoracic and Lumbar Region  No depletion  Buccal Region  No depletion  Temple Region  No depletion  Clavicle Bone Region  No depletion  Clavicle and Acromion Bone Region  No depletion  Scapular Bone Region  No depletion  Dorsal Hand  No depletion  Patellar Region  No depletion  Anterior Thigh Region  No depletion  Posterior Calf Region  No depletion  Edema (RD Assessment)  None  Hair  Reviewed  Eyes  Reviewed  Mouth  Reviewed  Skin  Reviewed  Nails  Reviewed     Diet Order:  Diet Heart Room service appropriate? Yes; Fluid consistency: Thin  EDUCATION NEEDS:   Education needs have been addressed  Skin:  Skin Assessment: Reviewed RN Assessment(no issues)  Last BM:  04/03/2017 - small type 7  Height:   Ht Readings from Last 1 Encounters:  04/02/17 '4\' 11"'  (1.499 m)    Weight:   Wt Readings from Last 1 Encounters:  04/02/17 148 lb 9.6 oz (67.4 kg)    Ideal Body Weight:  43.2 kg  BMI:  Body mass index is 30.01 kg/m.  Estimated Nutritional Needs:   Kcal:  1460-1680 (MSJ x 1.3-1.5)  Protein:  80-90 grams (1.2-1.3 grams/kg)  Fluid:  2 L/day (30 mL/kg)  Willey Blade, MS, RD, LDN Office: [phone] Pager: [phone] After Hours/Weekend Pager: [phone]

## 2017-04-04 NOTE — Care Management Note (Signed)
Case Management Note  Patient Details  Name: [name] MRN: [id number] Date of Birth: February 03, 1951  Subjective/Objective:       Ms [name] was admitted with acute colitis. She reports that her son resides with her but that "he is no help to me." She reports that she would welcome having some home health services even if just temporarily when she returns home. Ms Hendel was provided with a list of home health providers from which to choose if home health services are ordered at discharge. PCP=DR Ramonita Lab. Pharmacy= [facility] in [place]. She has no home equipment. No home oxygen. No current home health services. She drives herself to appointments. Case management will follow for discharge planning.               Action/Plan:   Expected Discharge Date:                  Expected Discharge Plan:     In-House Referral:     Discharge planning Services     Post Acute Care Choice:    Choice offered to:     DME Arranged:    DME Agency:     HH Arranged:    HH Agency:     Status of Service:     If discussed at [employer] of Stay Meetings, dates discussed:    Additional Comments:  Claude Waldman A, RN 04/04/2017, 4:07 PM

## 2017-04-05 LAB — BASIC METABOLIC PANEL
Anion gap: 5 (ref 5–15)
BUN: 12 mg/dL (ref 6–20)
CALCIUM: 8.5 mg/dL — AB (ref 8.9–10.3)
CO2: 23 mmol/L (ref 22–32)
CREATININE: 0.87 mg/dL (ref 0.44–1.00)
Chloride: 107 mmol/L (ref 101–111)
GFR calc Af Amer: >60 mL/min (ref >60)
GFR calc non Af Amer: >60 mL/min (ref >60)
GLUCOSE: 184 mg/dL — AB (ref 65–99)
Potassium: 3.8 mmol/L (ref 3.5–5.1)
Sodium: 135 mmol/L (ref 135–145)

## 2017-04-05 LAB — GLUCOSE, CAPILLARY
Glucose-Capillary: 174 mg/dL — ABNORMAL HIGH (ref 65–99)
Glucose-Capillary: 225 mg/dL — ABNORMAL HIGH (ref 65–99)
Glucose-Capillary: 159 mg/dL — ABNORMAL HIGH (ref 65–99)

## 2017-04-05 LAB — MAGNESIUM: Magnesium: 1.8 mg/dL (ref 1.7–2.4)

## 2017-04-05 LAB — HEMOGLOBIN: Hemoglobin: 10.2 g/dL — ABNORMAL LOW (ref 12.0–16.0)

## 2017-04-05 LAB — PHOSPHORUS: Phosphorus: 4.3 mg/dL (ref 2.5–4.6)

## 2017-04-05 MED ORDER — CIPROFLOXACIN HCL 500 MG PO TABS: 500.0000 mg | ORAL_TABLET | Freq: Two times a day (BID) | ORAL | Status: DC

## 2017-04-05 MED ORDER — CIPROFLOXACIN HCL 500 MG PO TABS: 500.0000 mg | ORAL_TABLET | Freq: Two times a day (BID) | ORAL | 0 refills | Status: AC

## 2017-04-05 MED ORDER — LOPERAMIDE HCL 2 MG PO CAPS: 2.0000 mg | ORAL_CAPSULE | Freq: Four times a day (QID) | ORAL | 0 refills | Status: DC | PRN

## 2017-04-05 NOTE — Discharge Summary (Addendum)
[facility] [place] [facility], 66 y.o., DOB 11/07/1950, MRN [id number]. Admission date: 04/02/2017 Discharge Date 04/05/2017 Primary MD Adin Hector, MD Admitting Physician Fritzi Mandes, MD  Admission Diagnosis  Dehydration [E86.0] Acute kidney injury [facility]) [N17.9] Diarrhea, unspecified type [R19.7]  Discharge Diagnosis   Active Problems: acute Salmonella colitis Acute renal failure secondary to GI losses Hypokalemia Lower GI bleed related to colitis and diarrhea Diabetes type [place]  is a 66 y.o. female with a known history of COPD, diabetes, GERD, history of irritable bowel syndrome comes to the emergency room for with diarrhea on and off for last 12 days.  Patient was seen in the emergency room and had a CT scan which showed colitis.  She had stool PCR which showed Salmonella.  C. difficile was negative.  Patient was treated with Cipro.  Now her diarrhea is significantly improved.  She was seen in consultation by gastroenterology who recommended supportive care.  Her bleeding was thought to be due to colitis.  Her hemoglobin is stable.  Patient is doing much better and stable for discharge home.              Consults  GI  Significant Tests:  See full reports for all details     Ct Abdomen Pelvis W Contrast  Result Date: 04/02/2017 CLINICAL DATA:  Diarrhea and rectal bleeding. EXAM: CT ABDOMEN AND PELVIS WITH CONTRAST TECHNIQUE: Multidetector CT imaging of the abdomen and pelvis was performed using the standard protocol following bolus administration of intravenous contrast. CONTRAST:  24mL ISOVUE-300 IOPAMIDOL (ISOVUE-300) INJECTION 61% COMPARISON:  07/16/2011. FINDINGS: Lower chest: Tiny left lower lobe nodule on image number 4 series 4, without significant change. Hepatobiliary: Minimal diffuse low density of the liver relative to the spleen. The liver is mildly enlarged without significant  change. Normal appearing gallbladder. Pancreas: Unremarkable. No pancreatic ductal dilatation or surrounding inflammatory changes. Spleen: Borderline enlarged without focal abnormality. Adrenals/Urinary Tract: Upper pole right renal cysts. Normal appearing left kidney, ureters, urinary bladder and adrenal glands. Stomach/Bowel: Multiple colonic diverticula. Surgically absent right colon with a small bowel to colon anastomosis at the hepatic flexure. Interval mild diffuse colonic wall thickening. Unremarkable stomach and small bowel. Vascular/Lymphatic: Atheromatous arterial calcifications without aneurysm. Mildly prominent retroperitoneal and upper abdominal lymph nodes with no abnormally enlarged nodes. Reproductive: 1.3 cm rounded enhancing mass in the uterus on the left and 1.4 cm rounded enhancing mass in the uterus on the right. No adnexal masses. Other: 4.4 x 2.8 cm area curvilinear presacral soft tissue density with interspersed fat on image number 65 series 2, previously 3.8 x 1.8 cm. This measures 3.9 cm in length on sagittal image number 78. This appears to have a thin surrounding membrane with underlying fat on the sagittal images. Musculoskeletal: Lower thoracic spine degenerative changes and minimal lumbar spine degenerative changes. IMPRESSION: 1. Interval mild diffuse colonic wall thickening, possibly due to mild colitis. 2. Colonic diverticulosis. 3. Small uterine fibroids. 4. Mild increase in presacral curvilinear soft tissue density and fat density. This most likely represents postsurgical scarring/fat necrosis. This does not have the typical appearance of malignancy. 5. Minimal diffuse hepatic steatosis with improvement. 6. Stable mild hepatomegaly and borderline splenomegaly. Electronically Signed   By: Claudie Revering M.D.   On: 04/02/2017 17:50       Today   Subjective:   Brenda Beasley diarrhea significantly improved Objective:  Blood pressure (!) 170/66, pulse 65, temperature 98.8  F (37.1 C), resp. rate 14, height 4\' 11"  (1.499 m), weight 148 lb 9.6 oz (67.4 kg), SpO2 96 %.  .  Intake/Output Summary (Last 24 hours) at 04/05/2017 1343 Last data filed at 04/05/2017 1030 Gross per 24 hour  Intake 806 ml  Output -  Net 806 ml    Exam VITAL SIGNS: Blood pressure (!) 170/66, pulse 65, temperature 98.8 F (37.1 C), resp. rate 14, height 4\' 11"  (1.499 m), weight 148 lb 9.6 oz (67.4 kg), SpO2 96 %.  GENERAL:  66 y.o.-year-old patient lying in the bed with no acute distress.  EYES: Pupils equal, round, reactive to light and accommodation. No scleral icterus. Extraocular muscles intact.  HEENT: Head atraumatic, normocephalic. Oropharynx and nasopharynx clear.  NECK:  Supple, no jugular venous distention. No thyroid enlargement, no tenderness.  LUNGS: Normal breath sounds bilaterally, no wheezing, rales,rhonchi or crepitation. No use of accessory muscles of respiration.  CARDIOVASCULAR: S1, S2 normal. No murmurs, rubs, or gallops.  ABDOMEN: Soft, nontender, nondistended. Bowel sounds present. No organomegaly or mass.  EXTREMITIES: No pedal edema, cyanosis, or clubbing.  NEUROLOGIC: Cranial nerves II through XII are intact. Muscle strength 5/5 in all extremities. Sensation intact. Gait not checked.  PSYCHIATRIC: The patient is alert and oriented x 3.  SKIN: No obvious rash, lesion, or ulcer.   Data Review     CBC w Diff:  Lab Results  Component Value Date   WBC 10.2 04/02/2017   HGB 10.2 (L) 04/05/2017   HGB 13.0 07/20/2011   HCT 37.2 04/02/2017   HCT 39.5 07/20/2011   PLT 257 04/02/2017   PLT 195 07/20/2011   CMP:  Lab Results  Component Value Date   NA 135 04/05/2017   NA 138 07/20/2011   K 3.8 04/05/2017   K 4.2 07/20/2011   CL 107 04/05/2017   CL 96 (L) 07/20/2011   CO2 23 04/05/2017   CO2 26 07/20/2011   BUN 12 04/05/2017   BUN 19 (H) 07/20/2011   CREATININE 0.87 04/05/2017   CREATININE 0.91 07/20/2011   PROT 7.1 04/02/2017   PROT 7.6  07/20/2011   ALBUMIN 3.3 (L) 04/02/2017   ALBUMIN 3.7 07/20/2011   BILITOT 0.6 04/02/2017   BILITOT 0.3 07/20/2011   ALKPHOS 73 04/02/2017   ALKPHOS 119 07/20/2011   AST 22 04/02/2017   AST 28 07/20/2011   ALT 11 (L) 04/02/2017   ALT 37 07/20/2011  .  Micro Results Recent Results (from the past 240 hour(s))  Gastrointestinal Panel by PCR , Stool     Status: Abnormal   Collection Time: 04/02/17  6:35 PM  Result Value Ref Range Status   Campylobacter species NOT DETECTED NOT DETECTED Final   Plesimonas shigelloides NOT DETECTED NOT DETECTED Final   Salmonella species DETECTED (A) NOT DETECTED Final    Comment: RESULT CALLED TO, READ BACK BY AND VERIFIED WITH: DOROTHY RIGUERA AT 2035 ON 04/02/17 RWW    Yersinia enterocolitica NOT DETECTED NOT DETECTED Final   Vibrio species NOT DETECTED NOT DETECTED Final   Vibrio cholerae NOT DETECTED NOT DETECTED Final   Enteroaggregative E coli (EAEC) NOT DETECTED NOT DETECTED Final   Enteropathogenic E coli (EPEC) NOT DETECTED NOT DETECTED Final   Enterotoxigenic E coli (ETEC) NOT DETECTED NOT DETECTED Final   Shiga like toxin producing E coli (STEC) NOT DETECTED NOT DETECTED Final   Shigella/Enteroinvasive E coli (EIEC) NOT DETECTED NOT DETECTED Final   Cryptosporidium NOT  DETECTED NOT DETECTED Final   Cyclospora cayetanensis NOT DETECTED NOT DETECTED Final   Entamoeba histolytica NOT DETECTED NOT DETECTED Final   Giardia lamblia NOT DETECTED NOT DETECTED Final   Adenovirus F40/41 NOT DETECTED NOT DETECTED Final   Astrovirus NOT DETECTED NOT DETECTED Final   Norovirus GI/GII NOT DETECTED NOT DETECTED Final   Rotavirus A NOT DETECTED NOT DETECTED Final   Sapovirus (I, II, IV, and V) NOT DETECTED NOT DETECTED Final  Blood Culture (routine x 2)     Status: None (Preliminary result)   Collection Time: 04/02/17  6:35 PM  Result Value Ref Range Status   Specimen Description BLOOD BLOOD RIGHT HAND  Final   Special Requests   Final    BOTTLES  DRAWN AEROBIC AND ANAEROBIC Blood Culture results may not be optimal due to an inadequate volume of blood received in culture bottles   Culture NO GROWTH 3 DAYS  Final   Report Status PENDING  Incomplete  Blood Culture (routine x 2)     Status: None (Preliminary result)   Collection Time: 04/02/17  6:35 PM  Result Value Ref Range Status   Specimen Description BLOOD RIGHT ANTECUBITAL  Final   Special Requests   Final    BOTTLES DRAWN AEROBIC AND ANAEROBIC Blood Culture results may not be optimal due to an excessive volume of blood received in culture bottles   Culture NO GROWTH 3 DAYS  Final   Report Status PENDING  Incomplete  C difficile quick scan w PCR reflex     Status: None   Collection Time: 04/02/17  6:35 PM  Result Value Ref Range Status   C Diff antigen NEGATIVE NEGATIVE Final   C Diff toxin NEGATIVE NEGATIVE Final   C Diff interpretation No C. difficile detected.  Final        Code Status Orders  (From admission, onward)        Start     Ordered   04/02/17 2323  Full code  Continuous     04/02/17 2322    Code Status History    Date Active Date Inactive Code Status Order ID Comments User Context   This patient has a current code status but no historical code status.          Follow-up Information    Virgel Manifold, MD Follow up.   Specialty:  Gastroenterology Contact information: [place] [address] [phone]        Adin Hector, MD. Go on 04/20/2017.   Specialty:  Internal Medicine Why:  at 12:45 p.m. - hosp f/u Contact information: [place] [address] [phone]           Discharge Medications   Allergies as of 04/05/2017      Reactions   Other Shortness Of Breath, Hives   Cats hives Dust causes sneezing Affects breathing Tape-unknown   Penicillins Hives, Swelling   Throat swell Has patient had a PCN reaction causing immediate rash, facial/tongue/throat swelling, SOB or  lightheadedness with hypotension: Yes Has patient had a PCN reaction causing severe rash involving mucus membranes or skin necrosis: Unknown Has patient had a PCN reaction that required hospitalization: Unknown Has patient had a PCN reaction occurring within the last 10 years: Unknown If all of the above answers are "NO", then may proceed with Cephalosporin use.   Pioglitazone Other (See Comments), Palpitations   Chest pain, numbness in fingers,    Byetta 10 Mcg Pen [  exenatide] Other (See Comments)   Severe stomach pains   Victoza [liraglutide] Other (See Comments)   Severe stomach pains   Sulfa Antibiotics Rash      Medication List    STOP taking these medications   predniSONE 10 MG tablet Commonly known as:  DELTASONE   sitaGLIPtin 50 MG tablet Commonly known as:  JANUVIA   TRADJENTA 5 MG Tabs tablet Generic drug:  linagliptin     TAKE these medications   alendronate 70 MG tablet Commonly known as:  FOSAMAX Take 70 mg once a week by mouth. Take with a full glass of water on an empty stomach.   amLODipine 5 MG tablet Commonly known as:  NORVASC Take 5 mg by mouth daily.   aspirin EC 81 MG tablet Take 81 mg by mouth daily.   azelastine 0.1 % nasal spray Commonly known as:  ASTELIN Place 1 spray into the nose 2 (two) times daily.   benzonatate 200 MG capsule Commonly known as:  TESSALON Take 1 capsule by mouth 3 (three) times daily.   ciprofloxacin 500 MG tablet Commonly known as:  CIPRO Take 1 tablet (500 mg total) 2 (two) times daily for 4 days by mouth.   cyanocobalamin 1000 MCG/ML injection Commonly known as:  (VITAMIN B-12) Inject 1,000 mcg into the muscle every 30 (thirty) days.   ferrous sulfate 325 (65 FE) MG tablet Take 325 mg by mouth daily.   fluticasone 50 MCG/ACT nasal spray Commonly known as:  FLONASE Place 2 sprays into the nose daily.   gabapentin 100 MG capsule Commonly known as:  NEURONTIN Take 200 mg at bedtime by mouth.   glipiZIDE  10 MG tablet Commonly known as:  GLUCOTROL Take 20 mg 2 (two) times daily by mouth.   hydrochlorothiazide 25 MG tablet Commonly known as:  HYDRODIURIL Take 25 mg by mouth daily.   ipratropium-albuterol 0.5-2.5 (3) MG/3ML Soln Commonly known as:  DUONEB Take 3 mLs 4 (four) times daily as needed by nebulization.   lisinopril 40 MG tablet Commonly known as:  PRINIVIL,ZESTRIL Take 40 mg by mouth daily.   loperamide 2 MG capsule Commonly known as:  IMODIUM Take 1 capsule (2 mg total) every 6 (six) hours as needed by mouth for diarrhea or loose stools.   lovastatin 40 MG tablet Commonly known as:  MEVACOR Take 2 tablets by mouth daily.   MAGNESIUM PO Take daily by mouth.   metFORMIN 1000 MG tablet Commonly known as:  GLUCOPHAGE Take 1,000 mg by mouth 2 (two) times daily.   omeprazole 20 MG capsule Commonly known as:  PRILOSEC Take 20 mg by mouth 2 (two) times daily.   PROAIR HFA 108 (90 Base) MCG/ACT inhaler Generic drug:  albuterol Inhale 2 puffs into the lungs every 6 (six) hours as needed.   Vitamin D3 2000 units capsule Take 2 capsules by mouth daily.          Total Time in preparing paper work, data evaluation and todays exam - 35 minutes  Dustin Flock M.D on 04/05/2017 at 1:43 PM  [facility]   Office  [phone]

## 2017-04-05 NOTE — Care Management (Signed)
PCP is dr. Caryl Comes. Patient lives with son and she states he takes care of her. She states he is not able to drive but she is.  She denies need for home health services as she states she is not homebound and "does not want anyone telling her what to do".

## 2017-04-05 NOTE — Discharge Instructions (Signed)
[facility] [place] [facility] ° °DIET:  °Diabetic diet ° °DISCHARGE CONDITION:  °Stable ° °ACTIVITY:  °Activity as tolerated ° °OXYGEN:  °Home Oxygen: No. °  °Oxygen Delivery: room air ° °DISCHARGE LOCATION:  °home  ° ° °ADDITIONAL DISCHARGE INSTRUCTION: ° ° °If you experience worsening of your admission symptoms, develop shortness of breath, life threatening emergency, suicidal or homicidal thoughts you must seek medical attention immediately by calling 911 or calling your MD immediately  if symptoms less severe. ° °You Must read complete instructions/literature along with all the possible adverse reactions/side effects for all the Medicines you take and that have been prescribed to you. Take any new Medicines after you have completely understood and accpet all the possible adverse reactions/side effects.  ° °Please note ° °You were cared for by a hospitalist during your hospital stay. If you have any questions about your discharge medications or the care you received while you were in the hospital after you are discharged, you can call the unit and asked to speak with the hospitalist on call if the hospitalist that took care of you is not available. Once you are discharged, your primary care physician will handle any further medical issues. Please note that NO REFILLS for any discharge medications will be authorized once you are discharged, as it is imperative that you return to your primary care physician (or establish a relationship with a primary care physician if you do not have one) for your aftercare needs so that they can reassess your need for medications and monitor your lab values. ° ° °

## 2017-04-05 NOTE — Plan of Care (Signed)
Reported L arm pain 8/10, Dr. Jannifer Franklin paged, pain improved w/ ordered one-time PO Ibuprofen 400mg .  No other needs overnight.  Bed in low position, call bell within reach.  WCTM.

## 2017-04-05 NOTE — Progress Notes (Signed)
PHARMACIST - PHYSICIAN COMMUNICATION DR:   Posey Pronto CONCERNING: Antibiotic IV to Oral Route Change Policy  RECOMMENDATION: This patient is receiving Cipro by the intravenous route.  Based on criteria approved by the Pharmacy and Therapeutics Committee, the antibiotic(s) is/are being converted to the equivalent oral dose form(s).   DESCRIPTION: These criteria include:  The patient is not neutropenic and does not exhibit a GI malabsorption state  The patient is eating (either orally or via tube) and/or has been taking other orally administered medications for a least 24 hours  The patient is improving clinically and has a Tmax < 100.5  If you have questions about this conversion, please contact the Pharmacy Department  []   [phone] )  Brenda Beasley [x]   [phone] )  [facility] []   [phone] )  Brenda Beasley []   [phone] )  [facility] []   [phone] )  [facility]   Brenda Beasley, PharmD, BCPS 04/05/2017 10:43 AM

## 2017-04-05 NOTE — Progress Notes (Signed)
Signed      Expand All Collapse All       ELECTROLYTE CONSULT NOTE -follow up  Pharmacy Consult for electrolyte monitoring        Allergies  Allergen Reactions  . Other Shortness Of Breath and Hives    Cats hives Dust causes sneezing Affects breathing Tape-unknown  . Penicillins Hives and Swelling    Throat swell  Has patient had a PCN reaction causing immediate rash, facial/tongue/throat swelling, SOB or lightheadedness with hypotension: Yes Has patient had a PCN reaction causing severe rash involving mucus membranes or skin necrosis: Unknown Has patient had a PCN reaction that required hospitalization: Unknown Has patient had a PCN reaction occurring within the last 10 years: Unknown If all of the above answers are "NO", then may proceed with Cephalosporin use.   . Pioglitazone Other (See Comments) and Palpitations    Chest pain, numbness in fingers,   . Byetta 10 Mcg Pen [Exenatide] Other (See Comments)    Severe stomach pains  . Victoza [Liraglutide] Other (See Comments)    Severe stomach pains  . Sulfa Antibiotics Rash    Patient Measurements: Height: 4\' 11"  (149.9 cm) Weight: 148 lb 9.6 oz (67.4 kg) IBW/kg (Calculated) : 43.2  Vital Signs: Temp: 98.8 F (37.1 C) (11/18 1253) Temp Source: Oral (11/18 0526) BP: 119/44 (11/18 1253) Pulse Rate: 72 (11/18 1253) Intake/Output from previous day: 11/17 0701 - 11/18 0700 In: 2444 [P.O.:480; I.V.:1704; IV Piggyback:260] Out: -  Intake/Output from this shift: Total I/O In: 600 [P.O.:600] Out: -   Labs: RecentLabs(last2labs)       Recent Labs    04/02/17 1245 04/03/17 0821 04/04/17 0423  WBC 10.2  --   --   HGB 12.2  --   --   HCT 37.2  --   --   PLT 257  --   --   CREATININE 1.72* 1.22* 1.00  MG  --  1.5* 1.5*  PHOS  --  2.7  --   ALBUMIN 3.3*  --   --   PROT 7.1  --   --   AST 22  --   --   ALT 11*  --   --   ALKPHOS 73  --   --   BILITOT 0.6  --   --      Estimated  Creatinine Clearance: 45.6 mL/min (by C-G formula based on SCr of 1 mg/dL).   Microbiology:        Recent Results (from the past 720 hour(s))  Gastrointestinal Panel by PCR , Stool     Status: Abnormal   Collection Time: 04/02/17  6:35 PM  Result Value Ref Range Status   Campylobacter species NOT DETECTED NOT DETECTED Final   Plesimonas shigelloides NOT DETECTED NOT DETECTED Final   Salmonella species DETECTED (A) NOT DETECTED Final    Comment: RESULT CALLED TO, READ BACK BY AND VERIFIED WITH: DOROTHY RIGUERA AT 2035 ON 04/02/17 RWW    Yersinia enterocolitica NOT DETECTED NOT DETECTED Final   Vibrio species NOT DETECTED NOT DETECTED Final   Vibrio cholerae NOT DETECTED NOT DETECTED Final   Enteroaggregative E coli (EAEC) NOT DETECTED NOT DETECTED Final   Enteropathogenic E coli (EPEC) NOT DETECTED NOT DETECTED Final   Enterotoxigenic E coli (ETEC) NOT DETECTED NOT DETECTED Final   Shiga like toxin producing E coli (STEC) NOT DETECTED NOT DETECTED Final   Shigella/Enteroinvasive E coli (EIEC) NOT DETECTED NOT DETECTED Final   Cryptosporidium NOT DETECTED NOT DETECTED  Final   Cyclospora cayetanensis NOT DETECTED NOT DETECTED Final   Entamoeba histolytica NOT DETECTED NOT DETECTED Final   Giardia lamblia NOT DETECTED NOT DETECTED Final   Adenovirus F40/41 NOT DETECTED NOT DETECTED Final   Astrovirus NOT DETECTED NOT DETECTED Final   Norovirus GI/GII NOT DETECTED NOT DETECTED Final   Rotavirus A NOT DETECTED NOT DETECTED Final   Sapovirus (I, II, IV, and V) NOT DETECTED NOT DETECTED Final  Blood Culture (routine x 2)     Status: None (Preliminary result)   Collection Time: 04/02/17  6:35 PM  Result Value Ref Range Status   Specimen Description BLOOD BLOOD RIGHT HAND  Final   Special Requests   Final    BOTTLES DRAWN AEROBIC AND ANAEROBIC Blood Culture results may not be optimal due to an inadequate volume of blood received in culture bottles    Culture NO GROWTH 2 DAYS  Final   Report Status PENDING  Incomplete  Blood Culture (routine x 2)     Status: None (Preliminary result)   Collection Time: 04/02/17  6:35 PM  Result Value Ref Range Status   Specimen Description BLOOD RIGHT ANTECUBITAL  Final   Special Requests   Final    BOTTLES DRAWN AEROBIC AND ANAEROBIC Blood Culture results may not be optimal due to an excessive volume of blood received in culture bottles   Culture NO GROWTH 2 DAYS  Final   Report Status PENDING  Incomplete  C difficile quick scan w PCR reflex     Status: None   Collection Time: 04/02/17  6:35 PM  Result Value Ref Range Status   C Diff antigen NEGATIVE NEGATIVE Final   C Diff toxin NEGATIVE NEGATIVE Final   C Diff interpretation No C. difficile detected.  Final    Medical History:     Past Medical History:  Diagnosis Date  . COPD (chronic obstructive pulmonary disease) ([place])   . Diabetes mellitus without complication ([place])   . GERD (gastroesophageal reflux disease)   . Hyperlipidemia   . Hypertension   . IBS (irritable bowel syndrome)   . Personal history of tobacco use, presenting hazards to health 09/20/2015  . Stroke ([place])   . Tobacco abuse 10/04/2014  . Vitamin B 12 deficiency    Assessment:  66 yo female presents with diarrhea on and off for last 12 days. +salmonella. Patient reports 12-14 runny watery stools per day.    Goal of Therapy:  Electrolytes wnl  Plan:  Electrolytes within range today, no supplementation at this time.   F/u labs in am          Paulina Fusi, PharmD, BCPS 04/05/2017 10:37 AM

## 2017-04-05 NOTE — Care Management Important Message (Signed)
Important Message  Patient Details  Name: Brenda Beasley MRN: [id number] Date of Birth: Jul 23, 1950   Medicare Important Message Given:  Yes    Marshell Garfinkel, RN 04/05/2017, 8:19 AM

## 2017-04-05 NOTE — Progress Notes (Signed)
Pt has been discharged home. Discharge papers given and explained to pt. Pt verbalized understanding. Meds and f/u appointments reviewed. RX to be picked up from pharmacy. Pt is aware.

## 2017-04-07 LAB — CULTURE, BLOOD (ROUTINE X 2)
CULTURE: NO GROWTH
CULTURE: NO GROWTH

## 2017-04-14 ENCOUNTER — Ambulatory Visit (INDEPENDENT_AMBULATORY_CARE_PROVIDER_SITE_OTHER): Payer: Medicare Other | Admitting: Gastroenterology

## 2017-04-14 ENCOUNTER — Encounter: Payer: Self-pay | Admitting: Gastroenterology

## 2017-04-14 VITALS — BP 132/80 | HR 72 | Temp 97.7°F | Ht <= 58 in | Wt 148.4 lb

## 2017-04-14 DIAGNOSIS — E1165 Type 2 diabetes mellitus with hyperglycemia: Secondary | ICD-10-CM | POA: Insufficient documentation

## 2017-04-14 DIAGNOSIS — E538 Deficiency of other specified B group vitamins: Secondary | ICD-10-CM | POA: Insufficient documentation

## 2017-04-14 DIAGNOSIS — K219 Gastro-esophageal reflux disease without esophagitis: Secondary | ICD-10-CM | POA: Insufficient documentation

## 2017-04-14 DIAGNOSIS — M79604 Pain in right leg: Secondary | ICD-10-CM | POA: Diagnosis not present

## 2017-04-14 DIAGNOSIS — E118 Type 2 diabetes mellitus with unspecified complications: Secondary | ICD-10-CM

## 2017-04-14 DIAGNOSIS — IMO0002 Reserved for concepts with insufficient information to code with codable children: Secondary | ICD-10-CM | POA: Insufficient documentation

## 2017-04-14 DIAGNOSIS — A029 Salmonella infection, unspecified: Secondary | ICD-10-CM

## 2017-04-14 DIAGNOSIS — L500 Allergic urticaria: Secondary | ICD-10-CM | POA: Insufficient documentation

## 2017-04-14 DIAGNOSIS — M79605 Pain in left leg: Secondary | ICD-10-CM | POA: Diagnosis not present

## 2017-04-14 DIAGNOSIS — R3129 Other microscopic hematuria: Secondary | ICD-10-CM | POA: Insufficient documentation

## 2017-04-14 DIAGNOSIS — E782 Mixed hyperlipidemia: Secondary | ICD-10-CM | POA: Insufficient documentation

## 2017-04-14 DIAGNOSIS — T50905A Adverse effect of unspecified drugs, medicaments and biological substances, initial encounter: Secondary | ICD-10-CM

## 2017-04-14 DIAGNOSIS — E559 Vitamin D deficiency, unspecified: Secondary | ICD-10-CM | POA: Insufficient documentation

## 2017-04-14 DIAGNOSIS — K529 Noninfective gastroenteritis and colitis, unspecified: Secondary | ICD-10-CM | POA: Diagnosis not present

## 2017-04-14 NOTE — Progress Notes (Addendum)
Vonda Antigua, MD [address] [place], [place], [place], [address] [address], [place], [place], [place], [address] Phone: [phone]  Fax: [phone]  Consultation  Referring Provider:     Adin Hector, MD Primary Care Physician:  Adin Hector, MD Primary Gastroenterologist:  Virgel Manifold, MD         Date of Consultation:  04/14/2017         HPI:   Brenda Beasley is a 66 y.o. female here for hospital follow up. Pt. Was admitted with diarrhea and found to be positive for Salmonella and treated with Cipro, Flagyl and has completed her course of antibiotics. Diarrhea has improved. Now is having 1-2 BM a day, and was having 14-16 BM on admission. Has formed stool at this time without blood. Is aware of cooking food well, and washing fruits and vegetables well. Reports mild bilateral lower extremity discomfort Rt more than left. No associated with walking or exertion, present at rest as well.   Past Medical History:  Diagnosis Date  . COPD (chronic obstructive pulmonary disease) ([place])   . Diabetes mellitus without complication ([place])   . GERD (gastroesophageal reflux disease)   . Hyperlipidemia   . Hypertension   . IBS (irritable bowel syndrome)   . Personal history of tobacco use, presenting hazards to health 09/20/2015  . Stroke ([place])   . Tobacco abuse 10/04/2014  . Vitamin B 12 deficiency     Past Surgical History:  Procedure Laterality Date  . PARTIAL COLECTOMY    . TONSILLECTOMY    . VENTRAL HERNIA REPAIR      Prior to Admission medications   Medication Sig Start Date End Date Taking? Authorizing Provider  albuterol (PROAIR HFA) 108 (90 Base) MCG/ACT inhaler Inhale 2 puffs into the lungs every 6 (six) hours as needed. 03/05/15  Yes [provider]  alendronate (FOSAMAX) 70 MG tablet Take 70 mg once a week by mouth. Take with a full glass of water on an empty stomach.   Yes [provider]  amLODipine (NORVASC) 5 MG tablet  Take 5 mg by mouth daily. 03/05/15  Yes [provider]  aspirin EC 81 MG tablet Take 81 mg by mouth daily.   Yes [provider]  azelastine (ASTELIN) 0.1 % nasal spray Place 1 spray into the nose 2 (two) times daily. 09/03/15  Yes [provider]  benzonatate (TESSALON) 200 MG capsule Take 1 capsule by mouth 3 (three) times daily. 02/03/17  Yes [provider]  Cholecalciferol (VITAMIN D3) 2000 units capsule Take 2 capsules by mouth daily.   Yes [provider]  cyanocobalamin (,VITAMIN B-12,) 1000 MCG/ML injection Inject 1,000 mcg into the muscle every 30 (thirty) days. 10/23/14  Yes [provider]  ferrous sulfate 325 (65 FE) MG tablet Take 325 mg by mouth daily. 03/15/15  Yes [provider]  ferrous sulfate 325 (65 FE) MG tablet TAKE ONE (1) TABLET BY MOUTH EVERY DAY WITH BREAKFAST 12/03/16  Yes [provider]  fluticasone (FLONASE) 50 MCG/ACT nasal spray Place 2 sprays into the nose daily. 03/05/15  Yes [provider]  gabapentin (NEURONTIN) 100 MG capsule Take 200 mg at bedtime by mouth.   Yes [provider]  glipiZIDE (GLUCOTROL) 10 MG tablet Take 20 mg 2 (two) times daily by mouth.  03/15/15  Yes [provider]  hydrochlorothiazide (HYDRODIURIL) 25 MG tablet Take 25 mg by mouth daily. 03/05/15  Yes [provider]  ipratropium-albuterol (DUONEB) 0.5-2.5 (3) MG/3ML SOLN Take 3 mLs 4 (four) times daily as needed by nebulization.   Yes [provider]  linagliptin (TRADJENTA) 5 MG TABS tablet Take by mouth. 02/05/17  Yes [provider]  lisinopril (PRINIVIL,ZESTRIL) 40 MG tablet Take 40 mg by mouth daily. 03/05/15  Yes [provider]  loperamide (IMODIUM) 2 MG capsule Take 1 capsule (2 mg total) every 6 (six) hours as needed by mouth for diarrhea or loose stools. 04/05/17  Yes Dustin Flock, MD  lovastatin (MEVACOR) 40 MG tablet Take 2 tablets by mouth  daily. 03/05/15  Yes [provider]  MAGNESIUM PO Take daily by mouth.   Yes [provider]  MAGNESIUM PO Take by mouth.   Yes [provider]  metFORMIN (GLUCOPHAGE) 1000 MG tablet Take 1,000 mg by mouth 2 (two) times daily. 03/05/15  Yes [provider]  omeprazole (PRILOSEC) 20 MG capsule Take 20 mg by mouth 2 (two) times daily. 03/05/15  Yes [provider]  oseltamivir (TAMIFLU) 75 MG capsule  03/25/17  Yes [provider]  sitaGLIPtin (JANUVIA) 50 MG tablet Take by mouth. 02/03/17 02/03/18 Yes [provider]    Family History  Problem Relation Age of Onset  . Diabetes Mother   . Diabetes Father   . Diabetes Sister   . Diabetes Maternal Grandmother   . Diabetes Paternal Grandmother      Social History   Tobacco Use  . Smoking status: Current Every Day Smoker    Packs/day: 0.50    Years: 52.00    Pack years: 26.00    Types: Cigarettes  . Smokeless tobacco: Never Used  Substance Use Topics  . Alcohol use: No    Alcohol/week: 0.0 oz  . Drug use: Not on file    Allergies as of 04/14/2017 - Review Complete 04/14/2017  Allergen Reaction Noted  . Molds & smuts Shortness Of Breath 09/29/2013  . Other Shortness Of Breath and Hives 11/04/2015  . Penicillins Hives and Swelling 11/04/2015  . Pioglitazone Other (See Comments) and Palpitations 12/05/2015  . Byetta 10 mcg pen [exenatide] Other (See Comments) 11/04/2015  . Dapagliflozin Other (See Comments) 11/03/2016  . Victoza [liraglutide] Other (See Comments) 11/04/2015  . Sulfa antibiotics Rash 11/04/2015    Review of Systems:    All systems reviewed and negative except where noted in HPI.   Physical Exam:  Vital signs in last 24 hours: Vitals:   04/14/17 1023  BP: 132/80  Pulse: 72  Temp: 97.7 F (36.5 C)  TempSrc: Oral  Weight: 67.3 kg (148 lb 6.4 oz)  Height: 4\' 8"  (1.422 m)     General:   Pleasant, cooperative in NAD Head:  Normocephalic and  atraumatic. Eyes:   No icterus.   Conjunctiva pink. PERRLA. Ears:  Normal auditory acuity. Neck:  Supple; no masses or thyroidomegaly Lungs: Respirations even and unlabored. Lungs clear to auscultation bilaterally.   No wheezes, crackles, or rhonchi.  Heart:  Regular rate and rhythm;  Without murmur, clicks, rubs or gallops Abdomen:  Soft, nondistended, nontender. Normal bowel sounds. No appreciable masses or hepatomegaly.  No rebound or guarding.  Neurologic:  Alert and oriented x3;  grossly normal neurologically. Musculoskeletal: Homan's sign negative Skin:  Intact without significant lesions or rashes. Cervical Nodes:  No significant cervical adenopathy. Psych:  Alert and cooperative. Normal affect.  LAB RESULTS: No results for input(s): WBC, HGB, HCT, PLT in the last 72 hours. BMET No results for input(s):  NA, K, CL, CO2, GLUCOSE, BUN, CREATININE, CALCIUM in the last 72 hours. LFT No results for input(s): PROT, ALBUMIN, AST, ALT, ALKPHOS, BILITOT, BILIDIR, IBILI in the last 72 hours. PT/INR No results for input(s): LABPROT, INR in the last 72 hours.  STUDIES: No results found.    Impression / Plan:   Brenda Beasley is a 66 y.o. 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 Much improved clinically as far as her salmonella and colitis Encouraged to cook food well and wash her food well. Handout for the same given as well Last colonoscopy was in 2008 and has had right hemicolectomy at that time reportedly due to a large polyps.  Will try to obtain previous colonoscopy or operative reports. Will request PCP to send [name] these records if avaibale.  Plan would be follow up visit in 6-8 weeks and if doing well, colonoscopy for screening subsequently.  Will also obtain bloodwork at this time. Pt. Is insisting on having this done at [facility] clinic by Sunday Spillers (at her PCP office). She was made aware that this might not be possible today and if she chooses to wait  blood draw until she sees her PCP on Dec 4th, there might be electrolyte abnormalities that might need correction that won't be done until then. I have encouraged her to go to another lab if it cant be drawn at her PCP office today  Given her recent hospital stay and LE discomfort will get LE ultrasound to rule out DVT. Homan sign negative today. Low Magnesium (chronic issue for her, is on chronic replacement) can also cause these symptoms, hence will check labs. If these are negative, further evaluation by PCP and they might consider ABIs given her smoking history and evaluate for PVD/PAD.   Pt. Asked to contact us if diarrhea worsens or she develops blood in stool   Thank you for involving me in the care of this patient.     Virgel Manifold, MD  04/14/2017, 10:45 AM

## 2017-04-16 ENCOUNTER — Ambulatory Visit
Admission: RE | Admit: 2017-04-16 | Discharge: 2017-04-16 | Disposition: A | Discharge status: Home / Self Care | Payer: Medicare Other | Source: Ambulatory Visit | Attending: Gastroenterology | Admitting: Gastroenterology

## 2017-04-16 DIAGNOSIS — M79605 Pain in left leg: Secondary | ICD-10-CM | POA: Diagnosis not present

## 2017-04-16 DIAGNOSIS — M79604 Pain in right leg: Secondary | ICD-10-CM | POA: Diagnosis present

## 2017-04-20 ENCOUNTER — Telehealth: Payer: Self-pay

## 2017-04-20 NOTE — Telephone Encounter (Signed)
Pt has been informed leg ultrasound is normal- no blood clots.  She said she has done her labs and her magnesium is low.   Thanks [employer]

## 2017-04-20 NOTE — Telephone Encounter (Signed)
-----   Message from Virgel Manifold, MD sent at 04/19/2017  2:31 PM EST ----- Please let patient know that her leg ultrasound did not show any blood clots. Please ask her to get bloodwork done soon. She wanted it done at her primary care physicians office and she is seeing them tomorrow. Please ask her to follow up with them.

## 2017-04-22 NOTE — Telephone Encounter (Signed)
I have called the pt. And left a voicemail requesting a call back. Her Magnesium was low at blood test at PCP office. ?Is she on replacement. It is listed on her med list but is she taking it, and how much. What did her PCP say after the bloodwork results?

## 2017-04-29 ENCOUNTER — Telehealth: Payer: Self-pay

## 2017-04-29 NOTE — Telephone Encounter (Signed)
Patient said Dr. Caryl Comes increased her magnesium from one to two.  Did not address this in the reply message to you.  Thanks [employer]

## 2017-05-24 ENCOUNTER — Ambulatory Visit (INDEPENDENT_AMBULATORY_CARE_PROVIDER_SITE_OTHER): Payer: Medicare Other | Admitting: Gastroenterology

## 2017-05-24 ENCOUNTER — Encounter (INDEPENDENT_AMBULATORY_CARE_PROVIDER_SITE_OTHER): Payer: Self-pay

## 2017-05-24 ENCOUNTER — Encounter: Payer: Self-pay | Admitting: Gastroenterology

## 2017-05-24 ENCOUNTER — Other Ambulatory Visit: Payer: Self-pay

## 2017-05-24 VITALS — BP 147/74 | HR 67 | Ht <= 58 in | Wt 151.4 lb

## 2017-05-24 DIAGNOSIS — Z86010 Personal history of colonic polyps: Secondary | ICD-10-CM | POA: Diagnosis not present

## 2017-05-24 DIAGNOSIS — A029 Salmonella infection, unspecified: Secondary | ICD-10-CM | POA: Diagnosis not present

## 2017-05-24 NOTE — Progress Notes (Signed)
Brenda Antigua, MD [address]  [place]  [place], Bren[place] [address]  Main: [phone]  Fax: [phone]   Primary Care Physician: Adin Hector, MD  Primary Gastroenterologist:  Dr. Vonda Beasley  Chief complaint: Follow-up for Salmonella diarrhea  HPI: Brenda Beasley is a 67 y.o. female but recent hospital admission for diarrhea.  Found to be positive for Salmonella.  Symptoms have now resolved.  Is having 2 formed bowel movements daily.  Without any blood.  Denies any abdominal pain.  Normal appetite.  Saw her primary care provider recently and they increased her magnesium dosage due to her low magnesium.  She is following up with them in this regard and taking the higher dose magnesium as recommended.  Lower extremity ultrasound was done after last visit due to her lower extremity discomfort and did not show any DVTs.  Current Outpatient Medications  Medication Sig Dispense Refill  . albuterol (PROAIR HFA) 108 (90 Base) MCG/ACT inhaler Inhale 2 puffs into the lungs every 6 (six) hours as needed.    Marland Kitchen alendronate (FOSAMAX) 70 MG tablet Take 70 mg once a week by mouth. Take with a full glass of water on an empty stomach.    Marland Kitchen aspirin EC 81 MG tablet Take 81 mg by mouth daily.    Marland Kitchen azelastine (ASTELIN) 0.1 % nasal spray Place 1 spray into the nose 2 (two) times daily.    . Cholecalciferol (VITAMIN D3) 2000 units capsule Take 2 capsules by mouth daily.    . cyanocobalamin (,VITAMIN B-12,) 1000 MCG/ML injection Inject 1,000 mcg into the muscle every 30 (thirty) days.    . ferrous sulfate 325 (65 FE) MG tablet Take 325 mg by mouth daily.    . fluticasone (FLONASE) 50 MCG/ACT nasal spray Place 2 sprays into the nose daily.    Marland Kitchen amLODipine (NORVASC) 5 MG tablet Take 5 mg by mouth daily.    . benzonatate (TESSALON) 200 MG capsule Take 1 capsule by mouth 3 (three) times daily.    . ferrous sulfate 325 (65 FE) MG tablet TAKE ONE (1) TABLET BY MOUTH EVERY DAY WITH  BREAKFAST    . gabapentin (NEURONTIN) 100 MG capsule Take 200 mg at bedtime by mouth.    Marland Kitchen glipiZIDE (GLUCOTROL) 10 MG tablet Take 20 mg 2 (two) times daily by mouth.     . hydrochlorothiazide (HYDRODIURIL) 25 MG tablet Take 25 mg by mouth daily.    Marland Kitchen ipratropium-albuterol (DUONEB) 0.5-2.5 (3) MG/3ML SOLN Take 3 mLs 4 (four) times daily as needed by nebulization.    Marland Kitchen linagliptin (TRADJENTA) 5 MG TABS tablet Take by mouth.    Marland Kitchen lisinopril (PRINIVIL,ZESTRIL) 40 MG tablet Take 40 mg by mouth daily.    Marland Kitchen loperamide (IMODIUM) 2 MG capsule Take 1 capsule (2 mg total) every 6 (six) hours as needed by mouth for diarrhea or loose stools. 30 capsule 0  . lovastatin (MEVACOR) 40 MG tablet Take 2 tablets by mouth daily.    Marland Kitchen MAGNESIUM PO Take daily by mouth.    . metFORMIN (GLUCOPHAGE) 1000 MG tablet Take 1,000 mg by mouth 2 (two) times daily.    Marland Kitchen omeprazole (PRILOSEC) 20 MG capsule Take 20 mg by mouth 2 (two) times daily.    Marland Kitchen oseltamivir (TAMIFLU) 75 MG capsule     . sitaGLIPtin (JANUVIA) 50 MG tablet Take by mouth.     No current facility-administered medications for this visit.     Allergies as of 05/24/2017 -  Review Complete 05/24/2017  Allergen Reaction Noted  . Molds & smuts Shortness Of Breath 09/29/2013  . Other Shortness Of Breath and Hives 11/04/2015  . Penicillins Hives and Swelling 11/04/2015  . Pioglitazone Other (See Comments) and Palpitations 12/05/2015  . Byetta 10 mcg pen [exenatide] Other (See Comments) 11/04/2015  . Dapagliflozin Other (See Comments) 11/03/2016  . Victoza [liraglutide] Other (See Comments) 11/04/2015  . Sulfa antibiotics Rash 11/04/2015    ROS:  General: Negative for anorexia, weight loss, fever, chills, fatigue, weakness. ENT: Negative for hoarseness, difficulty swallowing , nasal congestion. CV: Negative for chest pain, angina, palpitations, dyspnea on exertion, peripheral edema.  Respiratory: Negative for dyspnea at rest, dyspnea on exertion, cough,  sputum, wheezing.  GI: See history of present illness. GU:  Negative for dysuria, hematuria, urinary incontinence, urinary frequency, nocturnal urination.  Endo: Negative for unusual weight change.    Physical Examination:   BP (!) 147/74   Pulse 67   Ht 4\' 8"  (1.422 m)   Wt 151 lb 6.4 oz (68.7 kg)   BMI 33.94 kg/m   General: Well-nourished, well-developed in no acute distress.  Eyes: No icterus. Conjunctivae pink. Mouth: Oropharyngeal mucosa moist and pink , no lesions erythema or exudate. Lungs: Clear to auscultation bilaterally. Non-labored. Heart: Regular rate and rhythm, no murmurs rubs or gallops.  Abdomen: Bowel sounds are normal, nontender, nondistended, no hepatosplenomegaly or masses, no abdominal bruits or hernia , no rebound or guarding.   Extremities: No lower extremity edema. No clubbing or deformities. Neuro: Alert and oriented x 3.  Grossly intact. Skin: Warm and dry, no jaundice.   Psych: Alert and cooperative, normal mood and affect.   Labs: CMP     Component Value Date/Time   NA 135 04/05/2017 0438   NA 138 07/20/2011 0005   K 3.8 04/05/2017 0438   K 4.2 07/20/2011 0005   CL 107 04/05/2017 0438   CL 96 (L) 07/20/2011 0005   CO2 23 04/05/2017 0438   CO2 26 07/20/2011 0005   GLUCOSE 184 (H) 04/05/2017 0438   GLUCOSE 345 (H) 07/20/2011 0005   BUN 12 04/05/2017 0438   BUN 19 (H) 07/20/2011 0005   CREATININE 0.87 04/05/2017 0438   CREATININE 0.91 07/20/2011 0005   CALCIUM 8.5 (L) 04/05/2017 0438   CALCIUM 9.3 07/20/2011 0005   PROT 7.1 04/02/2017 1245   PROT 7.6 07/20/2011 0005   ALBUMIN 3.3 (L) 04/02/2017 1245   ALBUMIN 3.7 07/20/2011 0005   AST 22 04/02/2017 1245   AST 28 07/20/2011 0005   ALT 11 (L) 04/02/2017 1245   ALT 37 07/20/2011 0005   ALKPHOS 73 04/02/2017 1245   ALKPHOS 119 07/20/2011 0005   BILITOT 0.6 04/02/2017 1245   BILITOT 0.3 07/20/2011 0005   GFRNONAA >60 04/05/2017 0438   GFRNONAA >60 07/20/2011 0005   GFRAA >60 04/05/2017  0438   GFRAA >60 07/20/2011 0005   Lab Results  Component Value Date   WBC 10.2 04/02/2017   HGB 10.2 (L) 04/05/2017   HCT 37.2 04/02/2017   MCV 79.4 (L) 04/02/2017   PLT 257 04/02/2017    Imaging Studies: No results found.  Assessment and Plan:   MOOREA BOISSONNEAULT is a 67 y.o. y/o female here for follow-up for Salmonella colitis and history of colon polyps  Salmonella colitis Symptoms resolved at this time Only 2 formed BM a day without any blood.  History of colon polyps I am not able to obtain patient's previous surgical report, but in provation,  a colonoscopy report from 2008 shows a 20 mm cecal polyp and a 25 mm transverse colon polyp that was biopsied.  Pathology report from this is not available but patient underwent partial colectomy after this. There is a 2011 pathology report of a rectal polyp which were hyperplastic.  The colonoscopy report from this pathology report is not available either. Due to her previous history of advanced colon adenomas, a colonoscopy is indicated at this time.  In addition, she has a family history of colon cancer in her sister.  I have discussed this with her, and she is not keen on scheduling the colonoscopy at this time.  She states if anything is found she is not willing to undergo another surgery.  However, I explained to her that the colonoscopy would be to remove any polyps that might be present and to prevent them from growing bigger or developing into cancer.  She would like to think about it at this time and will call us if she is ready to schedule this.  If she does not want to schedule, she will consider getting stool testing with her PCP.  PCP can consider stool DNA testing if patient is unwilling to undergo colonoscopy however given her significant history of large colon polyps, colon visualization for polyps is likely the best next step for her. She recently received steroids for a cough, if she is willing to undergo colonoscopy, would  recommend doing this in 4-6 weeks to allow upper respiratory symptoms to resolve.  She understands the risks of missing colon polyps or colon malignancy if she is unwilling to undergo colonoscopy.  Her hesitation is the prep.  A CT colonoscopy would also allow for colon and visualization with imaging however would require a prep as well.  Will encourage colonoscopy on subsequent visits as well.   Dr Brenda Beasley

## 2017-05-24 NOTE — Patient Instructions (Signed)
To contact office when ready to schedule colonoscopy. F/U 3 months.

## 2017-06-22 ENCOUNTER — Telehealth: Payer: Self-pay | Admitting: Gastroenterology

## 2017-06-22 NOTE — Telephone Encounter (Signed)
CALLED patient to reschedule due to scoping. She stated she is doing better and doesn't need to come in.

## 2017-08-23 ENCOUNTER — Ambulatory Visit: Payer: Medicare Other | Admitting: Gastroenterology

## 2017-10-04 ENCOUNTER — Telehealth: Payer: Self-pay | Admitting: *Deleted

## 2017-10-04 DIAGNOSIS — Z87891 Personal history of nicotine dependence: Secondary | ICD-10-CM

## 2017-10-04 DIAGNOSIS — Z122 Encounter for screening for malignant neoplasm of respiratory organs: Secondary | ICD-10-CM

## 2017-10-04 NOTE — Telephone Encounter (Signed)
Notified patient that annual lung cancer screening low dose CT scan is due currently or will be in near future. Confirmed that patient is within the age range of 55-77, and asymptomatic, (no signs or symptoms of lung cancer). Patient denies illness that would prevent curative treatment for lung cancer if found. Verified smoking history, (current, 51 pack year). The shared decision making visit was done 10/05/14. Patient is agreeable for CT scan being scheduled.

## 2017-10-12 ENCOUNTER — Ambulatory Visit
Admission: RE | Admit: 2017-10-12 | Discharge: 2017-10-12 | Disposition: A | Discharge status: Home / Self Care | Payer: Medicare Other | Source: Ambulatory Visit | Attending: Nurse Practitioner | Admitting: Nurse Practitioner

## 2017-10-12 DIAGNOSIS — I77810 Thoracic aortic ectasia: Secondary | ICD-10-CM | POA: Diagnosis not present

## 2017-10-12 DIAGNOSIS — Z87891 Personal history of nicotine dependence: Secondary | ICD-10-CM | POA: Diagnosis present

## 2017-10-12 DIAGNOSIS — Z122 Encounter for screening for malignant neoplasm of respiratory organs: Secondary | ICD-10-CM

## 2017-10-12 DIAGNOSIS — I2510 Atherosclerotic heart disease of native coronary artery without angina pectoris: Secondary | ICD-10-CM | POA: Diagnosis not present

## 2017-10-12 DIAGNOSIS — I700 Atherosclerosis of aorta: Secondary | ICD-10-CM | POA: Insufficient documentation

## 2017-10-12 DIAGNOSIS — J439 Emphysema, unspecified: Secondary | ICD-10-CM | POA: Insufficient documentation

## 2017-10-17 ENCOUNTER — Other Ambulatory Visit: Payer: Self-pay

## 2017-10-17 ENCOUNTER — Emergency Department: Payer: Medicare Other

## 2017-10-17 ENCOUNTER — Emergency Department
Admission: EM | Admit: 2017-10-17 | Discharge: 2017-10-17 | Disposition: A | Discharge status: Home / Self Care | Payer: Medicare Other | Attending: Emergency Medicine | Admitting: Emergency Medicine

## 2017-10-17 DIAGNOSIS — F17210 Nicotine dependence, cigarettes, uncomplicated: Secondary | ICD-10-CM | POA: Insufficient documentation

## 2017-10-17 DIAGNOSIS — Z7984 Long term (current) use of oral hypoglycemic drugs: Secondary | ICD-10-CM | POA: Diagnosis not present

## 2017-10-17 DIAGNOSIS — R05 Cough: Secondary | ICD-10-CM | POA: Diagnosis present

## 2017-10-17 DIAGNOSIS — R042 Hemoptysis: Secondary | ICD-10-CM | POA: Diagnosis not present

## 2017-10-17 DIAGNOSIS — R0789 Other chest pain: Secondary | ICD-10-CM | POA: Insufficient documentation

## 2017-10-17 DIAGNOSIS — Z8673 Personal history of transient ischemic attack (TIA), and cerebral infarction without residual deficits: Secondary | ICD-10-CM | POA: Insufficient documentation

## 2017-10-17 DIAGNOSIS — J449 Chronic obstructive pulmonary disease, unspecified: Secondary | ICD-10-CM | POA: Insufficient documentation

## 2017-10-17 DIAGNOSIS — Z79899 Other long term (current) drug therapy: Secondary | ICD-10-CM | POA: Insufficient documentation

## 2017-10-17 DIAGNOSIS — I10 Essential (primary) hypertension: Secondary | ICD-10-CM | POA: Insufficient documentation

## 2017-10-17 DIAGNOSIS — E119 Type 2 diabetes mellitus without complications: Secondary | ICD-10-CM | POA: Insufficient documentation

## 2017-10-17 DIAGNOSIS — R0981 Nasal congestion: Secondary | ICD-10-CM | POA: Diagnosis not present

## 2017-10-17 MED ORDER — HYDROCODONE-HOMATROPINE 5-1.5 MG/5ML PO SYRP: 5.0000 mL | ORAL_SOLUTION | Freq: Four times a day (QID) | ORAL | 0 refills | Status: DC | PRN

## 2017-10-17 MED ORDER — DEXAMETHASONE 10 MG/ML FOR PEDIATRIC ORAL USE
10.0000 mg | Freq: Once | INTRAMUSCULAR | Status: AC
  Administered 2017-10-17: 10 mg via ORAL
  Filled 2017-10-17: qty 1

## 2017-10-17 NOTE — ED Provider Notes (Signed)
[facility] Emergency Department Provider Note  ____________________________________________   First MD Initiated Contact with Patient 10/17/17 1559     (approximate)  I have reviewed the triage vital signs and the nursing notes.   HISTORY  Chief Complaint Hemoptysis    HPI Brenda Beasley is a 67 y.o. female with COPD and who still smokes daily who presents for evaluation of a cough for the last couple of days with clear productive sputum that as of today was mixed with a small amount of blood.  The patient has COPD and states that she coughs frequently and regularly, however she believes that her symptoms are worse because of exposure to an insect spray.  She reports that she was  Spraying for wasps when the wind change direction and blew into her face several days ago.  Since then she is felt nasal congestion and "raw in the chest" with a persistent productive cough.  The sputum has been clear until yesterday evening when she coughed up a small amount of blood.  This happened again today and she saved the bloody sputum in a Kleenex to show me.  She reports that the symptoms are mild but they concern her because of the exposure to the insect spray she is worried that her lungs might be damaged.  She otherwise has no symptoms.  Nothing particular makes her symptoms better or worse.  She denies fever/chills, chest pain, shortness of breath except for when associated with cough, nausea, vomiting, and abdominal pain.  She sees Dr. Raul Del for pulmonology care.  She has been using her breathing treatments.  She is not on any blood thinners except for a baby aspirin.   Past Medical History:  Diagnosis Date  . COPD (chronic obstructive pulmonary disease) ([place])   . Diabetes mellitus without complication ([place])   . GERD (gastroesophageal reflux disease)   . Hyperlipidemia   . Hypertension   . IBS (irritable bowel syndrome)   . Personal history of tobacco use,  presenting hazards to health 09/20/2015  . Stroke ([place])   . Tobacco abuse 10/04/2014  . Vitamin B 12 deficiency     Patient Active Problem List   Diagnosis Date Noted  . B12 deficiency 04/14/2017  . Drug-induced urticaria 04/14/2017  . GERD (gastroesophageal reflux disease) 04/14/2017  . Hyperlipidemia, mixed 04/14/2017  . Microscopic hematuria 04/14/2017  . Uncontrolled type 2 diabetes mellitus with complication, without long-term current use of insulin ([place]) 04/14/2017  . Vitamin D deficiency, unspecified 04/14/2017  . Colitis 04/02/2017  . Age-related osteoporosis without current pathological fracture 11/13/2016  . Thoracic aortic atherosclerosis ([place]) 01/07/2016  . Facet arthritis of lumbar region 12/19/2015  . Personal history of tobacco use, presenting hazards to health 09/20/2015  . COPD with emphysema ([place]) 09/03/2015  . Essential hypertension 09/03/2015  . Proteinuria 03/05/2015  . Tobacco abuse 10/04/2014  . DDD (degenerative disc disease), lumbar 04/05/2014  . Greater trochanteric bursitis of both hips 04/05/2014  . Lumbar radiculopathy 04/05/2014    Past Surgical History:  Procedure Laterality Date  . PARTIAL COLECTOMY    . TONSILLECTOMY    . VENTRAL HERNIA REPAIR      Prior to Admission medications   Medication Sig Start Date End Date Taking? Authorizing Provider  albuterol (PROAIR HFA) 108 (90 Base) MCG/ACT inhaler Inhale 2 puffs into the lungs every 6 (six) hours as needed. 03/05/15  Yes [provider]  amLODipine (NORVASC) 5 MG tablet Take 2.5 mg by mouth daily.  03/05/15  Yes [provider]  Celedonio Miyamoto 62.5-25 MCG/INH AEPB Take 1 Inhaler by mouth daily. 09/08/17  Yes [provider]  aspirin EC 81 MG tablet Take 81 mg by mouth daily.   Yes [provider]  CALCIUM PO Take 1 tablet by mouth every evening.   Yes [provider]  Cholecalciferol (VITAMIN D3) 5000 units TABS Take 5,000 Units by mouth daily.    Yes  [provider]  cyanocobalamin (,VITAMIN B-12,) 1000 MCG/ML injection Inject 1,000 mcg into the muscle every 30 (thirty) days. 10/23/14  Yes [provider]  ferrous sulfate 325 (65 FE) MG tablet Take 325 mg by mouth daily. 03/15/15  Yes [provider]  fluticasone (FLONASE) 50 MCG/ACT nasal spray Place 2 sprays into the nose daily. 03/05/15  Yes [provider]  glipiZIDE (GLUCOTROL) 10 MG tablet Take 20 mg 2 (two) times daily by mouth.  03/15/15  Yes [provider]  hydrochlorothiazide (HYDRODIURIL) 25 MG tablet Take 12.5 mg by mouth daily.  03/05/15  Yes [provider]  ipratropium-albuterol (DUONEB) 0.5-2.5 (3) MG/3ML SOLN Take 3 mLs 4 (four) times daily as needed by nebulization.   Yes [provider]  linagliptin (TRADJENTA) 5 MG TABS tablet Take 5 mg by mouth daily.  02/05/17  Yes [provider]  lisinopril (PRINIVIL,ZESTRIL) 40 MG tablet Take 20 mg by mouth daily.  03/05/15  Yes [provider]  loperamide (IMODIUM) 2 MG capsule Take 1 capsule (2 mg total) every 6 (six) hours as needed by mouth for diarrhea or loose stools. 04/05/17  Yes Dustin Flock, MD  lovastatin (MEVACOR) 40 MG tablet Take 2 tablets by mouth daily. 03/05/15  Yes [provider]  MAGNESIUM PO Take 1 tablet by mouth at bedtime.    Yes [provider]  metFORMIN (GLUCOPHAGE) 1000 MG tablet Take 1,000 mg by mouth 2 (two) times daily. 03/05/15  Yes [provider]  omeprazole (PRILOSEC) 20 MG capsule Take 20 mg by mouth 2 (two) times daily. 03/05/15  Yes [provider]  HYDROcodone-homatropine (HYCODAN) 5-1.5 MG/5ML syrup Take 5 mLs by mouth every 6 (six) hours as needed for cough. 10/17/17   Hinda Kehr, MD    Allergies Molds & smuts; Other; Penicillins; Pioglitazone; Byetta 10 mcg pen [exenatide]; Dapagliflozin; Victoza [liraglutide]; and Sulfa antibiotics  Family History  Problem Relation Age of  Onset  . Diabetes Mother   . Diabetes Father   . Diabetes Sister   . Diabetes Maternal Grandmother   . Diabetes Paternal Grandmother     Social History Social History   Tobacco Use  . Smoking status: Current Every Day Smoker    Packs/day: 0.50    Years: 52.00    Pack years: 26.00    Types: Cigarettes  . Smokeless tobacco: Never Used  Substance Use Topics  . Alcohol use: No    Alcohol/week: 0.0 oz  . Drug use: Not on file    Review of Systems Constitutional: No fever/chills Eyes: No visual changes. ENT: No sore throat. Cardiovascular: Denies chest pain. Respiratory: Cough productive of clear sputum tinged with blood over the last 24 hours or so as described above Gastrointestinal: No abdominal pain.  No nausea, no vomiting.  No diarrhea.  No constipation. Genitourinary: Negative for dysuria. Musculoskeletal: Negative for neck pain.  Negative for back pain. Integumentary: Negative for rash. Neurological: Negative for headaches, focal weakness or numbness.   ____________________________________________   PHYSICAL EXAM:  VITAL SIGNS: ED Triage Vitals  Enc Vitals  Group     BP 10/17/17 1504 (!) 147/62     Pulse Rate 10/17/17 1504 74     Resp 10/17/17 1504 18     Temp 10/17/17 1504 97.8 F (36.6 C)     Temp Source 10/17/17 1504 Oral     SpO2 10/17/17 1504 94 %     Weight 10/17/17 1505 70.8 kg (156 lb)     Height 10/17/17 1505 1.499 m (4\' 11" )     Head Circumference --      Peak Flow --      Pain Score 10/17/17 1505 3     Pain Loc --      Pain Edu? --      Excl. in [place]? --     Constitutional: Alert and oriented. Well appearing and in no acute distress. Eyes: Conjunctivae are normal.  Head: Atraumatic. Nose: No congestion/rhinnorhea. Mouth/Throat: Mucous membranes are moist. Neck: No stridor.  No meningeal signs.   Cardiovascular: Normal rate, regular rhythm. Good peripheral circulation. Grossly normal heart sounds. Respiratory: Normal respiratory effort.   No retractions.  Mild expiratory wheezing with some coarse breath sounds throughout but no focal abnormalities. Gastrointestinal: Soft and nontender. No distention.  Musculoskeletal: No lower extremity tenderness nor edema. No gross deformities of extremities. Neurologic:  Normal speech and language. No gross focal neurologic deficits are appreciated.  Skin:  Skin is warm, dry and intact. No rash noted. Psychiatric: Mood and affect are normal. Speech and behavior are normal.  ____________________________________________   LABS (all labs ordered are listed, but only abnormal results are displayed)  Labs Reviewed - No data to display ____________________________________________  EKG  None - EKG not ordered by ED physician ____________________________________________  RADIOLOGY I, Hinda Kehr, personally viewed and evaluated these images (plain radiographs) as part of my medical decision making, as well as reviewing the written report by the radiologist.  ED MD interpretation: COPD without evidence of pneumonitis/pneumonia  Official radiology report(s): Dg Chest 2 View  Result Date: 10/17/2017 CLINICAL DATA:  Cough for 5-6 days. EXAM: CHEST - 2 VIEW COMPARISON:  CT chest 10/12/2017.  PA and lateral chest 03/30/2016. FINDINGS: The lungs are emphysematous but clear. Heart size is normal. Aortic atherosclerosis is noted. No pneumothorax or pleural effusion. IMPRESSION: Emphysema without acute disease. Atherosclerosis. Electronically Signed   By: Inge Rise M.D.   On: 10/17/2017 15:51    ____________________________________________   PROCEDURES  Critical Care performed: No   Procedure(s) performed:   Procedures   ____________________________________________   INITIAL IMPRESSION / ASSESSMENT AND PLAN / ED COURSE  As part of my medical decision making, I reviewed the following data within the [place] History obtained from family, Nursing notes  reviewed and incorporated, Labs reviewed  and Radiograph reviewed     Differential diagnosis includes, but is not limited to, bronchitis, pneumonia, lung injury secondary to chemical exposure, chemical pneumonitis, AV malformation, neoplasm.  The patient's symptoms seem to have developed after an exposure to the insect spray and I believe she has some degree of irritation or exacerbation of her chronic COPD as a result.  Her vital signs are stable and within normal limits and she is in no distress, just with a frequent cough.  Her breath sounds are most consistent with a COPD exacerbation.  She has breathing treatments at home.  There is no indication for further evaluation such as a CT scan given the small amount of hemoptysis; she showed me the tissue into which she coughed and  it was literally just a small dot of blood.  I believe that a CT scan is unlikely to reveal any further concerning acute or emergent conditions.  I gave the patient a dose of Decadron to help with inflammation over the next several days rather than starting her on prednisone which I believe is more likely to affect her blood glucose level over an extended period of time.  She will follow-up with Dr. Raul Del.  I gave my usual customary return precautions and she understands and agrees with the plan.     ____________________________________________  FINAL CLINICAL IMPRESSION(S) / ED DIAGNOSES  Final diagnoses:  Cough with hemoptysis     MEDICATIONS GIVEN DURING THIS VISIT:  Medications  dexamethasone (DECADRON) 10 MG/ML injection for Pediatric ORAL use 10 mg (10 mg Oral Given 10/17/17 1809)     ED Discharge Orders        Ordered    HYDROcodone-homatropine (HYCODAN) 5-1.5 MG/5ML syrup  Every 6 hours PRN     10/17/17 1744       Note:  This document was prepared using Dragon voice recognition software and may include unintentional dictation errors.    Hinda Kehr, MD 10/17/17 2300

## 2017-10-17 NOTE — ED Notes (Signed)
Pt alert, oriented. Family member at bedside.   States coughing up clear sputum mixed with blood that began yesterday. Happened again today around 11 and happened just prior to this RN entering room.   Hx stomach ulcers per family member.   Family member answering questions for pt. States she felt warm a few nights ago. States she has been coughing for a while. He states that the pt was blowing wasp spray and that "the wind blew it back into her face." pt states she felt cold symptoms and "raw in the chest." pt unsure if symptoms and wasp spray are related.

## 2017-10-17 NOTE — ED Triage Notes (Signed)
Pt c/o cough with congestion with clear sputum since Monday with fever. States today had blood mixed with clear sputum states she is concerned it is related to spraying wasp on Monday and some of it flew back into her face. Pt is a/ox4 on arrival, in NAD. Respirations WNL.Marland Kitchen

## 2017-10-17 NOTE — ED Notes (Signed)
Pt ambulatory to toilet with steady gait noted.  

## 2017-10-17 NOTE — Discharge Instructions (Signed)
As we discussed, most likely you are having a flare of your bronchitis with possibly some chemical irritation from the insect spray as well.  There is no evidence of pneumonia or fluid on your chest x-ray.  We gave you a dose of Decadron 10 mg by mouth which should help ease up the inflammation over the next couple of days.  Please continue using your breathing treatments.  Follow-up with Dr. Raul Del or your primary care doctor at the next available opportunity.  Return to the emergency department if you develop new or worsening symptoms that concern you.

## 2017-10-17 NOTE — ED Notes (Signed)
Pt in x-ray at this time

## 2017-10-18 ENCOUNTER — Telehealth: Payer: Self-pay | Admitting: *Deleted

## 2017-10-18 NOTE — Telephone Encounter (Signed)
Notified patient of LDCT lung cancer screening program results with recommendation for 6 month follow up imaging. Also notified of incidental findings noted below and is encouraged to discuss further with PCP who will receive a copy of this note and/or the CT report. Patient verbalizes understanding.   IMPRESSION: 1. Lung-RADS 3, probably benign findings. Short-term follow-up in 6 months is recommended with repeat low-dose chest CT without contrast (please use the following order, "CT CHEST LCS NODULE FOLLOW-UP W/O CM"). 2. Three-vessel coronary atherosclerosis. 3. Stable ectatic 3.7 cm descending thoracic aorta.  Aortic Atherosclerosis (ICD10-I70.0) and Emphysema (ICD10-J43.9).

## 2018-02-20 ENCOUNTER — Emergency Department: Payer: Medicare Other

## 2018-02-20 ENCOUNTER — Other Ambulatory Visit: Payer: Self-pay

## 2018-02-20 ENCOUNTER — Emergency Department
Admission: EM | Admit: 2018-02-20 | Discharge: 2018-02-21 | Disposition: A | Discharge status: Home / Self Care | Payer: Medicare Other | Attending: Emergency Medicine | Admitting: Emergency Medicine

## 2018-02-20 DIAGNOSIS — Z8673 Personal history of transient ischemic attack (TIA), and cerebral infarction without residual deficits: Secondary | ICD-10-CM | POA: Diagnosis not present

## 2018-02-20 DIAGNOSIS — F17210 Nicotine dependence, cigarettes, uncomplicated: Secondary | ICD-10-CM | POA: Diagnosis not present

## 2018-02-20 DIAGNOSIS — S93401A Sprain of unspecified ligament of right ankle, initial encounter: Secondary | ICD-10-CM

## 2018-02-20 DIAGNOSIS — Z7982 Long term (current) use of aspirin: Secondary | ICD-10-CM | POA: Insufficient documentation

## 2018-02-20 DIAGNOSIS — E119 Type 2 diabetes mellitus without complications: Secondary | ICD-10-CM | POA: Insufficient documentation

## 2018-02-20 DIAGNOSIS — W1842XA Slipping, tripping and stumbling without falling due to stepping into hole or opening, initial encounter: Secondary | ICD-10-CM | POA: Insufficient documentation

## 2018-02-20 DIAGNOSIS — Z7984 Long term (current) use of oral hypoglycemic drugs: Secondary | ICD-10-CM | POA: Insufficient documentation

## 2018-02-20 DIAGNOSIS — I10 Essential (primary) hypertension: Secondary | ICD-10-CM | POA: Insufficient documentation

## 2018-02-20 DIAGNOSIS — M25471 Effusion, right ankle: Secondary | ICD-10-CM

## 2018-02-20 DIAGNOSIS — Y92007 Garden or yard of unspecified non-institutional (private) residence as the place of occurrence of the external cause: Secondary | ICD-10-CM | POA: Insufficient documentation

## 2018-02-20 DIAGNOSIS — Y9389 Activity, other specified: Secondary | ICD-10-CM | POA: Insufficient documentation

## 2018-02-20 DIAGNOSIS — Y999 Unspecified external cause status: Secondary | ICD-10-CM | POA: Diagnosis not present

## 2018-02-20 DIAGNOSIS — J449 Chronic obstructive pulmonary disease, unspecified: Secondary | ICD-10-CM | POA: Diagnosis not present

## 2018-02-20 DIAGNOSIS — Z79899 Other long term (current) drug therapy: Secondary | ICD-10-CM | POA: Insufficient documentation

## 2018-02-20 DIAGNOSIS — M25571 Pain in right ankle and joints of right foot: Secondary | ICD-10-CM

## 2018-02-20 DIAGNOSIS — S99922A Unspecified injury of left foot, initial encounter: Secondary | ICD-10-CM | POA: Diagnosis present

## 2018-02-20 MED ORDER — NAPROXEN 375 MG PO TABS: 375.0000 mg | ORAL_TABLET | Freq: Two times a day (BID) | ORAL | 0 refills | Status: DC

## 2018-02-20 MED ORDER — KETOROLAC TROMETHAMINE 30 MG/ML IJ SOLN: 30.0000 mg | Freq: Once | INTRAMUSCULAR | Status: DC

## 2018-02-20 MED ORDER — KETOROLAC TROMETHAMINE 30 MG/ML IJ SOLN
30.0000 mg | Freq: Once | INTRAMUSCULAR | Status: AC
  Administered 2018-02-20: 30 mg via INTRAMUSCULAR

## 2018-02-20 NOTE — Discharge Instructions (Signed)
You have been diagnosed with acute pain and swelling of the right ankle.  Your x-ray was negative for fracture.  This is likely a sprain.  We have provided you with an Ace wrap and a walker to aid in ambulation.  You can apply ice for 10 minutes twice a day to help with pain and inflammation.  I have provided you with a prescription for Naproxen to take twice a day for the next week.  Please take with food and avoid all other over-the-counter NSAIDs.

## 2018-02-20 NOTE — ED Triage Notes (Signed)
Pt stepped in hole today and is now having right foot pain.

## 2018-02-20 NOTE — ED Provider Notes (Signed)
[facility] Emergency Department Provider Note ____________________________________________  Time seen: 2310  I have reviewed the triage vital signs and the nursing notes.  HISTORY  Chief Complaint  Foot Pain   HPI Brenda Beasley is a 67 y.o. female presents to the ER today with complaint of right foot pain swelling.  She reports she was walking out in her yard and she stepped in a hole.  Her foot turned underneath her but she did not fall.  She describes the pain as throbbing, sharp and stabbing.  The pain is worse with ambulation but she is able to ambulate.  She denies numbness or tingling in the right foot.  She did not take any meds prior to arrival.  Past Medical History:  Diagnosis Date  . COPD (chronic obstructive pulmonary disease) ([place])   . Diabetes mellitus without complication ([place])   . GERD (gastroesophageal reflux disease)   . Hyperlipidemia   . Hypertension   . IBS (irritable bowel syndrome)   . Personal history of tobacco use, presenting hazards to health 09/20/2015  . Stroke ([place])   . Tobacco abuse 10/04/2014  . Vitamin B 12 deficiency     Patient Active Problem List   Diagnosis Date Noted  . B12 deficiency 04/14/2017  . Drug-induced urticaria 04/14/2017  . GERD (gastroesophageal reflux disease) 04/14/2017  . Hyperlipidemia, mixed 04/14/2017  . Microscopic hematuria 04/14/2017  . Uncontrolled type 2 diabetes mellitus with complication, without long-term current use of insulin ([place]) 04/14/2017  . Vitamin D deficiency, unspecified 04/14/2017  . Colitis 04/02/2017  . Age-related osteoporosis without current pathological fracture 11/13/2016  . Thoracic aortic atherosclerosis ([place]) 01/07/2016  . Facet arthritis of lumbar region 12/19/2015  . Personal history of tobacco use, presenting hazards to health 09/20/2015  . COPD with emphysema ([place]) 09/03/2015  . Essential hypertension 09/03/2015  . Proteinuria 03/05/2015  . Tobacco abuse  10/04/2014  . DDD (degenerative disc disease), lumbar 04/05/2014  . Greater trochanteric bursitis of both hips 04/05/2014  . Lumbar radiculopathy 04/05/2014    Past Surgical History:  Procedure Laterality Date  . PARTIAL COLECTOMY    . TONSILLECTOMY    . VENTRAL HERNIA REPAIR      Prior to Admission medications   Medication Sig Start Date End Date Taking? Authorizing Provider  albuterol (PROAIR HFA) 108 (90 Base) MCG/ACT inhaler Inhale 2 puffs into the lungs every 6 (six) hours as needed. 03/05/15   [provider]  amLODipine (NORVASC) 5 MG tablet Take 2.5 mg by mouth daily.  03/05/15   [provider]  Celedonio Miyamoto 62.5-25 MCG/INH AEPB Take 1 Inhaler by mouth daily. 09/08/17   [provider]  aspirin EC 81 MG tablet Take 81 mg by mouth daily.    [provider]  CALCIUM PO Take 1 tablet by mouth every evening.    [provider]  Cholecalciferol (VITAMIN D3) 5000 units TABS Take 5,000 Units by mouth daily.     [provider]  cyanocobalamin (,VITAMIN B-12,) 1000 MCG/ML injection Inject 1,000 mcg into the muscle every 30 (thirty) days. 10/23/14   [provider]  ferrous sulfate 325 (65 FE) MG tablet Take 325 mg by mouth daily. 03/15/15   [provider]  fluticasone (FLONASE) 50 MCG/ACT nasal spray Place 2 sprays into the nose daily. 03/05/15   [provider]  glipiZIDE (GLUCOTROL) 10 MG tablet Take 20 mg 2 (two) times daily by mouth.  03/15/15   [provider]  hydrochlorothiazide (HYDRODIURIL)  25 MG tablet Take 12.5 mg by mouth daily.  03/05/15   [provider]  HYDROcodone-homatropine (HYCODAN) 5-1.5 MG/5ML syrup Take 5 mLs by mouth every 6 (six) hours as needed for cough. 10/17/17   Hinda Kehr, MD  ipratropium-albuterol (DUONEB) 0.5-2.5 (3) MG/3ML SOLN Take 3 mLs 4 (four) times daily as needed by nebulization.    [provider]  linagliptin (TRADJENTA) 5 MG TABS tablet  Take 5 mg by mouth daily.  02/05/17   [provider]  lisinopril (PRINIVIL,ZESTRIL) 40 MG tablet Take 20 mg by mouth daily.  03/05/15   [provider]  loperamide (IMODIUM) 2 MG capsule Take 1 capsule (2 mg total) every 6 (six) hours as needed by mouth for diarrhea or loose stools. 04/05/17   Dustin Flock, MD  lovastatin (MEVACOR) 40 MG tablet Take 2 tablets by mouth daily. 03/05/15   [provider]  MAGNESIUM PO Take 1 tablet by mouth at bedtime.     [provider]  metFORMIN (GLUCOPHAGE) 1000 MG tablet Take 1,000 mg by mouth 2 (two) times daily. 03/05/15   [provider]  naproxen (NAPROSYN) 375 MG tablet Take 1 tablet (375 mg total) by mouth 2 (two) times daily with a meal. 02/20/18   Zeanna Sunde, Coralie Keens, NP  omeprazole (PRILOSEC) 20 MG capsule Take 20 mg by mouth 2 (two) times daily. 03/05/15   [provider]    Allergies Molds & smuts; Other; Penicillins; Pioglitazone; Byetta 10 mcg pen [exenatide]; Dapagliflozin; Victoza [liraglutide]; and Sulfa antibiotics  Family History  Problem Relation Age of Onset  . Diabetes Mother   . Diabetes Father   . Diabetes Sister   . Diabetes Maternal Grandmother   . Diabetes Paternal Grandmother     Social History Social History   Tobacco Use  . Smoking status: Current Every Day Smoker    Packs/day: 0.50    Years: 52.00    Pack years: 26.00    Types: Cigarettes  . Smokeless tobacco: Never Used  Substance Use Topics  . Alcohol use: No    Alcohol/week: 0.0 standard drinks  . Drug use: Not on file    Review of Systems  Constitutional: Negative for fever. Musculoskeletal: Positive for right ankle pain and swelling. Skin: Negative for redness, warmth or bruising.. Neurological: Negative for focal weakness, tingling or numbness. ____________________________________________  PHYSICAL EXAM:  VITAL SIGNS: ED Triage Vitals  Enc Vitals Group     BP 02/20/18 2248 (!) 173/52      Pulse Rate 02/20/18 2248 83     Resp 02/20/18 2248 20     Temp 02/20/18 2248 (!) 97.5 F (36.4 C)     Temp Source 02/20/18 2248 Oral     SpO2 02/20/18 2248 100 %     Weight 02/20/18 2247 164 lb (74.4 kg)     Height 02/20/18 2247 4\' 11"  (1.499 m)     Head Circumference --      Peak Flow --      Pain Score 02/20/18 2246 5     Pain Loc --      Pain Edu? --      Excl. in [place]? --     Constitutional: Alert and oriented. Well appearing and in no distress. Cardiovascular: Pedal pulse 2+ bilaterally. Musculoskeletal: Normal flexion, extension and rotation of the right ankle.  2+ swelling noted over the lateral malleolus.  Pain with palpation of the lateral malleolus.  Pain with palpation over the distal fourth metatarsal. Neurologic: Temps with  normal gait.  Sensation intact to bilateral lower extremities. Skin:  Skin is warm, dry and intact. No bruising noted.  ____________________________________________   RADIOLOGY . Imaging Orders     DG Foot Complete Right IMPRESSION: Dorsal soft tissue edema without fracture or dislocation. ____________________________________________   INITIAL IMPRESSION / ASSESSMENT AND PLAN / ED COURSE  Right Ankle Pain and Swelling, Right Ankle Sprain:  DDX include right ankle fracture, right ankle pain Xray negative for acute fracture Toradol 30 mg IM now ACE wrap applied Walker given Can use ice for 10 min 2 x day for pain and swelling RX for Naproxen 375 mg BID x 1 week ____________________________________________  FINAL CLINICAL IMPRESSION(S) / ED DIAGNOSES  Final diagnoses:  Pain and swelling of right ankle  Sprain of right ankle, unspecified ligament, initial encounter      Jearld Fenton, NP 02/20/18 2337    Nena Polio, MD 02/21/18 [phone]

## 2018-02-21 MED ORDER — KETOROLAC TROMETHAMINE 30 MG/ML IJ SOLN
INTRAMUSCULAR | Status: AC
  Filled 2018-02-21: qty 1

## 2018-04-02 ENCOUNTER — Telehealth: Payer: Self-pay

## 2018-04-02 NOTE — Telephone Encounter (Signed)
Call pt regarding lung screening. Pt is a current smoker. Pt smokes about 1/2 pack a day.  Pt would like any afternoon except a Friday. Pt denies any new health issues.

## 2018-04-04 ENCOUNTER — Telehealth: Payer: Self-pay | Admitting: *Deleted

## 2018-04-04 DIAGNOSIS — Z122 Encounter for screening for malignant neoplasm of respiratory organs: Secondary | ICD-10-CM

## 2018-04-04 DIAGNOSIS — R918 Other nonspecific abnormal finding of lung field: Secondary | ICD-10-CM

## 2018-04-04 DIAGNOSIS — Z87891 Personal history of nicotine dependence: Secondary | ICD-10-CM

## 2018-04-04 NOTE — Telephone Encounter (Signed)
error 

## 2018-04-04 NOTE — Telephone Encounter (Signed)
Contacted regarding scheduling LCS nodule follow up scan. Patient is agreeable to scheduling.

## 2018-04-22 ENCOUNTER — Ambulatory Visit
Admission: RE | Admit: 2018-04-22 | Discharge: 2018-04-22 | Disposition: A | Discharge status: Home / Self Care | Payer: Medicare Other | Source: Ambulatory Visit | Attending: Oncology | Admitting: Oncology

## 2018-04-22 DIAGNOSIS — Z87891 Personal history of nicotine dependence: Secondary | ICD-10-CM | POA: Diagnosis present

## 2018-04-22 DIAGNOSIS — Z122 Encounter for screening for malignant neoplasm of respiratory organs: Secondary | ICD-10-CM | POA: Diagnosis not present

## 2018-04-22 DIAGNOSIS — R918 Other nonspecific abnormal finding of lung field: Secondary | ICD-10-CM | POA: Diagnosis present

## 2018-04-25 ENCOUNTER — Telehealth: Payer: Self-pay | Admitting: *Deleted

## 2018-04-25 NOTE — Telephone Encounter (Signed)
Notified patient of LDCT lung cancer screening program results with recommendation for 6 month follow up imaging. Also notified of incidental findings noted below and is encouraged to discuss further with PCP who will receive a copy of this note and/or the CT report. Patient verbalizes understanding.   IMPRESSION: 1. Lung-RADS 3, probably benign findings. Short-term follow-up in 6 months is recommended with repeat low-dose chest CT without contrast (please use the following order, "CT CHEST LCS NODULE FOLLOW-UP W/O CM"). Although the worrisome nodules on the prior exam are similar and resolved, there has been interval development of bilateral new pulmonary nodules, as detailed above. 2. Focal dilatation descending thoracic aorta is similar and warrants followup attention. 3. Aortic atherosclerosis (ICD10-I70.0), coronary artery atherosclerosis and emphysema (ICD10-J43.9). 4. Hepatic morphology for which mild cirrhosis cannot be excluded. Correlate with risk factors.

## 2018-06-23 ENCOUNTER — Encounter: Payer: Self-pay | Admitting: Emergency Medicine

## 2018-06-23 ENCOUNTER — Emergency Department
Admission: EM | Admit: 2018-06-23 | Discharge: 2018-06-24 | Disposition: A | Discharge status: Home / Self Care | Payer: Medicare Other | Attending: Emergency Medicine | Admitting: Emergency Medicine

## 2018-06-23 DIAGNOSIS — R109 Unspecified abdominal pain: Secondary | ICD-10-CM | POA: Insufficient documentation

## 2018-06-23 DIAGNOSIS — F17210 Nicotine dependence, cigarettes, uncomplicated: Secondary | ICD-10-CM | POA: Diagnosis not present

## 2018-06-23 DIAGNOSIS — Z7982 Long term (current) use of aspirin: Secondary | ICD-10-CM | POA: Diagnosis not present

## 2018-06-23 DIAGNOSIS — R519 Headache, unspecified: Secondary | ICD-10-CM

## 2018-06-23 DIAGNOSIS — J449 Chronic obstructive pulmonary disease, unspecified: Secondary | ICD-10-CM | POA: Insufficient documentation

## 2018-06-23 DIAGNOSIS — Z7984 Long term (current) use of oral hypoglycemic drugs: Secondary | ICD-10-CM | POA: Diagnosis not present

## 2018-06-23 DIAGNOSIS — E119 Type 2 diabetes mellitus without complications: Secondary | ICD-10-CM | POA: Insufficient documentation

## 2018-06-23 DIAGNOSIS — R51 Headache: Secondary | ICD-10-CM | POA: Diagnosis not present

## 2018-06-23 DIAGNOSIS — Z79899 Other long term (current) drug therapy: Secondary | ICD-10-CM | POA: Diagnosis not present

## 2018-06-23 DIAGNOSIS — Z8673 Personal history of transient ischemic attack (TIA), and cerebral infarction without residual deficits: Secondary | ICD-10-CM | POA: Diagnosis not present

## 2018-06-23 DIAGNOSIS — J45909 Unspecified asthma, uncomplicated: Secondary | ICD-10-CM | POA: Diagnosis not present

## 2018-06-23 DIAGNOSIS — R110 Nausea: Secondary | ICD-10-CM

## 2018-06-23 DIAGNOSIS — I10 Essential (primary) hypertension: Secondary | ICD-10-CM | POA: Insufficient documentation

## 2018-06-23 HISTORY — DX: Unspecified asthma, uncomplicated: J45.909

## 2018-06-23 MED ORDER — MORPHINE SULFATE (PF) 2 MG/ML IV SOLN
2.0000 mg | Freq: Once | INTRAVENOUS | Status: AC
  Administered 2018-06-24: 2 mg via INTRAVENOUS
  Filled 2018-06-23: qty 1

## 2018-06-23 MED ORDER — SODIUM CHLORIDE 0.9 % IV BOLUS
1000.0000 mL | Freq: Once | INTRAVENOUS | Status: AC
  Administered 2018-06-24: 1000 mL via INTRAVENOUS

## 2018-06-23 MED ORDER — ONDANSETRON 4 MG PO TBDP
ORAL_TABLET | ORAL | Status: AC
  Filled 2018-06-23: qty 1

## 2018-06-23 MED ORDER — ONDANSETRON 4 MG PO TBDP
4.0000 mg | ORAL_TABLET | Freq: Once | ORAL | Status: AC
  Administered 2018-06-23: 4 mg via ORAL

## 2018-06-23 NOTE — ED Triage Notes (Signed)
Pt c/o constant nausea and HA since this AM. Pt reports that she is "cold." No fever in triage. Pt denies abdominal pain.

## 2018-06-23 NOTE — ED Provider Notes (Signed)
[facility] Emergency Department Provider Note   First MD Initiated Contact with Patient 06/23/18 2306     (approximate)  I have reviewed the triage vital signs and the nursing notes.   HISTORY  Chief Complaint Headache and Nausea   HPI Brenda Beasley is a 68 y.o. female with below list of chronic medical conditions presents to the emergency department acute onset of nausea occipital headache feeling "cold" and abdominal discomfort which began this morning.  Patient denies any fever.  Patient denies any vomiting however does admit to persistent nausea.  Patient denies any urinary symptoms.  Patient denies any weakness numbness gait instability or visual changes.  Past Medical History:  Diagnosis Date  . Asthma   . COPD (chronic obstructive pulmonary disease) ([place])   . Diabetes mellitus without complication ([place])   . GERD (gastroesophageal reflux disease)   . Hyperlipidemia   . Hypertension   . IBS (irritable bowel syndrome)   . Personal history of tobacco use, presenting hazards to health 09/20/2015  . Stroke ([place])   . Tobacco abuse 10/04/2014  . Vitamin B 12 deficiency     Patient Active Problem List   Diagnosis Date Noted  . B12 deficiency 04/14/2017  . Drug-induced urticaria 04/14/2017  . GERD (gastroesophageal reflux disease) 04/14/2017  . Hyperlipidemia, mixed 04/14/2017  . Microscopic hematuria 04/14/2017  . Uncontrolled type 2 diabetes mellitus with complication, without long-term current use of insulin ([place]) 04/14/2017  . Vitamin D deficiency, unspecified 04/14/2017  . Colitis 04/02/2017  . Age-related osteoporosis without current pathological fracture 11/13/2016  . Thoracic aortic atherosclerosis ([place]) 01/07/2016  . Facet arthritis of lumbar region 12/19/2015  . Personal history of tobacco use, presenting hazards to health 09/20/2015  . COPD with emphysema ([place]) 09/03/2015  . Essential hypertension 09/03/2015  . Proteinuria 03/05/2015    . Tobacco abuse 10/04/2014  . DDD (degenerative disc disease), lumbar 04/05/2014  . Greater trochanteric bursitis of both hips 04/05/2014  . Lumbar radiculopathy 04/05/2014    Past Surgical History:  Procedure Laterality Date  . PARTIAL COLECTOMY    . TONSILLECTOMY    . VENTRAL HERNIA REPAIR      Prior to Admission medications   Medication Sig Start Date End Date Taking? Authorizing Provider  albuterol (PROAIR HFA) 108 (90 Base) MCG/ACT inhaler Inhale 2 puffs into the lungs every 6 (six) hours as needed. 03/05/15   [provider]  amLODipine (NORVASC) 5 MG tablet Take 2.5 mg by mouth daily.  03/05/15   [provider]  Celedonio Miyamoto 62.5-25 MCG/INH AEPB Take 1 Inhaler by mouth daily. 09/08/17   [provider]  aspirin EC 81 MG tablet Take 81 mg by mouth daily.    [provider]  CALCIUM PO Take 1 tablet by mouth every evening.    [provider]  Cholecalciferol (VITAMIN D3) 5000 units TABS Take 5,000 Units by mouth daily.     [provider]  cyanocobalamin (,VITAMIN B-12,) 1000 MCG/ML injection Inject 1,000 mcg into the muscle every 30 (thirty) days. 10/23/14   [provider]  ferrous sulfate 325 (65 FE) MG tablet Take 325 mg by mouth daily. 03/15/15   [provider]  fluticasone (FLONASE) 50 MCG/ACT nasal spray Place 2 sprays into the nose daily. 03/05/15   [provider]  glipiZIDE (GLUCOTROL) 10 MG tablet Take 20 mg 2 (two) times daily by mouth.  03/15/15   [provider]  hydrochlorothiazide (HYDRODIURIL) 25 MG tablet Take 12.5  mg by mouth daily.  03/05/15   [provider]  HYDROcodone-homatropine (HYCODAN) 5-1.5 MG/5ML syrup Take 5 mLs by mouth every 6 (six) hours as needed for cough. 10/17/17   Hinda Kehr, MD  ipratropium-albuterol (DUONEB) 0.5-2.5 (3) MG/3ML SOLN Take 3 mLs 4 (four) times daily as needed by nebulization.    [provider]  linagliptin (TRADJENTA) 5  MG TABS tablet Take 5 mg by mouth daily.  02/05/17   [provider]  lisinopril (PRINIVIL,ZESTRIL) 40 MG tablet Take 20 mg by mouth daily.  03/05/15   [provider]  loperamide (IMODIUM) 2 MG capsule Take 1 capsule (2 mg total) every 6 (six) hours as needed by mouth for diarrhea or loose stools. 04/05/17   Dustin Flock, MD  lovastatin (MEVACOR) 40 MG tablet Take 2 tablets by mouth daily. 03/05/15   [provider]  MAGNESIUM PO Take 1 tablet by mouth at bedtime.     [provider]  metFORMIN (GLUCOPHAGE) 1000 MG tablet Take 1,000 mg by mouth 2 (two) times daily. 03/05/15   [provider]  naproxen (NAPROSYN) 375 MG tablet Take 1 tablet (375 mg total) by mouth 2 (two) times daily with a meal. 02/20/18   Baity, Coralie Keens, NP  omeprazole (PRILOSEC) 20 MG capsule Take 20 mg by mouth 2 (two) times daily. 03/05/15   [provider]  ondansetron (ZOFRAN ODT) 4 MG disintegrating tablet Take 1 tablet (4 mg total) by mouth every 8 (eight) hours as needed. 06/24/18   Gregor Hams, MD    Allergies Molds & smuts; Other; Penicillins; Pioglitazone; Byetta 10 mcg pen [exenatide]; Dapagliflozin; Victoza [liraglutide]; and Sulfa antibiotics  Family History  Problem Relation Age of Onset  . Diabetes Mother   . Diabetes Father   . Diabetes Sister   . Diabetes Maternal Grandmother   . Diabetes Paternal Grandmother     Social History Social History   Tobacco Use  . Smoking status: Current Every Day Smoker    Packs/day: 0.50    Years: 52.00    Pack years: 26.00    Types: Cigarettes  . Smokeless tobacco: Never Used  Substance Use Topics  . Alcohol use: No    Alcohol/week: 0.0 standard drinks  . Drug use: Not on file    Review of Systems Constitutional: No fever/chills Eyes: No visual changes. ENT: No sore throat. Cardiovascular: Denies chest pain. Respiratory: Denies shortness of breath. Gastrointestinal: Positive for abdominal pain  and nausea Genitourinary: Negative for dysuria. Musculoskeletal: Negative for neck pain.  Negative for back pain. Integumentary: Negative for rash. Neurological: Positive for headaches, negative for focal weakness or numbness.  ____________________________________________   PHYSICAL EXAM:  VITAL SIGNS: ED Triage Vitals  Enc Vitals Group     BP 06/23/18 1929 (!) 157/60     Pulse Rate 06/23/18 1929 86     Resp 06/23/18 1929 18     Temp 06/23/18 1929 98.4 F (36.9 C)     Temp Source 06/23/18 1929 Oral     SpO2 06/23/18 1929 93 %     Weight 06/23/18 1930 77.1 kg (170 lb)     Height 06/23/18 1930 1.499 m (4\' 11" )     Head Circumference --      Peak Flow --      Pain Score --      Pain Loc --      Pain Edu? --      Excl. in [place]? --     Constitutional: Alert  and oriented. Well appearing and in no acute distress. Eyes: Conjunctivae are normal. PERRL. EOMI. Head: Atraumatic. Mouth/Throat: Mucous membranes are moist.  Oropharynx non-erythematous. Neck: No stridor.  No meningeal signs.  Cardiovascular: Normal rate, regular rhythm. Good peripheral circulation. Grossly normal heart sounds. Respiratory: Normal respiratory effort.  No retractions. Lungs CTAB. Gastrointestinal: Soft and nontender. No distention.  Musculoskeletal: No lower extremity tenderness nor edema. No gross deformities of extremities. Neurologic:  Normal speech and language. No gross focal neurologic deficits are appreciated.  Skin:  Skin is warm, dry and intact. No rash noted. Psychiatric: Mood and affect are normal. Speech and behavior are normal.  ____________________________________________   LABS (all labs ordered are listed, but only abnormal results are displayed)  Labs Reviewed  CBC - Abnormal; Notable for the following components:      Result Value   WBC 11.3 (*)    Hemoglobin 11.5 (*)    MCH 25.7 (*)    All other components within normal limits  COMPREHENSIVE METABOLIC PANEL - Abnormal; Notable  for the following components:   Glucose, Bld 160 (*)    All other components within normal limits  URINALYSIS, COMPLETE (UACMP) WITH MICROSCOPIC - Abnormal; Notable for the following components:   Color, Urine YELLOW (*)    APPearance HAZY (*)    Glucose, UA >=500 (*)    Ketones, ur 5 (*)    Protein, ur 30 (*)    All other components within normal limits  LIPASE, BLOOD   ____________________________________________  EKG  ED ECG REPORT I, [place] N Idonna Heeren, the attending physician, personally viewed and interpreted this ECG.   Date: 06/24/2018  EKG Time: 00:21AM  Rate: 72  Rhythm: Normal Sinus Rhythm  Axis: Normal  Intervals:Normal  ST&T Change: None  ____________________________________________  RADIOLOGY I, Rodney N Rebeka Kimble, personally viewed and evaluated these images (plain radiographs) as part of my medical decision making, as well as reviewing the written report by the radiologist.  ED MD interpretation: CT abdomen pelvis revealed no acute process.  CT head likewise revealed no acute intracranial process per radiologist.  Official radiology report(s): Ct Head Wo Contrast  Result Date: 06/24/2018 CLINICAL DATA:  Constant nausea and headaches since this morning. EXAM: CT HEAD WITHOUT CONTRAST TECHNIQUE: Contiguous axial images were obtained from the base of the skull through the vertex without intravenous contrast. COMPARISON:  10/05/2009 FINDINGS: Brain: No evidence of acute infarction, hemorrhage, hydrocephalus, extra-axial collection or mass lesion/mass effect. Mild cerebral atrophy. Ventricular dilatation consistent with central atrophy. Patchy low-attenuation changes in the deep white matter consistent with small vessel ischemia. Vascular: Moderate intracranial arterial calcifications are present. Skull: Calvarium appears intact. No acute depressed skull fractures. Sinuses/Orbits: Paranasal sinuses and mastoid air cells are clear. Other: None. IMPRESSION: No acute  intracranial abnormalities. Chronic atrophy and small vessel ischemic changes. Electronically Signed   By: Lucienne Capers M.D.   On: 06/24/2018 01:46   Ct Abdomen Pelvis W Contrast  Result Date: 06/24/2018 CLINICAL DATA:  Nausea and headache since this morning. Patient is cold. EXAM: CT ABDOMEN AND PELVIS WITH CONTRAST TECHNIQUE: Multidetector CT imaging of the abdomen and pelvis was performed using the standard protocol following bolus administration of intravenous contrast. CONTRAST:  146mL ISOVUE-300 IOPAMIDOL (ISOVUE-300) INJECTION 61% COMPARISON:  04/02/2017 FINDINGS: Lower chest: Mild dependent changes in the lung bases. Small esophageal hiatal hernia. Coronary artery calcifications. Hepatobiliary: Mild diffuse fatty infiltration of the liver. No focal lesions. Enlarged lateral segment of left lobe and enlarged caudate lobe with mild nodularity of  hepatic contour suggests cirrhosis. Gallbladder and bile ducts are unremarkable. Pancreas: Unremarkable. No pancreatic ductal dilatation or surrounding inflammatory changes. Spleen: Normal in size without focal abnormality. Adrenals/Urinary Tract: No adrenal gland nodules. Benign-appearing cysts in the right kidney. Nephrograms are homogeneous and symmetrical. No hydronephrosis or hydroureter. Bladder is unremarkable. Stomach/Bowel: Stomach, small bowel, and colon are not abnormally distended. Scattered stool throughout the colon. Diverticulosis of the sigmoid colon without evidence of diverticulitis. Partial right hemicolectomy with ileal colonic anastomosis in the mid transverse region. Vascular/Lymphatic: Aortic atherosclerosis. No enlarged abdominal or pelvic lymph nodes. Reproductive: Uterus and bilateral adnexa are unremarkable. Other: No abdominal wall hernia or abnormality. No abdominopelvic ascites. Musculoskeletal: Degenerative changes in the spine. No destructive bone lesions. IMPRESSION: No acute process demonstrated in the abdomen or pelvis. No  evidence of bowel obstruction or inflammation. Diffuse fatty infiltration of the liver. Suggestion of hepatic cirrhosis. Small esophageal hiatal hernia. Electronically Signed   By: Lucienne Capers M.D.   On: 06/24/2018 01:50    ____________________ Procedures   ____________________________________________   INITIAL IMPRESSION / ASSESSMENT AND PLAN / ED COURSE  As part of my medical decision making, I reviewed the following data within the electronic MEDICAL RECORD NUMBER   68 year old female presented with above-stated history and physical exam secondary to headache and nausea.  Patient had abdominal discomfort with palpation and as such CT abdomen was performed to evaluate for possible diverticulitis versus potential other intra-abdominal pathology.  CT abdomen pelvis was normal per radiologist.  Likewise CT head was performed given headache with nausea and concern for possible intracranial abnormality.  CT head was normal as well.  Patient was given 1 L IV normal saline and Zofran and on reevaluation patient states that symptoms have resolved.  Patient advised to follow-up with Dr. Kerrin Mo primary care provider.  I also informed the patient of her proteinuria and advised her to notify Dr.Klein of the same. ____________________________________________  FINAL CLINICAL IMPRESSION(S) / ED DIAGNOSES  Final diagnoses:  Acute nonintractable headache, unspecified headache type  Nausea     MEDICATIONS GIVEN DURING THIS VISIT:  Medications  ondansetron (ZOFRAN-ODT) 4 MG disintegrating tablet (has no administration in time range)  ondansetron (ZOFRAN-ODT) disintegrating tablet 4 mg (4 mg Oral Given 06/23/18 1947)  sodium chloride 0.9 % bolus 1,000 mL (1,000 mLs Intravenous New Bag/Given 06/24/18 0024)  morphine 2 MG/ML injection 2 mg (2 mg Intravenous Given 06/24/18 0024)  iopamidol (ISOVUE-300) 61 % injection 100 mL (100 mLs Intravenous Contrast Given 06/24/18 0110)     ED Discharge Orders          Ordered    ondansetron (ZOFRAN ODT) 4 MG disintegrating tablet  Every 8 hours PRN     06/24/18 0349           Note:  This document was prepared using Dragon voice recognition software and may include unintentional dictation errors.   Gregor Hams, MD 06/24/18 [phone]

## 2018-06-24 ENCOUNTER — Emergency Department: Payer: Medicare Other

## 2018-06-24 DIAGNOSIS — R51 Headache: Secondary | ICD-10-CM | POA: Diagnosis not present

## 2018-06-24 LAB — CBC
HEMATOCRIT: 36.6 % (ref 36.0–46.0)
Hemoglobin: 11.5 g/dL — ABNORMAL LOW (ref 12.0–15.0)
MCH: 25.7 pg — ABNORMAL LOW (ref 26.0–34.0)
MCHC: 31.4 g/dL (ref 30.0–36.0)
MCV: 81.9 fL (ref 80.0–100.0)
Platelets: 209 10*3/uL (ref 150–400)
RBC: 4.47 MIL/uL (ref 3.87–5.11)
RDW: 14.8 % (ref 11.5–15.5)
WBC: 11.3 10*3/uL — ABNORMAL HIGH (ref 4.0–10.5)
nRBC: 0 % (ref 0.0–0.2)

## 2018-06-24 LAB — COMPREHENSIVE METABOLIC PANEL
ALBUMIN: 4.1 g/dL (ref 3.5–5.0)
ALT: 23 U/L (ref 0–44)
AST: 30 U/L (ref 15–41)
Alkaline Phosphatase: 61 U/L (ref 38–126)
Anion gap: 11 (ref 5–15)
BILIRUBIN TOTAL: 0.8 mg/dL (ref 0.3–1.2)
BUN: 20 mg/dL (ref 8–23)
CALCIUM: 9.3 mg/dL (ref 8.9–10.3)
CO2: 27 mmol/L (ref 22–32)
Chloride: 98 mmol/L (ref 98–111)
Creatinine, Ser: 0.97 mg/dL (ref 0.44–1.00)
GFR calc Af Amer: >60 mL/min (ref >60)
GFR calc non Af Amer: >60 mL/min (ref >60)
Glucose, Bld: 160 mg/dL — ABNORMAL HIGH (ref 70–99)
Potassium: 4.2 mmol/L (ref 3.5–5.1)
Sodium: 136 mmol/L (ref 135–145)
Total Protein: 7.1 g/dL (ref 6.5–8.1)

## 2018-06-24 LAB — URINALYSIS, COMPLETE (UACMP) WITH MICROSCOPIC
BACTERIA UA: NONE SEEN
BILIRUBIN URINE: NEGATIVE
Glucose, UA: >=500 mg/dL — AB
Hgb urine dipstick: NEGATIVE
Ketones, ur: 5 mg/dL — AB
Leukocytes, UA: NEGATIVE
Nitrite: NEGATIVE
Protein, ur: 30 mg/dL — AB
SPECIFIC GRAVITY, URINE: 1.021 (ref 1.005–1.030)
pH: 5 (ref 5.0–8.0)

## 2018-06-24 LAB — LIPASE, BLOOD: Lipase: 27 U/L (ref 11–51)

## 2018-06-24 MED ORDER — ONDANSETRON 4 MG PO TBDP: 4.0000 mg | ORAL_TABLET | Freq: Three times a day (TID) | ORAL | 0 refills | Status: DC | PRN

## 2018-06-24 MED ORDER — IOPAMIDOL (ISOVUE-300) INJECTION 61%
100.0000 mL | Freq: Once | INTRAVENOUS | Status: AC | PRN
  Administered 2018-06-24: 100 mL via INTRAVENOUS

## 2018-10-14 ENCOUNTER — Telehealth: Payer: Self-pay | Admitting: *Deleted

## 2018-10-14 DIAGNOSIS — R918 Other nonspecific abnormal finding of lung field: Secondary | ICD-10-CM

## 2018-10-14 DIAGNOSIS — Z122 Encounter for screening for malignant neoplasm of respiratory organs: Secondary | ICD-10-CM

## 2018-10-14 DIAGNOSIS — Z87891 Personal history of nicotine dependence: Secondary | ICD-10-CM

## 2018-10-14 NOTE — Telephone Encounter (Signed)
Patient notified and scheduled for LCS nodule f/u ct scan.

## 2018-10-24 ENCOUNTER — Ambulatory Visit
Admission: RE | Admit: 2018-10-24 | Discharge: 2018-10-24 | Disposition: A | Discharge status: Home / Self Care | Payer: Medicare Other | Source: Ambulatory Visit | Attending: Oncology | Admitting: Oncology

## 2018-10-24 ENCOUNTER — Other Ambulatory Visit: Payer: Self-pay

## 2018-10-24 DIAGNOSIS — Z122 Encounter for screening for malignant neoplasm of respiratory organs: Secondary | ICD-10-CM | POA: Diagnosis not present

## 2018-10-24 DIAGNOSIS — Z87891 Personal history of nicotine dependence: Secondary | ICD-10-CM | POA: Diagnosis present

## 2018-10-24 DIAGNOSIS — R918 Other nonspecific abnormal finding of lung field: Secondary | ICD-10-CM | POA: Insufficient documentation

## 2018-10-26 ENCOUNTER — Encounter: Payer: Self-pay | Admitting: *Deleted

## 2018-11-03 DIAGNOSIS — M8000XA Age-related osteoporosis with current pathological fracture, unspecified site, initial encounter for fracture: Secondary | ICD-10-CM

## 2018-11-03 HISTORY — DX: Age-related osteoporosis with current pathological fracture, unspecified site, initial encounter for fracture: M80.00XA

## 2018-11-09 ENCOUNTER — Other Ambulatory Visit
Admission: RE | Admit: 2018-11-09 | Discharge: 2018-11-09 | Disposition: A | Discharge status: Home / Self Care | Payer: Medicare Other | Source: Ambulatory Visit | Attending: Student | Admitting: Student

## 2018-11-09 DIAGNOSIS — R197 Diarrhea, unspecified: Secondary | ICD-10-CM | POA: Insufficient documentation

## 2018-11-09 LAB — GASTROINTESTINAL PANEL BY PCR, STOOL (REPLACES STOOL CULTURE)

## 2018-11-09 LAB — C DIFFICILE QUICK SCREEN W PCR REFLEX
C Diff antigen: NEGATIVE
C Diff interpretation: NOT DETECTED
C Diff toxin: NEGATIVE

## 2018-11-14 LAB — CALPROTECTIN, FECAL: Calprotectin, Fecal: 1042 ug/g — ABNORMAL HIGH (ref 0–120)

## 2018-11-15 LAB — PANCREATIC ELASTASE, FECAL: Pancreatic Elastase-1, Stool: 330 ug Elast./g (ref >200)

## 2018-11-17 ENCOUNTER — Other Ambulatory Visit
Admission: RE | Admit: 2018-11-17 | Discharge: 2018-11-17 | Disposition: A | Discharge status: Home / Self Care | Payer: Medicare Other | Source: Ambulatory Visit | Attending: Internal Medicine | Admitting: Internal Medicine

## 2018-11-17 ENCOUNTER — Other Ambulatory Visit: Payer: Self-pay

## 2018-11-17 DIAGNOSIS — Z1159 Encounter for screening for other viral diseases: Secondary | ICD-10-CM | POA: Diagnosis not present

## 2018-11-17 DIAGNOSIS — Z01812 Encounter for preprocedural laboratory examination: Secondary | ICD-10-CM | POA: Diagnosis present

## 2018-11-17 LAB — SARS CORONAVIRUS 2 (TAT 6-24 HRS): SARS Coronavirus 2: NEGATIVE

## 2018-11-22 ENCOUNTER — Encounter: Payer: Self-pay | Admitting: *Deleted

## 2018-11-23 ENCOUNTER — Encounter
Admission: RE | Disposition: A | Discharge status: Home / Self Care | Payer: Self-pay | Source: Home / Self Care | Attending: Internal Medicine

## 2018-11-23 ENCOUNTER — Ambulatory Visit: Payer: Medicare Other | Admitting: Certified Registered"

## 2018-11-23 ENCOUNTER — Ambulatory Visit
Admission: RE | Admit: 2018-11-23 | Discharge: 2018-11-23 | Disposition: A | Discharge status: Home / Self Care | Payer: Medicare Other | Attending: Internal Medicine | Admitting: Internal Medicine

## 2018-11-23 ENCOUNTER — Encounter: Payer: Self-pay | Admitting: *Deleted

## 2018-11-23 ENCOUNTER — Other Ambulatory Visit: Payer: Self-pay

## 2018-11-23 DIAGNOSIS — D500 Iron deficiency anemia secondary to blood loss (chronic): Secondary | ICD-10-CM | POA: Diagnosis not present

## 2018-11-23 DIAGNOSIS — K642 Third degree hemorrhoids: Secondary | ICD-10-CM | POA: Insufficient documentation

## 2018-11-23 DIAGNOSIS — K633 Ulcer of intestine: Secondary | ICD-10-CM | POA: Diagnosis not present

## 2018-11-23 DIAGNOSIS — Z880 Allergy status to penicillin: Secondary | ICD-10-CM | POA: Diagnosis not present

## 2018-11-23 DIAGNOSIS — Z791 Long term (current) use of non-steroidal anti-inflammatories (NSAID): Secondary | ICD-10-CM | POA: Insufficient documentation

## 2018-11-23 DIAGNOSIS — I10 Essential (primary) hypertension: Secondary | ICD-10-CM | POA: Insufficient documentation

## 2018-11-23 DIAGNOSIS — F419 Anxiety disorder, unspecified: Secondary | ICD-10-CM | POA: Diagnosis not present

## 2018-11-23 DIAGNOSIS — M810 Age-related osteoporosis without current pathological fracture: Secondary | ICD-10-CM | POA: Insufficient documentation

## 2018-11-23 DIAGNOSIS — K7460 Unspecified cirrhosis of liver: Secondary | ICD-10-CM | POA: Insufficient documentation

## 2018-11-23 DIAGNOSIS — Z888 Allergy status to other drugs, medicaments and biological substances status: Secondary | ICD-10-CM | POA: Insufficient documentation

## 2018-11-23 DIAGNOSIS — Z87891 Personal history of nicotine dependence: Secondary | ICD-10-CM | POA: Insufficient documentation

## 2018-11-23 DIAGNOSIS — K766 Portal hypertension: Secondary | ICD-10-CM | POA: Diagnosis not present

## 2018-11-23 DIAGNOSIS — K222 Esophageal obstruction: Secondary | ICD-10-CM | POA: Diagnosis not present

## 2018-11-23 DIAGNOSIS — Z7982 Long term (current) use of aspirin: Secondary | ICD-10-CM | POA: Insufficient documentation

## 2018-11-23 DIAGNOSIS — Z79899 Other long term (current) drug therapy: Secondary | ICD-10-CM | POA: Diagnosis not present

## 2018-11-23 DIAGNOSIS — Z794 Long term (current) use of insulin: Secondary | ICD-10-CM | POA: Insufficient documentation

## 2018-11-23 DIAGNOSIS — Z9109 Other allergy status, other than to drugs and biological substances: Secondary | ICD-10-CM | POA: Diagnosis not present

## 2018-11-23 DIAGNOSIS — E559 Vitamin D deficiency, unspecified: Secondary | ICD-10-CM | POA: Insufficient documentation

## 2018-11-23 DIAGNOSIS — K449 Diaphragmatic hernia without obstruction or gangrene: Secondary | ICD-10-CM | POA: Diagnosis not present

## 2018-11-23 DIAGNOSIS — Z91048 Other nonmedicinal substance allergy status: Secondary | ICD-10-CM | POA: Insufficient documentation

## 2018-11-23 DIAGNOSIS — K56699 Other intestinal obstruction unspecified as to partial versus complete obstruction: Secondary | ICD-10-CM | POA: Diagnosis not present

## 2018-11-23 DIAGNOSIS — Z882 Allergy status to sulfonamides status: Secondary | ICD-10-CM | POA: Insufficient documentation

## 2018-11-23 DIAGNOSIS — K5730 Diverticulosis of large intestine without perforation or abscess without bleeding: Secondary | ICD-10-CM | POA: Insufficient documentation

## 2018-11-23 DIAGNOSIS — E538 Deficiency of other specified B group vitamins: Secondary | ICD-10-CM | POA: Insufficient documentation

## 2018-11-23 DIAGNOSIS — K589 Irritable bowel syndrome without diarrhea: Secondary | ICD-10-CM | POA: Diagnosis not present

## 2018-11-23 DIAGNOSIS — E119 Type 2 diabetes mellitus without complications: Secondary | ICD-10-CM | POA: Insufficient documentation

## 2018-11-23 DIAGNOSIS — K3189 Other diseases of stomach and duodenum: Secondary | ICD-10-CM | POA: Insufficient documentation

## 2018-11-23 DIAGNOSIS — E785 Hyperlipidemia, unspecified: Secondary | ICD-10-CM | POA: Insufficient documentation

## 2018-11-23 DIAGNOSIS — K219 Gastro-esophageal reflux disease without esophagitis: Secondary | ICD-10-CM | POA: Diagnosis not present

## 2018-11-23 DIAGNOSIS — Z8673 Personal history of transient ischemic attack (TIA), and cerebral infarction without residual deficits: Secondary | ICD-10-CM | POA: Insufficient documentation

## 2018-11-23 DIAGNOSIS — J449 Chronic obstructive pulmonary disease, unspecified: Secondary | ICD-10-CM | POA: Insufficient documentation

## 2018-11-23 HISTORY — PX: ESOPHAGOGASTRODUODENOSCOPY (EGD) WITH PROPOFOL: SHX5813

## 2018-11-23 HISTORY — PX: COLONOSCOPY WITH PROPOFOL: SHX5780

## 2018-11-23 HISTORY — DX: Unspecified cirrhosis of liver: K74.60

## 2018-11-23 HISTORY — DX: Personal history of other benign neoplasm: Z86.018

## 2018-11-23 HISTORY — DX: Pruritus, unspecified: L29.9

## 2018-11-23 HISTORY — DX: Proteinuria, unspecified: R80.9

## 2018-11-23 HISTORY — DX: Personal history of other diseases of the digestive system: Z87.19

## 2018-11-23 HISTORY — DX: Anxiety disorder, unspecified: F41.9

## 2018-11-23 HISTORY — DX: Other microscopic hematuria: R31.29

## 2018-11-23 HISTORY — DX: Vitamin D deficiency, unspecified: E55.9

## 2018-11-23 LAB — GLUCOSE, CAPILLARY: Glucose-Capillary: 198 mg/dL — ABNORMAL HIGH (ref 70–99)

## 2018-11-23 SURGERY — ESOPHAGOGASTRODUODENOSCOPY (EGD) WITH PROPOFOL
Anesthesia: General

## 2018-11-23 MED ORDER — PROPOFOL 500 MG/50ML IV EMUL
INTRAVENOUS | Status: DC | PRN
  Administered 2018-11-23: 100 ug/kg/min via INTRAVENOUS

## 2018-11-23 MED ORDER — FENTANYL CITRATE (PF) 100 MCG/2ML IJ SOLN
INTRAMUSCULAR | Status: DC | PRN
  Administered 2018-11-23 (×2): 25 ug via INTRAVENOUS
  Administered 2018-11-23: 50 ug via INTRAVENOUS

## 2018-11-23 MED ORDER — FENTANYL CITRATE (PF) 100 MCG/2ML IJ SOLN
INTRAMUSCULAR | Status: AC
  Filled 2018-11-23: qty 2

## 2018-11-23 MED ORDER — PROPOFOL 10 MG/ML IV BOLUS
INTRAVENOUS | Status: AC
  Filled 2018-11-23: qty 40

## 2018-11-23 MED ORDER — PROPOFOL 10 MG/ML IV BOLUS
INTRAVENOUS | Status: DC | PRN
  Administered 2018-11-23: 30 mg via INTRAVENOUS
  Administered 2018-11-23: 40 mg via INTRAVENOUS
  Administered 2018-11-23: 50 mg via INTRAVENOUS
  Administered 2018-11-23: 30 mg via INTRAVENOUS

## 2018-11-23 MED ORDER — LIDOCAINE HCL (PF) 2 % IJ SOLN
INTRAMUSCULAR | Status: AC
  Filled 2018-11-23: qty 10

## 2018-11-23 MED ORDER — LIDOCAINE HCL (CARDIAC) PF 100 MG/5ML IV SOSY
PREFILLED_SYRINGE | INTRAVENOUS | Status: DC | PRN
  Administered 2018-11-23: 50 mg via INTRAVENOUS

## 2018-11-23 MED ORDER — SODIUM CHLORIDE 0.9 % IV SOLN
INTRAVENOUS | Status: DC
  Administered 2018-11-23: 10:00:00 1000 mL via INTRAVENOUS

## 2018-11-23 NOTE — Anesthesia Preprocedure Evaluation (Addendum)
Anesthesia Evaluation  Patient identified by MRN, date of birth, ID band Patient awake    Reviewed: Allergy & Precautions, H&P , NPO status , [name] Chart, lab work & pertinent test results  Airway Mallampati: II  TM Distance: >3 FB     Dental  (+) Upper Dentures, Lower Dentures   Pulmonary asthma , COPD,  COPD inhaler, former smoker,           Cardiovascular hypertension,      Neuro/Psych Anxiety CVA negative psych ROS   GI/Hepatic Neg liver ROS, GERD  Controlled,  Endo/Other  diabetes  Renal/GU negative Renal ROS  negative genitourinary   Musculoskeletal   Abdominal   Peds  Hematology negative hematology ROS (+)   Anesthesia Other Findings Past Medical History: No date: Anxiety No date: Asthma No date: Chronic pruritus No date: Cirrhosis of liver (HCC) No date: COPD (chronic obstructive pulmonary disease) (HCC) No date: Diabetes mellitus without complication (HCC) No date: GERD (gastroesophageal reflux disease) No date: Hematuria, microscopic No date: History of uterine fibroid No date: History of ventral hernia No date: Hyperlipidemia No date: Hypertension No date: IBS (irritable bowel syndrome) 11/03/2018: Osteoporosis with current pathological fracture 09/20/2015: Personal history of tobacco use, presenting hazards to  health No date: Proteinuria No date: Stroke ([place]) 10/04/2014: Tobacco abuse No date: Vitamin B 12 deficiency No date: Vitamin D deficiency  Past Surgical History: No date: COLON SURGERY No date: HERNIA REPAIR No date: PARTIAL COLECTOMY No date: TONSILLECTOMY No date: VENTRAL HERNIA REPAIR  BMI    Body Mass Index: 33.76 kg/m      Reproductive/Obstetrics negative OB ROS                           Anesthesia Physical Anesthesia Plan  ASA: III  Anesthesia Plan: General   Post-op Pain Management:    Induction:   PONV Risk Score and Plan: Propofol  infusion and TIVA  Airway Management Planned: Natural Airway and Nasal Cannula  Additional Equipment:   Intra-op Plan:   Post-operative Plan:   Informed Consent: I have reviewed the patients History and Physical, chart, labs and discussed the procedure including the risks, benefits and alternatives for the proposed anesthesia with the patient or authorized representative who has indicated his/her understanding and acceptance.     Dental Advisory Given  Plan Discussed with: Anesthesiologist and CRNA  Anesthesia Plan Comments:        Anesthesia Quick Evaluation

## 2018-11-23 NOTE — Interval H&P Note (Signed)
History and Physical Interval Note:  11/23/2018 10:13 AM  Brenda Beasley  has presented today for surgery, with the diagnosis of IDA,CIRRHOSIS,RECTAL BLEEDING.  The various methods of treatment have been discussed with the patient and family. After consideration of risks, benefits and other options for treatment, the patient has consented to  Procedure(s): ESOPHAGOGASTRODUODENOSCOPY (EGD) WITH PROPOFOL (N/A) COLONOSCOPY WITH PROPOFOL (N/A) as a surgical intervention.  The patient's history has been reviewed, patient examined, no change in status, stable for surgery.  I have reviewed the patient's chart and labs.  Questions were answered to the patient's satisfaction.     [place], [place]

## 2018-11-23 NOTE — Transfer of Care (Signed)
Immediate Anesthesia Transfer of Care Note  Patient: Brenda Beasley  Procedure(s) Performed: ESOPHAGOGASTRODUODENOSCOPY (EGD) WITH PROPOFOL (N/A ) COLONOSCOPY WITH PROPOFOL (N/A )  Patient Location: PACU  Anesthesia Type:General  Level of Consciousness: awake  Airway & Oxygen Therapy: Patient Spontanous Breathing and Patient connected to nasal cannula oxygen  Post-op Assessment: Report given to RN and Post -op Vital signs reviewed and stable  Post vital signs: stable  Last Vitals:  Vitals Value Taken Time  BP 140/66 11/23/18 1059  Temp 36.3 C 11/23/18 1059  Pulse 85 11/23/18 1101  Resp 17 11/23/18 1101  SpO2 94 % 11/23/18 1101  Vitals shown include unvalidated device data.  Last Pain:  Vitals:   11/23/18 1059  TempSrc: Tympanic  PainSc: 0-No pain         Complications: No apparent anesthesia complications

## 2018-11-23 NOTE — Anesthesia Post-op Follow-up Note (Signed)
Anesthesia QCDR form completed.        

## 2018-11-23 NOTE — Op Note (Addendum)
[facility] Patient Name: Brenda Beasley Procedure Date: 11/23/2018 10:19 AM MRN: [id number] Account #: [id number] Date of Birth: 20-May-1950 Admit Type: Outpatient Age: 68 Room: [facility] ENDO ROOM 3 Gender: Female Note Status: Finalized Procedure:            Upper GI endoscopy Indications:          Iron deficiency anemia secondary to chronic blood loss,                        Dysphagia, Follow-up of esophageal reflux Providers:            Benay Pike. Alice Reichert MD, MD Referring MD:         Ramonita Lab, MD (Referring MD) Medicines:            Propofol per Anesthesia Complications:        No immediate complications. Procedure:            Pre-Anesthesia Assessment:                       - The risks and benefits of the procedure and the                        sedation options and risks were discussed with the                        patient. All questions were answered and informed                        consent was obtained.                       - Patient identification and proposed procedure were                        verified prior to the procedure by the nurse. The                        procedure was verified in the procedure room.                       - ASA Grade Assessment: III - A patient with severe                        systemic disease.                       - After reviewing the risks and benefits, the patient                        was deemed in satisfactory condition to undergo the                        procedure.                       After obtaining informed consent, the endoscope was                        passed under direct vision. Throughout the procedure,  the patient's blood pressure, pulse, and oxygen                        saturations were monitored continuously. The Endoscope                        was introduced through the mouth, and advanced to the                        third part of duodenum. The upper  GI endoscopy was                        accomplished without difficulty. The patient tolerated                        the procedure well. Findings:      There is no endoscopic evidence of ulcerations or varices in the entire       esophagus.      One benign-appearing, intrinsic mild stenosis was found in the distal       esophagus. This stenosis measured 1.5 cm (inner diameter) x less than       one cm (in length). The stenosis was traversed. The scope was withdrawn.       Dilation was performed with a Maloney dilator with no resistance at 74       Fr.      A 1 cm hiatal hernia was present.      Mild portal hypertensive gastropathy was found in the gastric body and       in the gastric antrum.      There is no endoscopic evidence of varices in the entire examined       stomach.      The examined duodenum was normal. Impression:           - Benign-appearing esophageal stenosis. Dilated.                       - 1 cm hiatal hernia.                       - Portal hypertensive gastropathy.                       - Normal examined duodenum.                       - No specimens collected. Recommendation:       - Monitor results to esophageal dilation                       - Repeat upper endoscopy in 2 years for surveillance.                       - Proceed with colonoscopy Procedure Code(s):    --- Professional ---                       [phone], Esophagogastroduodenoscopy, flexible, transoral;                        diagnostic, including collection of specimen(s) by  brushing or washing, when performed (separate procedure)                       43450, Dilation of esophagus, by unguided sound or                        bougie, single or multiple passes Diagnosis Code(s):    --- Professional ---                       K21.9, Gastro-esophageal reflux disease without                        esophagitis                       R13.10, Dysphagia, unspecified                        D50.0, Iron deficiency anemia secondary to blood loss                        (chronic)                       K31.89, Other diseases of stomach and duodenum                       K76.6, Portal hypertension                       K44.9, Diaphragmatic hernia without obstruction or                        gangrene                       K22.2, Esophageal obstruction CPT copyright 2019 [employer] Association. All rights reserved. The codes documented in this report are preliminary and upon coder review may  be revised to meet current compliance requirements. Efrain Sella MD, MD 11/23/2018 10:40:59 AM This report has been signed electronically. Number of Addenda: 0 Note Initiated On: 11/23/2018 10:19 AM Estimated Blood Loss: Estimated blood loss: none.      [facility]

## 2018-11-23 NOTE — Anesthesia Procedure Notes (Signed)
Date/Time: 11/23/2018 10:28 AM Performed by: Lavone Orn, CRNA Oxygen Delivery Method: Nasal cannula

## 2018-11-23 NOTE — OR Nursing (Signed)
Surgical Anastamosis reached at 1045

## 2018-11-23 NOTE — H&P (Signed)
Outpatient short stay form Pre-procedure 11/23/2018 10:12 AM Brenda Beasley, M.D.  Primary Physician: Ramonita Lab, MD  Reason for visit: Dysphagia, iron deficiency anemia, GERD, history of cirrhosis.  Patient also also had some watery diarrhea evaluated with stool studies including C. difficile which were negative.  History of present illness: As above    Current Facility-Administered Medications:  .  0.9 %  sodium chloride infusion, , Intravenous, Continuous, Hollis, Benay Pike, MD, Last Rate: 20 mL/hr at 11/23/18 0940, 1,000 mL at 11/23/18 0940  Medications Prior to Admission  Medication Sig Dispense Refill Last Dose  . Cyanocobalamin-Methylcobalamin 10000 (B12) MCG/2ML LIQD Inject into the muscle every 30 (thirty) days.     Marland Kitchen gabapentin (NEURONTIN) 100 MG capsule Take 100 mg by mouth at bedtime as needed.     . hydrochlorothiazide (HYDRODIURIL) 25 MG tablet Take 12.5 mg by mouth daily.    11/23/2018 at Unknown time  . Insulin Glargine, 2 Unit Dial, (TOUJEO MAX SOLOSTAR) 300 UNIT/ML SOPN Inject 300 Units/mL into the skin at bedtime.     Marland Kitchen lisinopril (PRINIVIL,ZESTRIL) 40 MG tablet Take 20 mg by mouth daily.    11/23/2018 at Unknown time  . albuterol (PROAIR HFA) 108 (90 Base) MCG/ACT inhaler Inhale 2 puffs into the lungs every 6 (six) hours as needed.     Marland Kitchen amLODipine (NORVASC) 5 MG tablet Take 2.5 mg by mouth daily.      Jearl Klinefelter ELLIPTA 62.5-25 MCG/INH AEPB Take 1 Inhaler by mouth daily.  4   . aspirin EC 81 MG tablet Take 81 mg by mouth daily.     Marland Kitchen CALCIUM PO Take 1 tablet by mouth every evening.     . Cholecalciferol (VITAMIN D3) 5000 units TABS Take 5,000 Units by mouth daily.      . cyanocobalamin (,VITAMIN B-12,) 1000 MCG/ML injection Inject 1,000 mcg into the muscle every 30 (thirty) days.     . ferrous sulfate 325 (65 FE) MG tablet Take 325 mg by mouth daily.     . fluticasone (FLONASE) 50 MCG/ACT nasal spray Place 2 sprays into the nose daily.     Marland Kitchen glipiZIDE (GLUCOTROL) 10 MG  tablet Take 20 mg 2 (two) times daily by mouth.      Marland Kitchen HYDROcodone-homatropine (HYCODAN) 5-1.5 MG/5ML syrup Take 5 mLs by mouth every 6 (six) hours as needed for cough. 120 mL 0   . ipratropium-albuterol (DUONEB) 0.5-2.5 (3) MG/3ML SOLN Take 3 mLs 4 (four) times daily as needed by nebulization.     Marland Kitchen loperamide (IMODIUM) 2 MG capsule Take 1 capsule (2 mg total) every 6 (six) hours as needed by mouth for diarrhea or loose stools. 30 capsule 0   . lovastatin (MEVACOR) 40 MG tablet Take 2 tablets by mouth daily.     Marland Kitchen MAGNESIUM PO Take 1 tablet by mouth at bedtime.      . metFORMIN (GLUCOPHAGE) 1000 MG tablet Take 1,000 mg by mouth 2 (two) times daily.     . naproxen (NAPROSYN) 375 MG tablet Take 1 tablet (375 mg total) by mouth 2 (two) times daily with a meal. 14 tablet 0   . omeprazole (PRILOSEC) 20 MG capsule Take 20 mg by mouth 2 (two) times daily.     . ondansetron (ZOFRAN ODT) 4 MG disintegrating tablet Take 1 tablet (4 mg total) by mouth every 8 (eight) hours as needed. 20 tablet 0      Allergies  Allergen Reactions  . Molds & Smuts Shortness Of Breath  .  Other Shortness Of Breath and Hives    Cats hives Dust causes sneezing Affects breathing Tape-unknown  . Penicillins Hives and Swelling    Throat swell  Has patient had a PCN reaction causing immediate rash, facial/tongue/throat swelling, SOB or lightheadedness with hypotension: Yes Has patient had a PCN reaction causing severe rash involving mucus membranes or skin necrosis: Unknown Has patient had a PCN reaction that required hospitalization: Unknown Has patient had a PCN reaction occurring within the last 10 years: Unknown If all of the above answers are "NO", then may proceed with Cephalosporin use.   . Pioglitazone Other (See Comments) and Palpitations    Chest pain, numbness in fingers,   . Byetta 10 Mcg Pen [Exenatide] Other (See Comments)    Severe stomach pains  . Dapagliflozin Other (See Comments)     Intolerant/dyspnea  . Jardiance [Empagliflozin] Other (See Comments)  . Victoza [Liraglutide] Other (See Comments)    Severe stomach pains  . Sulfa Antibiotics Rash  . Sulfasalazine Rash     Past Medical History:  Diagnosis Date  . Anxiety   . Asthma   . Chronic pruritus   . Cirrhosis of liver ([place])   . COPD (chronic obstructive pulmonary disease) ([place])   . Diabetes mellitus without complication ([place])   . GERD (gastroesophageal reflux disease)   . Hematuria, microscopic   . History of uterine fibroid   . History of ventral hernia   . Hyperlipidemia   . Hypertension   . IBS (irritable bowel syndrome)   . Osteoporosis with current pathological fracture 11/03/2018  . Personal history of tobacco use, presenting hazards to health 09/20/2015  . Proteinuria   . Stroke ([place])   . Tobacco abuse 10/04/2014  . Vitamin B 12 deficiency   . Vitamin D deficiency     Review of systems:  Otherwise negative.    Physical Exam  Gen: Alert, oriented. Appears stated age.  HEENT: [place]/AT. PERRLA. Lungs: CTA, no wheezes. CV: RR nl S1, S2. Abd: soft, benign, no masses. BS+ Ext: No edema. Pulses 2+    Planned procedures: Proceed with EGD and colonoscopy. The patient understands the nature of the planned procedure, indications, risks, alternatives and potential complications including but not limited to bleeding, infection, perforation, damage to internal organs and possible oversedation/side effects from anesthesia. The patient agrees and gives consent to proceed.  Please refer to procedure notes for findings, recommendations and patient disposition/instructions.     Haroun Cotham K. Alice Beasley, M.D. Gastroenterology 11/23/2018  10:12 AM

## 2018-11-23 NOTE — Anesthesia Postprocedure Evaluation (Signed)
Anesthesia Post Note  Patient: Brenda Beasley  Procedure(s) Performed: ESOPHAGOGASTRODUODENOSCOPY (EGD) WITH PROPOFOL (N/A ) COLONOSCOPY WITH PROPOFOL (N/A )  Patient location during evaluation: Endoscopy Anesthesia Type: General Level of consciousness: awake and alert Pain management: pain level controlled Vital Signs Assessment: post-procedure vital signs reviewed and stable Respiratory status: spontaneous breathing, nonlabored ventilation, respiratory function stable and patient connected to nasal cannula oxygen Cardiovascular status: blood pressure returned to baseline and stable Postop Assessment: no apparent nausea or vomiting Anesthetic complications: no     Last Vitals:  Vitals:   11/23/18 1129 11/23/18 1139  BP: (!) 154/64 (!) 152/51  Pulse: 70 72  Resp: 17 14  Temp:    SpO2: 96% 93%    Last Pain:  Vitals:   11/23/18 1139  TempSrc:   PainSc: 0-No pain                 Precious Haws [name]

## 2018-11-23 NOTE — Op Note (Signed)
[facility] Gastroenterology Patient Name: Brenda Beasley Procedure Date: 11/23/2018 10:18 AM MRN: [id number] Account #: [id number] Date of Birth: 1951-05-02 Admit Type: Outpatient Age: 68 Room: [facility] ENDO ROOM 3 Gender: Female Note Status: Finalized Procedure:            Colonoscopy Indications:          Clinically significant diarrhea of unexplained origin,                        Iron deficiency anemia secondary to chronic blood loss Providers:            Benay Pike. Alice Reichert MD, MD Referring MD:         Ramonita Lab, MD (Referring MD) Medicines:            Propofol per Anesthesia Complications:        No immediate complications. Procedure:            Pre-Anesthesia Assessment:                       - The risks and benefits of the procedure and the                        sedation options and risks were discussed with the                        patient. All questions were answered and informed                        consent was obtained.                       - Patient identification and proposed procedure were                        verified prior to the procedure by the nurse. The                        procedure was verified in the procedure room.                       - ASA Grade Assessment: III - A patient with severe                        systemic disease.                       After obtaining informed consent, the colonoscope was                        passed under direct vision. Throughout the procedure,                        the patient's blood pressure, pulse, and oxygen                        saturations were monitored continuously. The                        Colonoscope was introduced through the anus and  advanced to the the ileocolonic anastomosis. The                        colonoscopy was performed without difficulty. The                        patient tolerated the procedure well. The quality of                        the  bowel preparation was adequate. Ileocolonic                        anastomosis were photographed. Findings:      The perianal exam findings include internal hemorrhoids that prolapse       with straining, but require manual replacement into the anal canal       (Grade III).      The digital rectal exam was normal. Pertinent negatives include normal       sphincter tone and no palpable rectal lesions.      Multiple small and large-mouthed diverticula were found in the sigmoid       colon.      A single (solitary) fifteen mm ulcer was found at the anastomosis. No       bleeding was present. No stigmata of recent bleeding were seen. Biopsies       were taken with a cold forceps for histology. Biopsies were taken with a       cold forceps for histology.      The distal ileum contained a benign-appearing, intrinsic severe stenosis       measuring unknown x 6 mm (inner diameter) that was non-traversed.       Biopsies were taken with a cold forceps for histology.      The exam was otherwise without abnormality. Impression:           - Internal hemorrhoids that prolapse with straining,                        but require manual replacement into the anal canal                        (Grade III) found on perianal exam.                       - Diverticulosis in the sigmoid colon.                       - A single (solitary) ulcer at the colonic anastomosis.                        Biopsied.                       - Stricture in the distal ileum. Biopsied.                       - The examination was otherwise normal. Recommendation:       - Monitor results to esophageal dilation                       - Patient has a contact number available for  emergencies. The signs and symptoms of potential                        delayed complications were discussed with the patient.                        Return to normal activities tomorrow. Written discharge                         instructions were provided to the patient.                       - Resume previous diet.                       - Continue present medications.                       - Perform a small bowel follow through at appointment                        to be scheduled.                       - Return to physician assistant in 2 weeks.                       - The findings and recommendations were discussed with                        the patient. Procedure Code(s):    --- Professional ---                       [phone], Colonoscopy, flexible; with biopsy, single or                        multiple Diagnosis Code(s):    --- Professional ---                       K57.30, Diverticulosis of large intestine without                        perforation or abscess without bleeding                       D50.0, Iron deficiency anemia secondary to blood loss                        (chronic)                       R19.7, Diarrhea, unspecified                       K56.699, Other intestinal obstruction unspecified as to                        partial versus complete obstruction                       K63.3, Ulcer of intestine                       K64.2,  Third degree hemorrhoids CPT copyright 2019 American Medical Association. All rights reserved. The codes documented in this report are preliminary and upon coder review may  be revised to meet current compliance requirements. Efrain Sella MD, MD 11/23/2018 10:59:36 AM This report has been signed electronically. Number of Addenda: 0 Note Initiated On: 11/23/2018 10:18 AM Scope Withdrawal Time: 0 hours 8 minutes 1 second  Total Procedure Duration: 0 hours 10 minutes 56 seconds  Estimated Blood Loss: Estimated blood loss was minimal.      [facility]

## 2018-11-24 ENCOUNTER — Other Ambulatory Visit: Payer: Self-pay | Admitting: Student

## 2018-11-24 DIAGNOSIS — K289 Gastrojejunal ulcer, unspecified as acute or chronic, without hemorrhage or perforation: Secondary | ICD-10-CM

## 2018-11-24 DIAGNOSIS — R197 Diarrhea, unspecified: Secondary | ICD-10-CM

## 2018-11-24 DIAGNOSIS — K9189 Other postprocedural complications and disorders of digestive system: Secondary | ICD-10-CM

## 2018-11-24 LAB — SURGICAL PATHOLOGY

## 2018-11-30 ENCOUNTER — Other Ambulatory Visit: Payer: Self-pay

## 2018-11-30 ENCOUNTER — Ambulatory Visit
Admission: RE | Admit: 2018-11-30 | Discharge: 2018-11-30 | Disposition: A | Discharge status: Home / Self Care | Payer: Medicare Other | Source: Ambulatory Visit | Attending: Student | Admitting: Student

## 2018-11-30 DIAGNOSIS — K9189 Other postprocedural complications and disorders of digestive system: Secondary | ICD-10-CM | POA: Diagnosis present

## 2018-11-30 DIAGNOSIS — K289 Gastrojejunal ulcer, unspecified as acute or chronic, without hemorrhage or perforation: Secondary | ICD-10-CM | POA: Insufficient documentation

## 2018-11-30 DIAGNOSIS — R197 Diarrhea, unspecified: Secondary | ICD-10-CM | POA: Insufficient documentation

## 2018-12-19 ENCOUNTER — Other Ambulatory Visit: Payer: Self-pay | Admitting: Student

## 2018-12-19 DIAGNOSIS — R1312 Dysphagia, oropharyngeal phase: Secondary | ICD-10-CM

## 2019-02-03 ENCOUNTER — Ambulatory Visit
Admission: RE | Admit: 2019-02-03 | Discharge: 2019-02-03 | Disposition: A | Discharge status: Home / Self Care | Payer: Medicare Other | Source: Ambulatory Visit | Attending: Student | Admitting: Student

## 2019-02-03 ENCOUNTER — Other Ambulatory Visit: Payer: Self-pay

## 2019-02-03 DIAGNOSIS — R1312 Dysphagia, oropharyngeal phase: Secondary | ICD-10-CM | POA: Insufficient documentation

## 2019-02-03 DIAGNOSIS — R1314 Dysphagia, pharyngoesophageal phase: Secondary | ICD-10-CM | POA: Diagnosis present

## 2019-02-03 NOTE — Therapy (Addendum)
[place], [place], [address] Phone: [phone]   Fax:     Modified Barium Swallow  Patient Details  Name: Brenda Beasley MRN: [id number] Date of Birth: 20-Jul-1950 No data recorded  Encounter Date: 02/03/2019  End of Session - 02/03/19 1233    Visit Number  1    Number of Visits  1    Date for SLP Re-Evaluation  02/03/19    SLP Start Time  [id number]    SLP Stop Time   1115    SLP Time Calculation (min)  60 min    Activity Tolerance  Patient tolerated treatment well       Past Medical History:  Diagnosis Date  . Anxiety   . Asthma   . Chronic pruritus   . Cirrhosis of liver ([place])   . COPD (chronic obstructive pulmonary disease) ([place])   . Diabetes mellitus without complication ([place])   . GERD (gastroesophageal reflux disease)   . Hematuria, microscopic   . History of uterine fibroid   . History of ventral hernia   . Hyperlipidemia   . Hypertension   . IBS (irritable bowel syndrome)   . Osteoporosis with current pathological fracture 11/03/2018  . Personal history of tobacco use, presenting hazards to health 09/20/2015  . Proteinuria   . Stroke ([place])   . Tobacco abuse 10/04/2014  . Vitamin B 12 deficiency   . Vitamin D deficiency     Past Surgical History:  Procedure Laterality Date  . COLON SURGERY    . COLONOSCOPY WITH PROPOFOL N/A 11/23/2018   Procedure: COLONOSCOPY WITH PROPOFOL;  Surgeon: Toledo, Benay Pike, MD;  Location: [facility] ENDOSCOPY;  Service: Gastroenterology;  Laterality: N/A;  . ESOPHAGOGASTRODUODENOSCOPY (EGD) WITH PROPOFOL N/A 11/23/2018   Procedure: ESOPHAGOGASTRODUODENOSCOPY (EGD) WITH PROPOFOL;  Surgeon: Toledo, Benay Pike, MD;  Location: [facility] ENDOSCOPY;  Service: Gastroenterology;  Laterality: N/A;  . HERNIA REPAIR    . PARTIAL COLECTOMY    . TONSILLECTOMY    . VENTRAL HERNIA REPAIR      There were no vitals filed for this visit.       Subjective: Patient behavior:  (alertness, ability to follow instructions, etc.): pt alert; verbally engaging. She gave details of medical history and swallowing problems. Min tremmorous activity noted in hands. She denied any recent Neurology history(old CVA); denied any recent Pulmonary dxs. OM exam WFL; dentures. Chief complaint: dysphagia.  Pt reported a recent EGD (11/2018) which revealed esophageal stenosis w/ dilation(has had other dilations per her report); Hiatal Hernia. Pt endorsed baseline of GERD/Reflux and is on a PPI. She feels that the recent Dilation gave her "some easier swallowing" but that she continues to have intermittent problems "getting something hung" pointing to just above her sternal notch area -- there is no specific food/drink that will cause this. She does avoid nuts, chips, pretzels "most of the time".    Objective:  Radiological Procedure: A videoflouroscopic evaluation of oral-preparatory, reflex initiation, and pharyngeal phases of the swallow was performed; as well as a screening of the upper esophageal phase.  I. POSTURE: upright II. VIEW: lateral III. COMPENSATORY STRATEGIES: NONE for oropharyngeal swallowing. TIME b/t trials for Esophageal clearing. IV. BOLUSES ADMINISTERED:  Thin Liquid: 6 trials  Nectar-thick Liquid: 2 trials  Honey-thick Liquid: NT  Puree: 3 trials  Mechanical Soft: 2 trials V. RESULTS OF EVALUATION: A. ORAL PREPARATORY PHASE: (The lips, tongue, and velum are observed for strength and coordination)       **  Overall Severity Rating: WFL. Noted timely bolus management/mastication, A-P transfer of trials, and full oral clearing b/t trials.  B. SWALLOW INITIATION/REFLEX: (The reflex is normal if "triggered" by the time the bolus reached the base of the tongue)  **Overall Severity Rating: [facility]. Noted timely pharyngeal swallow initiation noted w/ all trial consistencies at the level of BOT-Valleculae. Adequate airway closure (during the swallow) w/ No aspiration or laryngeal  penetration noted during the swallow.  C. PHARYNGEAL PHASE: (Pharyngeal function is normal if the bolus shows rapid, smooth, and continuous transit through the pharynx and there is no pharyngeal residue after the swallow)  **Overall Severity Rating: [facility]. No significant pharyngeal residue remaining post swallows indicating adequate laryngeal excursion and BOT contact/pharyngeal pressure during the swallows.  D. LARYNGEAL PENETRATION: (Material entering into the laryngeal inlet/vestibule but not aspirated): NONE E. ASPIRATION: NONE F. ESOPHAGEAL PHASE: (Screening of the upper esophagus): a prominent Cricopharyngeus Muscle was noted during swallowing --- narrowing of the Cervical Esophagus from both the posterior and anterior directions.    ASSESSMENT: Pt appears to present w/ No oropharyngeal phase dysphagia --- an overall functional oropharyngeal swallow w/ consistencies assessed at this evaluation. 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 Any Esophageal phase dysmotility and Retrograde activity of food/liquid material back into the pharynx can increase risk for aspiration of the Backflow, food/liquid material, which can in turn impact Pulmonary status as well as oral intake in general.  During the oral phase, pt exhibited timely bolus management/mastication, A-P transfer of trials, and full oral clearing b/t trials. During the pharyngeal phase, timely pharyngeal swallow initiation noted w/ all trial consistencies at the level of BOT-Valleculae. Adequate airway closure (during the swallow) w/ No aspiration or penetration noted during the swallow. No significant pharyngeal residue remaining post swallows indicating  adequate laryngeal excursion and BOT contact/pharyngeal pressure during the swallows. However, during the Esophageal phase, a prominent Cricopharyngeus Muscle was noted during swallowing --- a narrowing of the Cervical Esophagus from both the posterior and anterior directions. Esophageal motility in this area appeared adequate (w/ trials given at this Study), however, w/ other food consistencies and/or liquids such as pt c/o when at home, Esophageal dysmotility and Retrograde bolus activity could potentially occur as she describes. Any Retrograde or Backflow activity from Cervical Esophagus superiorly back into the Pyriform Sinuses (secondary to the Prominent Cricopharyngeus Muscle) could increase risk for aspiration of the Reflux, food/liquid material. Discussed use of strategy of TIME b/t bites/sips to allow for Esophageal clearing as well as taking Small bites/sips Slowly, and eating/drinking w/ Less Distractions/Talking --- less Stress during oral intake. Pt fed self independently.   PLAN/RECOMMENDATIONS:  A. Diet: Mech Soft diet w/ meats/food cut SMALL and moistened w/ Gravy; Thin liquids. Recommend Pills broken down in Puree and/or in a crushed or liquid form  B. Swallowing Precautions: general aspiration precautions to include a f/u, DRY swallow as needed if any feeling of residue in throat  C. Recommended consultation to: f/u w/ ENT, GI for ongoing management of prominent Cricopharyngeus Muscle and REFLUX including PPI.   D. Therapy recommendations: None  E. Results and recommendations were discussed w/ patient; video viewed and questions answered after. Discussion/education on precautions/strategies and swallowing overall.           Dysphagia, pharyngoesophageal  phase  Oropharyngeal dysphagia - Plan: DG SWALLOW FUNC OP MEDICARE SPEECH PATH, DG SWALLOW FUNC OP MEDICARE SPEECH PATH        Problem List Patient Active Problem List   Diagnosis Date Noted  . B12 deficiency 04/14/2017   . Drug-induced urticaria 04/14/2017  . GERD (gastroesophageal reflux disease) 04/14/2017  . Hyperlipidemia, mixed 04/14/2017  . Microscopic hematuria 04/14/2017  . Uncontrolled type 2 diabetes mellitus with complication, without long-term current use of insulin ([place]) 04/14/2017  . Vitamin D deficiency, unspecified 04/14/2017  . Colitis 04/02/2017  . Age-related osteoporosis without current pathological fracture 11/13/2016  . Thoracic aortic atherosclerosis ([place]) 01/07/2016  . Facet arthritis of lumbar region 12/19/2015  . Personal history of tobacco use, presenting hazards to health 09/20/2015  . COPD with emphysema ([place]) 09/03/2015  . Essential hypertension 09/03/2015  . Proteinuria 03/05/2015  . Tobacco abuse 10/04/2014  . DDD (degenerative disc disease), lumbar 04/05/2014  . Greater trochanteric bursitis of both hips 04/05/2014  . Lumbar radiculopathy 04/05/2014        Orinda Kenner, MS, CCC-SLP Watson,Katherine 02/03/2019, 12:35 PM  [facility] [place], [place], [address] Phone: [phone]   Fax:     Name: Brenda Beasley MRN: [id number] Date of Birth: 10-09-1950

## 2019-03-07 ENCOUNTER — Other Ambulatory Visit: Payer: Self-pay | Admitting: Internal Medicine

## 2019-03-07 DIAGNOSIS — R1084 Generalized abdominal pain: Secondary | ICD-10-CM

## 2019-03-14 ENCOUNTER — Other Ambulatory Visit: Payer: Self-pay

## 2019-03-14 ENCOUNTER — Ambulatory Visit
Admission: RE | Admit: 2019-03-14 | Discharge: 2019-03-14 | Disposition: A | Discharge status: Home / Self Care | Payer: Medicare Other | Source: Ambulatory Visit | Attending: Internal Medicine | Admitting: Internal Medicine

## 2019-03-14 DIAGNOSIS — R1084 Generalized abdominal pain: Secondary | ICD-10-CM | POA: Insufficient documentation

## 2019-03-14 MED ORDER — IOHEXOL 300 MG/ML  SOLN
100.0000 mL | Freq: Once | INTRAMUSCULAR | Status: AC | PRN
  Administered 2019-03-14: 100 mL via INTRAVENOUS

## 2019-10-23 ENCOUNTER — Telehealth: Payer: Self-pay

## 2019-10-23 DIAGNOSIS — Z122 Encounter for screening for malignant neoplasm of respiratory organs: Secondary | ICD-10-CM

## 2019-10-23 DIAGNOSIS — Z87891 Personal history of nicotine dependence: Secondary | ICD-10-CM

## 2019-10-23 NOTE — Telephone Encounter (Signed)
Message left notifying patient that it is time to schedule the low dose lung cancer screening CT scan.  Instructed patient to return call to Shawn Perkins at [phone] to verify information prior to CT scan being scheduled.    

## 2019-10-24 NOTE — Telephone Encounter (Signed)
Patient has been notified that annual lung cancer screening low dose CT scan is due currently or will be in near future. Confirmed that patient is within the age range of 55-77, and asymptomatic, (no signs or symptoms of lung cancer). Patient denies illness that would prevent curative treatment for lung cancer if found. Verified smoking history, (former, quit 06/12/18, 51.5 pack year). The shared decision making visit was done 10/05/14. Patient is agreeable for CT scan being scheduled.

## 2019-10-24 NOTE — Addendum Note (Signed)
Addended by: Lieutenant Diego on: 10/24/2019 09:29 AM   Modules accepted: Orders

## 2019-11-01 ENCOUNTER — Ambulatory Visit
Admission: RE | Admit: 2019-11-01 | Discharge: 2019-11-01 | Disposition: A | Discharge status: Home / Self Care | Payer: Medicare Other | Source: Ambulatory Visit | Attending: Oncology | Admitting: Oncology

## 2019-11-01 ENCOUNTER — Other Ambulatory Visit: Payer: Self-pay

## 2019-11-01 DIAGNOSIS — Z87891 Personal history of nicotine dependence: Secondary | ICD-10-CM | POA: Diagnosis not present

## 2019-11-01 DIAGNOSIS — Z122 Encounter for screening for malignant neoplasm of respiratory organs: Secondary | ICD-10-CM | POA: Insufficient documentation

## 2019-11-07 ENCOUNTER — Encounter: Payer: Self-pay | Admitting: *Deleted

## 2019-11-26 ENCOUNTER — Other Ambulatory Visit: Payer: Self-pay

## 2019-11-26 ENCOUNTER — Emergency Department
Admission: EM | Admit: 2019-11-26 | Discharge: 2019-11-26 | Disposition: A | Discharge status: Home / Self Care | Payer: Medicare Other | Attending: Emergency Medicine | Admitting: Emergency Medicine

## 2019-11-26 DIAGNOSIS — Z7982 Long term (current) use of aspirin: Secondary | ICD-10-CM | POA: Diagnosis not present

## 2019-11-26 DIAGNOSIS — W57XXXA Bitten or stung by nonvenomous insect and other nonvenomous arthropods, initial encounter: Secondary | ICD-10-CM

## 2019-11-26 DIAGNOSIS — Z7951 Long term (current) use of inhaled steroids: Secondary | ICD-10-CM | POA: Insufficient documentation

## 2019-11-26 DIAGNOSIS — Y929 Unspecified place or not applicable: Secondary | ICD-10-CM | POA: Insufficient documentation

## 2019-11-26 DIAGNOSIS — J45909 Unspecified asthma, uncomplicated: Secondary | ICD-10-CM | POA: Insufficient documentation

## 2019-11-26 DIAGNOSIS — Z87891 Personal history of nicotine dependence: Secondary | ICD-10-CM | POA: Diagnosis not present

## 2019-11-26 DIAGNOSIS — S1086XD Insect bite of other specified part of neck, subsequent encounter: Secondary | ICD-10-CM | POA: Insufficient documentation

## 2019-11-26 DIAGNOSIS — J449 Chronic obstructive pulmonary disease, unspecified: Secondary | ICD-10-CM | POA: Insufficient documentation

## 2019-11-26 DIAGNOSIS — Z8673 Personal history of transient ischemic attack (TIA), and cerebral infarction without residual deficits: Secondary | ICD-10-CM | POA: Diagnosis not present

## 2019-11-26 DIAGNOSIS — W57XXXD Bitten or stung by nonvenomous insect and other nonvenomous arthropods, subsequent encounter: Secondary | ICD-10-CM | POA: Insufficient documentation

## 2019-11-26 DIAGNOSIS — Y999 Unspecified external cause status: Secondary | ICD-10-CM | POA: Insufficient documentation

## 2019-11-26 DIAGNOSIS — E119 Type 2 diabetes mellitus without complications: Secondary | ICD-10-CM | POA: Diagnosis not present

## 2019-11-26 DIAGNOSIS — Y939 Activity, unspecified: Secondary | ICD-10-CM | POA: Diagnosis not present

## 2019-11-26 DIAGNOSIS — Z79899 Other long term (current) drug therapy: Secondary | ICD-10-CM | POA: Insufficient documentation

## 2019-11-26 DIAGNOSIS — Z794 Long term (current) use of insulin: Secondary | ICD-10-CM | POA: Insufficient documentation

## 2019-11-26 MED ORDER — LIDOCAINE-EPINEPHRINE-TETRACAINE (LET) TOPICAL GEL
3.0000 mL | Freq: Once | TOPICAL | Status: AC
  Administered 2019-11-26: 3 mL via TOPICAL
  Filled 2019-11-26: qty 3

## 2019-11-26 MED ORDER — DOXYCYCLINE HYCLATE 100 MG PO CAPS: 100.0000 mg | ORAL_CAPSULE | Freq: Two times a day (BID) | ORAL | 0 refills | Status: DC

## 2019-11-26 MED ORDER — BACITRACIN-NEOMYCIN-POLYMYXIN 400-5-5000 EX OINT
TOPICAL_OINTMENT | Freq: Once | CUTANEOUS | Status: AC
  Administered 2019-11-26: 1 via TOPICAL

## 2019-11-26 NOTE — Discharge Instructions (Signed)
Apply warm compress to the tick bite area.  Follow-up with your regular doctor if not better in 3 to 4 days.  Return emergency department worsening.  The antibiotic we are placing you on will cover [facility] spotted fever and Lyme's disease.

## 2019-11-26 NOTE — ED Notes (Signed)
First Nurse Note: Pt to ED c/o tick on the left side of her neck. Pt is in NAD.

## 2019-11-26 NOTE — ED Notes (Signed)
See triage note; pt is in no acute distress at this time; tick noted on left side of neck.

## 2019-11-26 NOTE — ED Provider Notes (Signed)
[facility] Emergency Department Provider Note  ____________________________________________   None    (approximate)  I have reviewed the triage vital signs and the nursing notes.   HISTORY  Chief Complaint Tick Removal    HPI DAVITA SUBLETT is a 69 y.o. female presents emergency department complaining of a tick on the left side of her neck.  States she thought it was a skin tag and the tick is been there approximately 1 week.  States the area is very sore and swollen.  She said no fever or chills.  No chest pain or shortness of breath.    Past Medical History:  Diagnosis Date  . Anxiety   . Asthma   . Chronic pruritus   . Cirrhosis of liver ([place])   . COPD (chronic obstructive pulmonary disease) ([place])   . Diabetes mellitus without complication ([place])   . GERD (gastroesophageal reflux disease)   . Hematuria, microscopic   . History of uterine fibroid   . History of ventral hernia   . Hyperlipidemia   . Hypertension   . IBS (irritable bowel syndrome)   . Osteoporosis with current pathological fracture 11/03/2018  . Personal history of tobacco use, presenting hazards to health 09/20/2015  . Proteinuria   . Stroke ([place])   . Tobacco abuse 10/04/2014  . Vitamin B 12 deficiency   . Vitamin D deficiency     Patient Active Problem List   Diagnosis Date Noted  . B12 deficiency 04/14/2017  . Drug-induced urticaria 04/14/2017  . GERD (gastroesophageal reflux disease) 04/14/2017  . Hyperlipidemia, mixed 04/14/2017  . Microscopic hematuria 04/14/2017  . Uncontrolled type 2 diabetes mellitus with complication, without long-term current use of insulin ([place]) 04/14/2017  . Vitamin D deficiency, unspecified 04/14/2017  . Colitis 04/02/2017  . Age-related osteoporosis without current pathological fracture 11/13/2016  . Thoracic aortic atherosclerosis ([place]) 01/07/2016  . Facet arthritis of lumbar region 12/19/2015  . Personal history of tobacco use,  presenting hazards to health 09/20/2015  . COPD with emphysema ([place]) 09/03/2015  . Essential hypertension 09/03/2015  . Proteinuria 03/05/2015  . Tobacco abuse 10/04/2014  . DDD (degenerative disc disease), lumbar 04/05/2014  . Greater trochanteric bursitis of both hips 04/05/2014  . Lumbar radiculopathy 04/05/2014    Past Surgical History:  Procedure Laterality Date  . COLON SURGERY    . COLONOSCOPY WITH PROPOFOL N/A 11/23/2018   Procedure: COLONOSCOPY WITH PROPOFOL;  Surgeon: Toledo, Benay Pike, MD;  Location: [facility] ENDOSCOPY;  Service: Gastroenterology;  Laterality: N/A;  . ESOPHAGOGASTRODUODENOSCOPY (EGD) WITH PROPOFOL N/A 11/23/2018   Procedure: ESOPHAGOGASTRODUODENOSCOPY (EGD) WITH PROPOFOL;  Surgeon: Toledo, Benay Pike, MD;  Location: [facility] ENDOSCOPY;  Service: Gastroenterology;  Laterality: N/A;  . HERNIA REPAIR    . PARTIAL COLECTOMY    . TONSILLECTOMY    . VENTRAL HERNIA REPAIR      Prior to Admission medications   Medication Sig Start Date End Date Taking? Authorizing Provider  albuterol (PROAIR HFA) 108 (90 Base) MCG/ACT inhaler Inhale 2 puffs into the lungs every 6 (six) hours as needed. 03/05/15   [provider]  amLODipine (NORVASC) 5 MG tablet Take 2.5 mg by mouth daily.  03/05/15   [provider]  Celedonio Miyamoto 62.5-25 MCG/INH AEPB Take 1 Inhaler by mouth daily. 09/08/17   [provider]  aspirin EC 81 MG tablet Take 81 mg by mouth daily.    [provider]  CALCIUM PO Take 1 tablet by mouth every evening.  [provider]  Cholecalciferol (VITAMIN D3) 5000 units TABS Take 5,000 Units by mouth daily.     [provider]  cyanocobalamin (,VITAMIN B-12,) 1000 MCG/ML injection Inject 1,000 mcg into the muscle every 30 (thirty) days. 10/23/14   [provider]  Cyanocobalamin-Methylcobalamin 10000 (B12) MCG/2ML LIQD Inject into the muscle every 30 (thirty) days.    [provider]  doxycycline (VIBRAMYCIN)  100 MG capsule Take 1 capsule (100 mg total) by mouth 2 (two) times daily. 11/26/19   Alesi Zachery, Linden Dolin, PA-C  ferrous sulfate 325 (65 FE) MG tablet Take 325 mg by mouth daily. 03/15/15   [provider]  fluticasone (FLONASE) 50 MCG/ACT nasal spray Place 2 sprays into the nose daily. 03/05/15   [provider]  gabapentin (NEURONTIN) 100 MG capsule Take 100 mg by mouth at bedtime as needed.    [provider]  glipiZIDE (GLUCOTROL) 10 MG tablet Take 20 mg 2 (two) times daily by mouth.  03/15/15   [provider]  hydrochlorothiazide (HYDRODIURIL) 25 MG tablet Take 12.5 mg by mouth daily.  03/05/15   [provider]  HYDROcodone-homatropine (HYCODAN) 5-1.5 MG/5ML syrup Take 5 mLs by mouth every 6 (six) hours as needed for cough. 10/17/17   Hinda Kehr, MD  Insulin Glargine, 2 Unit Dial, (TOUJEO MAX SOLOSTAR) 300 UNIT/ML SOPN Inject 300 Units/mL into the skin at bedtime.    [provider]  ipratropium-albuterol (DUONEB) 0.5-2.5 (3) MG/3ML SOLN Take 3 mLs 4 (four) times daily as needed by nebulization.    [provider]  lisinopril (PRINIVIL,ZESTRIL) 40 MG tablet Take 20 mg by mouth daily.  03/05/15   [provider]  loperamide (IMODIUM) 2 MG capsule Take 1 capsule (2 mg total) every 6 (six) hours as needed by mouth for diarrhea or loose stools. 04/05/17   Dustin Flock, MD  lovastatin (MEVACOR) 40 MG tablet Take 2 tablets by mouth daily. 03/05/15   [provider]  MAGNESIUM PO Take 1 tablet by mouth at bedtime.     [provider]  metFORMIN (GLUCOPHAGE) 1000 MG tablet Take 1,000 mg by mouth 2 (two) times daily. 03/05/15   [provider]  naproxen (NAPROSYN) 375 MG tablet Take 1 tablet (375 mg total) by mouth 2 (two) times daily with a meal. 02/20/18   Baity, Coralie Keens, NP  omeprazole (PRILOSEC) 20 MG capsule Take 20 mg by mouth 2 (two) times daily. 03/05/15   [provider]  ondansetron  (ZOFRAN ODT) 4 MG disintegrating tablet Take 1 tablet (4 mg total) by mouth every 8 (eight) hours as needed. 06/24/18   Gregor Hams, MD    Allergies Molds & smuts, Other, Penicillins, Pioglitazone, Byetta 10 mcg pen [exenatide], Dapagliflozin, Jardiance [empagliflozin], Victoza [liraglutide], Sulfa antibiotics, and Sulfasalazine  Family History  Problem Relation Age of Onset  . Diabetes Mother   . Diabetes Father   . Diabetes Sister   . Diabetes Maternal Grandmother   . Diabetes Paternal Grandmother     Social History Social History   Tobacco Use  . Smoking status: Former Smoker    Packs/day: 1.00    Years: 52.00    Pack years: 52.00    Types: Cigarettes    Quit date: 06/12/2018    Years since quitting: 1.4  . Smokeless tobacco: Never Used  Vaping Use  . Vaping Use: Never used  Substance Use Topics  . Alcohol use: No    Alcohol/week: 0.0 standard drinks  . Drug use:  Not on file    Review of Systems  Constitutional: No fever/chills Eyes: No visual changes. ENT: No sore throat. Respiratory: Denies cough Genitourinary: Negative for dysuria. Musculoskeletal: Negative for back pain. Skin: Negative for rash.  Positive for tick bite Psychiatric: no mood changes,     ____________________________________________   PHYSICAL EXAM:  VITAL SIGNS: ED Triage Vitals  Enc Vitals Group     BP 11/26/19 1555 (!) 181/82     Pulse Rate 11/26/19 1555 (!) 102     Resp 11/26/19 1555 18     Temp 11/26/19 1556 98.1 F (36.7 C)     Temp Source 11/26/19 1556 Oral     SpO2 11/26/19 1555 95 %     Weight 11/26/19 1553 175 lb (79.4 kg)     Height 11/26/19 1553 4\' 11"  (1.499 m)     Head Circumference --      Peak Flow --      Pain Score 11/26/19 1553 6     Pain Loc --      Pain Edu? --      Excl. in [place]? --     Constitutional: Alert and oriented. Well appearing and in no acute distress. Eyes: Conjunctivae are normal.  Head: Atraumatic. Nose: No  congestion/rhinnorhea. Mouth/Throat: Mucous membranes are moist.   Neck:  supple no lymphadenopathy noted Cardiovascular: Normal rate, regular rhythm.  Respiratory: Normal respiratory effort.  No retractions,  GU: deferred Musculoskeletal: FROM all extremities, warm and well perfused Neurologic:  Normal speech and language.  Skin:  Skin is warm, dry and intact.  Tick noted to be embedded on the left side of the neck. Psychiatric: Mood and affect are normal. Speech and behavior are normal.  ____________________________________________   LABS (all labs ordered are listed, but only abnormal results are displayed)  Labs Reviewed - No data to display ____________________________________________   ____________________________________________  RADIOLOGY    ____________________________________________   PROCEDURES  Procedure(s) performed:   .Foreign Body Removal  Date/Time: 11/26/2019 5:04 PM Performed by: Versie Starks, PA-C Authorized by: Versie Starks, PA-C  Body area: skin General location: head/neck Location details: neck  Anesthesia: Local Anesthetic: LET (lido,epi,tetracaine)  Sedation: Patient sedated: no  Patient restrained: no Complexity: simple 1 objects recovered. Objects recovered: tick Post-procedure assessment: foreign body removed      ____________________________________________   INITIAL IMPRESSION / ASSESSMENT AND PLAN / ED COURSE  Pertinent labs & imaging results that were available during my care of the patient were reviewed by me and considered in my medical decision making (see chart for details).   Patient is a 83-year-old female presents emergency department with a tick being embedded in her neck for 1 week.  See HPI physical exam does show a swollen area with a tick embedded in the left side of the neck.  See procedure note for tick removal.  Due to the area being red and swollen I did place patient on doxycycline 1 twice daily  for 14 days.  She is to follow-up with her regular doctor if not improving in 3 days.  Apply warm compress to the area.  Return if worsening.  Take Tylenol for pain if needed.  She is discharged stable condition.      As part of my medical decision making, I reviewed the following data within the [place] History obtained from family, Nursing notes reviewed and incorporated, Old chart reviewed, Notes from prior ED visits and [place] Controlled Substance Database  ____________________________________________  FINAL CLINICAL IMPRESSION(S) / ED DIAGNOSES  Final diagnoses:  Tick bite with subsequent removal of tick      NEW MEDICATIONS STARTED DURING THIS VISIT:  Current Discharge Medication List    START taking these medications   Details  doxycycline (VIBRAMYCIN) 100 MG capsule Take 1 capsule (100 mg total) by mouth 2 (two) times daily. Qty: 28 capsule, Refills: 0         Note:  This document was prepared using Dragon voice recognition software and may include unintentional dictation errors.    Versie Starks, PA-C 11/26/19 1706    Carrie Mew, MD 11/26/19 2308

## 2019-11-26 NOTE — ED Triage Notes (Signed)
Pt has tick in L side of neck. A&O, ambulatory.

## 2020-05-08 ENCOUNTER — Other Ambulatory Visit: Payer: Self-pay | Admitting: Internal Medicine

## 2020-05-08 DIAGNOSIS — K7469 Other cirrhosis of liver: Secondary | ICD-10-CM

## 2020-05-24 ENCOUNTER — Ambulatory Visit
Admission: RE | Admit: 2020-05-24 | Discharge: 2020-05-24 | Disposition: A | Discharge status: Home / Self Care | Payer: Medicare Other | Source: Ambulatory Visit | Attending: Internal Medicine | Admitting: Internal Medicine

## 2020-05-24 ENCOUNTER — Other Ambulatory Visit: Payer: Self-pay

## 2020-05-24 DIAGNOSIS — K7469 Other cirrhosis of liver: Secondary | ICD-10-CM | POA: Diagnosis not present

## 2020-10-30 ENCOUNTER — Telehealth: Payer: Self-pay

## 2020-10-30 NOTE — Telephone Encounter (Signed)
Left message for patient to notify them that it is time to schedule annual low dose lung cancer screening CT scan. Instructed patient to call back ([phone]) to verify information and schedule.  

## 2020-11-11 ENCOUNTER — Other Ambulatory Visit: Payer: Self-pay | Admitting: *Deleted

## 2020-11-11 DIAGNOSIS — Z122 Encounter for screening for malignant neoplasm of respiratory organs: Secondary | ICD-10-CM

## 2020-11-11 DIAGNOSIS — Z87891 Personal history of nicotine dependence: Secondary | ICD-10-CM

## 2020-11-11 NOTE — Progress Notes (Signed)
Contacted and scheduled for annual lung screening scan. Patient is a former smoker, quit 06/12/18, 51.5 pack year history.

## 2020-11-12 ENCOUNTER — Inpatient Hospital Stay: Payer: Medicare Other | Attending: Oncology | Admitting: Oncology

## 2020-11-12 ENCOUNTER — Encounter: Payer: Self-pay | Admitting: Oncology

## 2020-11-12 ENCOUNTER — Inpatient Hospital Stay: Payer: Medicare Other

## 2020-11-12 ENCOUNTER — Other Ambulatory Visit: Payer: Self-pay

## 2020-11-12 ENCOUNTER — Encounter (INDEPENDENT_AMBULATORY_CARE_PROVIDER_SITE_OTHER): Payer: Self-pay

## 2020-11-12 VITALS — BP 153/66 | HR 83 | Temp 97.8°F | Resp 18 | Wt 174.1 lb

## 2020-11-12 DIAGNOSIS — D509 Iron deficiency anemia, unspecified: Secondary | ICD-10-CM

## 2020-11-12 DIAGNOSIS — K589 Irritable bowel syndrome without diarrhea: Secondary | ICD-10-CM | POA: Insufficient documentation

## 2020-11-12 DIAGNOSIS — Z7982 Long term (current) use of aspirin: Secondary | ICD-10-CM | POA: Diagnosis not present

## 2020-11-12 DIAGNOSIS — Z79899 Other long term (current) drug therapy: Secondary | ICD-10-CM | POA: Insufficient documentation

## 2020-11-12 DIAGNOSIS — Z794 Long term (current) use of insulin: Secondary | ICD-10-CM | POA: Insufficient documentation

## 2020-11-12 DIAGNOSIS — Z8673 Personal history of transient ischemic attack (TIA), and cerebral infarction without residual deficits: Secondary | ICD-10-CM | POA: Insufficient documentation

## 2020-11-12 DIAGNOSIS — K219 Gastro-esophageal reflux disease without esophagitis: Secondary | ICD-10-CM | POA: Diagnosis not present

## 2020-11-12 DIAGNOSIS — E785 Hyperlipidemia, unspecified: Secondary | ICD-10-CM | POA: Diagnosis not present

## 2020-11-12 DIAGNOSIS — J449 Chronic obstructive pulmonary disease, unspecified: Secondary | ICD-10-CM | POA: Insufficient documentation

## 2020-11-12 DIAGNOSIS — I10 Essential (primary) hypertension: Secondary | ICD-10-CM | POA: Insufficient documentation

## 2020-11-12 DIAGNOSIS — E119 Type 2 diabetes mellitus without complications: Secondary | ICD-10-CM | POA: Insufficient documentation

## 2020-11-12 NOTE — Progress Notes (Signed)
Hematology/Oncology Consult note [facility] [phone] [phone]   Patient Care Team: Adin Hector, MD as PCP - General (Internal Medicine)  REFERRING PROVIDER: Adin Hector, MD CHIEF COMPLAINTS/REASON FOR VISIT:  Evaluation of iron deficiency anemia  HISTORY OF PRESENTING ILLNESS:  Brenda Beasley is a  70 y.o.  female with PMH listed below was seen in consultation at the request of Adin Hector, MD   for evaluation of iron deficiency anemia.   Reviewed patient's recent labs  6/21/202 Labs revealed anemia with hemoglobin of 9.1, MCV 76, normal total wbc and platlet Reviewed patient's previous labs ordered by primary care physician's office, anemia is chronic onset , duration is since 2019. Basline above 10 No aggravating or improving factors.  Associated signs and symptoms: Patient reports fatigue.  Mild SOB with exertion.  Denies weight loss, easy bruising, hematochezia, hemoptysis, hematuria. Context:  History of iron deficiency: long standing history of IDA. Takes oral iron supplementation Rectal bleeding: deneis.  Menstrual bleeding/ Vaginal bleeding : denies Hematemesis or hemoptysis : denies Blood in urine : denies   History partial colectomy about 15 years due to large polyp. Per patient, pathology showed pre-cancer polyp.  Family history is positive for colon cancer in mother and breast cancer in sister.  Accompanied by her son.  She craves for ice chips.    Review of Systems  Constitutional:  Positive for fatigue. Negative for appetite change, chills, fever and unexpected weight change.  HENT:   Negative for hearing loss and voice change.   Eyes:  Negative for eye problems.  Respiratory:  Positive for shortness of breath. Negative for chest tightness and cough.   Cardiovascular:  Negative for chest pain.  Gastrointestinal:  Negative for abdominal distention, abdominal pain and blood in stool.   Endocrine: Negative for hot flashes.  Genitourinary:  Negative for difficulty urinating and frequency.   Musculoskeletal:  Positive for back pain. Negative for arthralgias.  Skin:  Negative for itching and rash.  Neurological:  Negative for extremity weakness.  Hematological:  Negative for adenopathy.  Psychiatric/Behavioral:  Negative for confusion.    MEDICAL HISTORY:  Past Medical History:  Diagnosis Date   Anxiety    Asthma    Chronic pruritus    Cirrhosis of liver (H[facility])    COPD (chronic obstructive pulmonary disease) ([place])    Diabetes mellitus without complication (H[facility])    GERD (gastroesophageal reflux disease)    Hematuria, microscopic    History of uterine fibroid    History of ventral hernia    Hyperlipidemia    Hypertension    IBS (irritable bowel syndrome)    Osteoporosis with current pathological fracture 11/03/2018   Personal history of tobacco use, presenting hazards to health 09/20/2015   Proteinuria    Stroke ([place])    Tobacco abuse 10/04/2014   Vitamin B 12 deficiency    Vitamin D deficiency     SURGICAL HISTORY: Past Surgical History:  Procedure Laterality Date   COLON SURGERY     COLONOSCOPY WITH PROPOFOL N/A 11/23/2018   Procedure: COLONOSCOPY WITH PROPOFOL;  Surgeon: Toledo, Benay Pike, MD;  Location: [facility] ENDOSCOPY;  Service: Gastroenterology;  Laterality: N/A;   ESOPHAGOGASTRODUODENOSCOPY (EGD) WITH PROPOFOL N/A 11/23/2018   Procedure: ESOPHAGOGASTRODUODENOSCOPY (EGD) WITH PROPOFOL;  Surgeon: Toledo, Benay Pike, MD;  Location: [facility] ENDOSCOPY;  Service: Gastroenterology;  Laterality: N/A;   HERNIA REPAIR     PARTIAL COLECTOMY     TONSILLECTOMY  VENTRAL HERNIA REPAIR      SOCIAL HISTORY: Social History   Socioeconomic History   Marital status: Widowed    Spouse name: Not on file   Number of children: Not on file   Years of education: Not on file   Highest education level: Not on file  Occupational History   Not on file  Tobacco Use   Smoking  status: Former    Packs/day: 1.00    Years: 52.00    Pack years: 52.00    Types: Cigarettes    Quit date: 06/12/2018    Years since quitting: 2.4   Smokeless tobacco: Never  Vaping Use   Vaping Use: Never used  Substance and Sexual Activity   Alcohol use: No    Alcohol/week: 0.0 standard drinks   Drug use: Not on file   Sexual activity: Not on file  Other Topics Concern   Not on file  Social History Narrative   Not on file   Social Determinants of Health   Financial Resource Strain: Not on file  Food Insecurity: Not on file  Transportation Needs: Not on file  Physical Activity: Not on file  Stress: Not on file  Social Connections: Not on file  Intimate Partner Violence: Not on file    FAMILY HISTORY: Family History  Problem Relation Age of Onset   Diabetes Mother    Diabetes Father    Diabetes Sister    Diabetes Maternal Grandmother    Diabetes Paternal Grandmother     ALLERGIES:  is allergic to molds & smuts, other, penicillins, pioglitazone, byetta 10 mcg pen [exenatide], dapagliflozin, jardiance [empagliflozin], victoza [liraglutide], sulfa antibiotics, and sulfasalazine.  MEDICATIONS:  Current Outpatient Medications  Medication Sig Dispense Refill   albuterol (VENTOLIN HFA) 108 (90 Base) MCG/ACT inhaler Inhale 2 puffs into the lungs every 6 (six) hours as needed.     amLODipine (NORVASC) 5 MG tablet Take 2.5 mg by mouth daily.      ANORO ELLIPTA 62.5-25 MCG/INH AEPB Take 1 Inhaler by mouth daily.  4   aspirin EC 81 MG tablet Take 81 mg by mouth daily.     CALCIUM PO Take 1 tablet by mouth every evening.     Cholecalciferol (VITAMIN D3) 5000 units TABS Take 5,000 Units by mouth daily.      cyanocobalamin (,VITAMIN B-12,) 1000 MCG/ML injection Inject 1,000 mcg into the muscle every 30 (thirty) days.     Cyanocobalamin-Methylcobalamin 10000 (B12) MCG/2ML LIQD Inject into the muscle every 30 (thirty) days.     ferrous sulfate 325 (65 FE) MG tablet Take 325 mg by  mouth daily.     fluticasone (FLONASE) 50 MCG/ACT nasal spray Place 2 sprays into the nose daily.     gabapentin (NEURONTIN) 100 MG capsule Take 100 mg by mouth at bedtime as needed.     glipiZIDE (GLUCOTROL) 10 MG tablet Take 20 mg 2 (two) times daily by mouth.      hydrochlorothiazide (HYDRODIURIL) 25 MG tablet Take 12.5 mg by mouth daily.      HYDROcodone-homatropine (HYCODAN) 5-1.5 MG/5ML syrup Take 5 mLs by mouth every 6 (six) hours as needed for cough. 120 mL 0   Insulin Glargine, 2 Unit Dial, (TOUJEO MAX SOLOSTAR) 300 UNIT/ML SOPN Inject 300 Units/mL into the skin at bedtime.     ipratropium-albuterol (DUONEB) 0.5-2.5 (3) MG/3ML SOLN Take 3 mLs 4 (four) times daily as needed by nebulization.     lisinopril (PRINIVIL,ZESTRIL) 40 MG tablet Take 20 mg by mouth  daily.      loperamide (IMODIUM) 2 MG capsule Take 1 capsule (2 mg total) every 6 (six) hours as needed by mouth for diarrhea or loose stools. 30 capsule 0   lovastatin (MEVACOR) 40 MG tablet Take 2 tablets by mouth daily.     MAGNESIUM PO Take 1 tablet by mouth at bedtime.      metFORMIN (GLUCOPHAGE) 1000 MG tablet Take 1,000 mg by mouth 2 (two) times daily.     naproxen (NAPROSYN) 375 MG tablet Take 1 tablet (375 mg total) by mouth 2 (two) times daily with a meal. 14 tablet 0   omeprazole (PRILOSEC) 20 MG capsule Take 20 mg by mouth 2 (two) times daily.     ondansetron (ZOFRAN ODT) 4 MG disintegrating tablet Take 1 tablet (4 mg total) by mouth every 8 (eight) hours as needed. 20 tablet 0   vitamin C (ASCORBIC ACID) 250 MG tablet Take 250 mg by mouth daily.     doxycycline (VIBRAMYCIN) 100 MG capsule Take 1 capsule (100 mg total) by mouth 2 (two) times daily. (Patient not taking: Reported on 11/12/2020) 28 capsule 0   No current facility-administered medications for this visit.     PHYSICAL EXAMINATION: ECOG PERFORMANCE STATUS: 1 - Symptomatic but completely ambulatory Vitals:   11/12/20 1420  BP: (!) 153/66  Pulse: 83  Resp:  18  Temp: 97.8 F (36.6 C)   Filed Weights   11/12/20 1420  Weight: 174 lb 1.6 oz (79 kg)    Physical Exam Constitutional:      General: She is not in acute distress.    Appearance: She is obese.     Comments: She walks independently. Need assistance to get on examination table.   HENT:     Head: Normocephalic and atraumatic.  Eyes:     General: No scleral icterus. Cardiovascular:     Rate and Rhythm: Normal rate and regular rhythm.     Heart sounds: Normal heart sounds.  Pulmonary:     Effort: Pulmonary effort is normal. No respiratory distress.     Breath sounds: No wheezing.  Abdominal:     General: Bowel sounds are normal. There is no distension.     Palpations: Abdomen is soft.  Musculoskeletal:        General: No deformity. Normal range of motion.     Cervical back: Normal range of motion and neck supple.  Skin:    General: Skin is warm and dry.     Findings: No erythema or rash.  Neurological:     Mental Status: She is alert and oriented to person, place, and time. Mental status is at baseline.     Cranial Nerves: No cranial nerve deficit.     Coordination: Coordination normal.  Psychiatric:        Mood and Affect: Mood normal.      CMP Latest Ref Rng & Units 06/23/2018  Glucose 70 - 99 mg/dL 160(H)  BUN 8 - 23 mg/dL 20  Creatinine 0.44 - 1.00 mg/dL 0.97  Sodium 135 - 145 mmol/L 136  Potassium 3.5 - 5.1 mmol/L 4.2  Chloride 98 - 111 mmol/L 98  CO2 22 - 32 mmol/L 27  Calcium 8.9 - 10.3 mg/dL 9.3  Total Protein 6.5 - 8.1 g/dL 7.1  Total Bilirubin 0.3 - 1.2 mg/dL 0.8  Alkaline Phos 38 - 126 U/L 61  AST 15 - 41 U/L 30  ALT 0 - 44 U/L 23   CBC Latest Ref Rng &  Units 06/23/2018  WBC 4.0 - 10.5 K/uL 11.3(H)  Hemoglobin 12.0 - 15.0 g/dL 11.5(L)  Hematocrit 36.0 - 46.0 % 36.6  Platelets 150 - 400 K/uL 209     LABORATORY DATA:  I have reviewed the data as listed Lab Results  Component Value Date   WBC 11.3 (H) 06/23/2018   HGB 11.5 (L) 06/23/2018   HCT  36.6 06/23/2018   MCV 81.9 06/23/2018   PLT 209 06/23/2018   No results for input(s): NA, K, CL, CO2, GLUCOSE, BUN, CREATININE, CALCIUM, GFRNONAA, GFRAA, PROT, ALBUMIN, AST, ALT, ALKPHOS, BILITOT, BILIDIR, IBILI in the last 8760 hours. Iron/TIBC/Ferritin/ %Sat No results found for: IRON, TIBC, FERRITIN, IRONPCTSAT   RADIOGRAPHIC STUDIES: I have personally reviewed the radiological images as listed and agreed with the findings in the report. No results found.     ASSESSMENT & PLAN:  1. Iron deficiency anemia, unspecified iron deficiency anemia type    Her Labs from primary care provider's office are reviewed and discussed with patient. Consistent with iron deficiency anemia. Plan IV iron with Venofer 200mg  twice weekly x 4 doses. Allergy reactions/infusion reaction including anaphylactic reaction discussed with patient. Other side effects include but not limited to high blood pressure, skin rash, weight gain, leg swelling, etc. Patient voices understanding and willing to proceed.  Orders Placed This Encounter  Procedures   CBC with Differential/Platelet    Standing Status:   Future    Standing Expiration Date:   11/12/2021   Ferritin    Standing Status:   Future    Standing Expiration Date:   11/12/2021   Iron and TIBC    Standing Status:   Future    Standing Expiration Date:   11/12/2021    All questions were answered. The patient knows to call the clinic with any problems questions or concerns.  Cc Adin Hector, MD  Return of visit: 6 weeks Thank you for this kind referral and the opportunity to participate in the care of this patient. A copy of today's note is routed to referring provider   Earlie Server, MD, PhD Hematology Oncology [place] [facility] 11/12/2020

## 2020-11-13 ENCOUNTER — Other Ambulatory Visit: Payer: Self-pay | Admitting: Oncology

## 2020-11-13 ENCOUNTER — Encounter: Payer: Self-pay | Admitting: Oncology

## 2020-11-13 DIAGNOSIS — D509 Iron deficiency anemia, unspecified: Secondary | ICD-10-CM

## 2020-11-13 DIAGNOSIS — D508 Other iron deficiency anemias: Secondary | ICD-10-CM

## 2020-11-13 HISTORY — DX: Iron deficiency anemia, unspecified: D50.9

## 2020-11-14 ENCOUNTER — Other Ambulatory Visit: Payer: Self-pay

## 2020-11-14 ENCOUNTER — Inpatient Hospital Stay: Payer: Medicare Other

## 2020-11-14 VITALS — BP 154/67 | HR 88 | Temp 96.6°F | Resp 18

## 2020-11-14 DIAGNOSIS — D509 Iron deficiency anemia, unspecified: Secondary | ICD-10-CM | POA: Diagnosis not present

## 2020-11-14 DIAGNOSIS — D508 Other iron deficiency anemias: Secondary | ICD-10-CM

## 2020-11-14 MED ORDER — IRON SUCROSE 20 MG/ML IV SOLN
200.0000 mg | Freq: Once | INTRAVENOUS | Status: AC
  Administered 2020-11-14: 200 mg via INTRAVENOUS
  Filled 2020-11-14: qty 10

## 2020-11-14 MED ORDER — SODIUM CHLORIDE 0.9 % IV SOLN: 200.0000 mg | Freq: Once | INTRAVENOUS | Status: DC

## 2020-11-14 MED ORDER — SODIUM CHLORIDE 0.9 % IV SOLN
Freq: Once | INTRAVENOUS | Status: AC
  Filled 2020-11-14: qty 250

## 2020-11-14 NOTE — Patient Instructions (Signed)

## 2020-11-15 ENCOUNTER — Ambulatory Visit
Admission: RE | Admit: 2020-11-15 | Discharge: 2020-11-15 | Disposition: A | Discharge status: Home / Self Care | Payer: Medicare Other | Source: Ambulatory Visit | Attending: Nurse Practitioner | Admitting: Nurse Practitioner

## 2020-11-15 ENCOUNTER — Other Ambulatory Visit: Payer: Self-pay

## 2020-11-15 DIAGNOSIS — Z87891 Personal history of nicotine dependence: Secondary | ICD-10-CM | POA: Insufficient documentation

## 2020-11-15 DIAGNOSIS — Z122 Encounter for screening for malignant neoplasm of respiratory organs: Secondary | ICD-10-CM | POA: Diagnosis present

## 2020-11-20 ENCOUNTER — Inpatient Hospital Stay: Payer: Medicare Other | Attending: Oncology

## 2020-11-20 ENCOUNTER — Other Ambulatory Visit: Payer: Self-pay

## 2020-11-20 VITALS — BP 133/68 | HR 88 | Temp 96.6°F | Resp 19

## 2020-11-20 DIAGNOSIS — D509 Iron deficiency anemia, unspecified: Secondary | ICD-10-CM | POA: Diagnosis present

## 2020-11-20 DIAGNOSIS — D508 Other iron deficiency anemias: Secondary | ICD-10-CM

## 2020-11-20 MED ORDER — SODIUM CHLORIDE 0.9 % IV SOLN
Freq: Once | INTRAVENOUS | Status: AC
  Filled 2020-11-20: qty 250

## 2020-11-20 MED ORDER — SODIUM CHLORIDE 0.9 % IV SOLN: 200.0000 mg | Freq: Once | INTRAVENOUS | Status: DC

## 2020-11-20 MED ORDER — IRON SUCROSE 20 MG/ML IV SOLN
200.0000 mg | Freq: Once | INTRAVENOUS | Status: AC
  Administered 2020-11-20: 200 mg via INTRAVENOUS
  Filled 2020-11-20: qty 10

## 2020-11-20 NOTE — Patient Instructions (Addendum)
[facility]  Discharge Instructions: Thank you for choosing [facility] to provide your oncology and hematology care.  If you have a lab appointment with the [facility], please go directly to the [facility] and check in at the registration area.  Wear comfortable clothing and clothing appropriate for easy access to any Portacath or PICC line.   We strive to give you quality time with your provider. You may need to reschedule your appointment if you arrive late (15 or more minutes).  Arriving late affects you and other patients whose appointments are after yours.  Also, if you miss three or more appointments without notifying the office, you may be dismissed from the clinic at the provider's discretion.      For prescription refill requests, have your pharmacy contact our office and allow 72 hours for refills to be completed.    Today you received the following chemotherapy and/or immunotherapy agents Venofer      To help prevent nausea and vomiting after your treatment, we encourage you to take your nausea medication as directed.  BELOW ARE SYMPTOMS THAT SHOULD BE REPORTED IMMEDIATELY: *FEVER GREATER THAN 100.4 F (38 C) OR HIGHER *CHILLS OR SWEATING *NAUSEA AND VOMITING THAT IS NOT CONTROLLED WITH YOUR NAUSEA MEDICATION *UNUSUAL SHORTNESS OF BREATH *UNUSUAL BRUISING OR BLEEDING *URINARY PROBLEMS (pain or burning when urinating, or frequent urination) *BOWEL PROBLEMS (unusual diarrhea, constipation, pain near the anus) TENDERNESS IN MOUTH AND THROAT WITH OR WITHOUT PRESENCE OF ULCERS (sore throat, sores in mouth, or a toothache) UNUSUAL RASH, SWELLING OR PAIN  UNUSUAL VAGINAL DISCHARGE OR ITCHING   Items with * indicate a potential emergency and should be followed up as soon as possible or go to the Emergency Department if any problems should occur.  Please show the CHEMOTHERAPY ALERT CARD or IMMUNOTHERAPY ALERT CARD at check-in to  the Emergency Department and triage nurse.  Should you have questions after your visit or need to cancel or reschedule your appointment, please contact [facility]  [phone] and follow the prompts.  Office hours are 8:00 a.m. to 4:30 p.m. Monday - Friday. Please note that voicemails left after 4:00 p.m. may not be returned until the following business day.  We are closed weekends and major holidays. You have access to a nurse at all times for urgent questions. Please call the main number to the clinic [phone] and follow the prompts.  For any non-urgent questions, you may also contact your provider using MyChart. We now offer e-Visits for anyone 58 and older to request care online for non-urgent symptoms. For details visit [email or web address].   Also download the MyChart app! Go to the app store, search "MyChart", open the app, select [facility], and log in with your MyChart username and password.  Due to Covid, a mask is required upon entering the hospital/clinic. If you do not have a mask, one will be given to you upon arrival. For doctor visits, patients may have 1 support person aged 43 or older with them. For treatment visits, patients cannot have anyone with them due to current Covid guidelines and our immunocompromised population. Iron Sucrose injection What is this medication? IRON SUCROSE (AHY ern SOO krohs) is an iron complex. Iron is used to make healthy red blood cells, which carry oxygen and nutrients throughout the body. This medicine is used to treat iron deficiency anemia in people with chronickidney disease. This medicine may be used for other purposes; ask  your health care provider orpharmacist if you have questions. COMMON BRAND NAME(S): Venofer What should I tell my care team before I take this medication? They need to know if you have any of these conditions: anemia not caused by low iron levels heart disease high levels of iron  in the blood kidney disease liver disease an unusual or allergic reaction to iron, other medicines, foods, dyes, or preservatives pregnant or trying to get pregnant breast-feeding How should I use this medication? This medicine is for infusion into a vein. It is given by a health careprofessional in a hospital or clinic setting. Talk to your pediatrician regarding the use of this medicine in children. While this drug may be prescribed for children as young as 2 years for selectedconditions, precautions do apply. Overdosage: If you think you have taken too much of this medicine contact apoison control center or emergency room at once. NOTE: This medicine is only for you. Do not share this medicine with others. What if I miss a dose? It is important not to miss your dose. Call your doctor or health careprofessional if you are unable to keep an appointment. What may interact with this medication? Do not take this medicine with any of the following medications: deferoxamine dimercaprol other iron products This medicine may also interact with the following medications: chloramphenicol deferasirox This list may not describe all possible interactions. Give your health care provider a list of all the medicines, herbs, non-prescription drugs, or dietary supplements you use. Also tell them if you smoke, drink alcohol, or use illegaldrugs. Some items may interact with your medicine. What should I watch for while using this medication? Visit your doctor or healthcare professional regularly. Tell your doctor or healthcare professional if your symptoms do not start to get better or if theyget worse. You may need blood work done while you are taking this medicine. You may need to follow a special diet. Talk to your doctor. Foods that contain iron include: whole grains/cereals, dried fruits, beans, or peas, leafy greenvegetables, and organ meats (liver, kidney). What side effects may I notice from receiving  this medication? Side effects that you should report to your doctor or health care professionalas soon as possible: allergic reactions like skin rash, itching or hives, swelling of the face, lips, or tongue breathing problems changes in blood pressure cough fast, irregular heartbeat feeling faint or lightheaded, falls fever or chills flushing, sweating, or hot feelings joint or muscle aches/pains seizures swelling of the ankles or feet unusually weak or tired Side effects that usually do not require medical attention (report to yourdoctor or health care professional if they continue or are bothersome): diarrhea feeling achy headache irritation at site where injected nausea, vomiting stomach upset tiredness This list may not describe all possible side effects. Call your doctor for medical advice about side effects. You may report side effects to FDA at1-800-FDA-1088. Where should I keep my medication? This drug is given in a hospital or clinic and will not be stored at home. NOTE: This sheet is a summary. It may not cover all possible information. If you have questions about this medicine, talk to your doctor, pharmacist, orhealth care provider.  2022 Elsevier/Gold Standard (2011-02-12 17:14:35)

## 2020-11-22 ENCOUNTER — Inpatient Hospital Stay: Payer: Medicare Other

## 2020-11-22 VITALS — BP 141/69 | HR 84 | Temp 97.2°F | Resp 20

## 2020-11-22 DIAGNOSIS — D508 Other iron deficiency anemias: Secondary | ICD-10-CM

## 2020-11-22 DIAGNOSIS — D509 Iron deficiency anemia, unspecified: Secondary | ICD-10-CM | POA: Diagnosis not present

## 2020-11-22 MED ORDER — SODIUM CHLORIDE 0.9 % IV SOLN
Freq: Once | INTRAVENOUS | Status: AC
Start: 2020-11-22 — End: 2020-11-22
  Filled 2020-11-22: qty 250

## 2020-11-22 MED ORDER — IRON SUCROSE 20 MG/ML IV SOLN
200.0000 mg | Freq: Once | INTRAVENOUS | Status: AC
Start: 2020-11-22 — End: 2020-11-22
  Administered 2020-11-22: 200 mg via INTRAVENOUS
  Filled 2020-11-22: qty 10

## 2020-11-22 MED ORDER — SODIUM CHLORIDE 0.9 % IV SOLN: 200.0000 mg | Freq: Once | INTRAVENOUS | Status: DC

## 2020-11-25 ENCOUNTER — Telehealth: Payer: Self-pay | Admitting: *Deleted

## 2020-11-25 DIAGNOSIS — Z87891 Personal history of nicotine dependence: Secondary | ICD-10-CM

## 2020-11-25 DIAGNOSIS — R918 Other nonspecific abnormal finding of lung field: Secondary | ICD-10-CM

## 2020-11-25 NOTE — Telephone Encounter (Signed)
Notified patient of LDCT lung cancer screening program results with recommendation for 3 month follow up imaging. Also notified of incidental findings noted below and is encouraged to discuss further with PCP who will receive a copy of this note and/or the CT report. Patient verbalizes understanding.    IMPRESSION: 1. Lung-RADS 4A, suspicious. Follow up low-dose chest CT without contrast in 3 months (please use the following order, "CT CHEST LCS NODULE FOLLOW-UP W/O CM") is recommended. Alternatively, PET may be considered when there is a solid component 21mm or larger. 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 Measurements taken on 2D imaging today with apparent enlargement of an anterior right middle lobe pulmonary nodule and while tiny at 4 mm, appears progressive in the interval. As such, short interval follow-up screening CT recommended. 2. Nodular hepatic contour compatible with cirrhosis. 3.  Emphysema (ICD10-J43.9) and Aortic Atherosclerosis (ICD10-170.0)  Patient is scheduled for LCS nodule follow up scan, Wednesday, October 5th at 1115am.

## 2020-11-26 ENCOUNTER — Inpatient Hospital Stay: Payer: Medicare Other

## 2020-11-26 VITALS — BP 166/75

## 2020-11-26 DIAGNOSIS — D508 Other iron deficiency anemias: Secondary | ICD-10-CM

## 2020-11-26 DIAGNOSIS — D509 Iron deficiency anemia, unspecified: Secondary | ICD-10-CM | POA: Diagnosis not present

## 2020-11-26 MED ORDER — SODIUM CHLORIDE 0.9 % IV SOLN
Freq: Once | INTRAVENOUS | Status: AC
  Filled 2020-11-26: qty 250

## 2020-11-26 MED ORDER — SODIUM CHLORIDE 0.9 % IV SOLN: 200.0000 mg | Freq: Once | INTRAVENOUS | Status: DC

## 2020-11-26 MED ORDER — IRON SUCROSE 20 MG/ML IV SOLN
200.0000 mg | Freq: Once | INTRAVENOUS | Status: AC
Start: 2020-11-26 — End: 2020-11-26
  Administered 2020-11-26: 200 mg via INTRAVENOUS
  Filled 2020-11-26: qty 10

## 2020-11-26 NOTE — Patient Instructions (Signed)
[facility]  Discharge Instructions: Thank you for choosing [facility] to provide your oncology and hematology care.  If you have a lab appointment with the Cancer Center, please go directly to the Cancer Center and check in at the registration area.  Wear comfortable clothing and clothing appropriate for easy access to any Portacath or PICC line.   We strive to give you quality time with your provider. You may need to reschedule your appointment if you arrive late (15 or more minutes).  Arriving late affects you and other patients whose appointments are after yours.  Also, if you miss three or more appointments without notifying the office, you may be dismissed from the clinic at the provider's discretion.      For prescription refill requests, have your pharmacy contact our office and allow 72 hours for refills to be completed.    Today you received the following : Venofer   To help prevent nausea and vomiting after your treatment, we encourage you to take your nausea medication as directed.  BELOW ARE SYMPTOMS THAT SHOULD BE REPORTED IMMEDIATELY: . *FEVER GREATER THAN 100.4 F (38 C) OR HIGHER . *CHILLS OR SWEATING . *NAUSEA AND VOMITING THAT IS NOT CONTROLLED WITH YOUR NAUSEA MEDICATION . *UNUSUAL SHORTNESS OF BREATH . *UNUSUAL BRUISING OR BLEEDING . *URINARY PROBLEMS (pain or burning when urinating, or frequent urination) . *BOWEL PROBLEMS (unusual diarrhea, constipation, pain near the anus) . TENDERNESS IN MOUTH AND THROAT WITH OR WITHOUT PRESENCE OF ULCERS (sore throat, sores in mouth, or a toothache) . UNUSUAL RASH, SWELLING OR PAIN  . UNUSUAL VAGINAL DISCHARGE OR ITCHING   Items with * indicate a potential emergency and should be followed up as soon as possible or go to the Emergency Department if any problems should occur.  Please show the CHEMOTHERAPY ALERT CARD or IMMUNOTHERAPY ALERT CARD at check-in to the Emergency  Department and triage nurse.  Should you have questions after your visit or need to cancel or reschedule your appointment, please contact [facility]  [phone] and follow the prompts.  Office hours are 8:00 a.m. to 4:30 p.m. Monday - Friday. Please note that voicemails left after 4:00 p.m. may not be returned until the following business day.  We are closed weekends and major holidays. You have access to a nurse at all times for urgent questions. Please call the main number to the clinic [phone] and follow the prompts.  For any non-urgent questions, you may also contact your provider using MyChart. We now offer e-Visits for anyone 18 and older to request care online for non-urgent symptoms. For details visit [email or web address].   Also download the MyChart app! Go to the app store, search "MyChart", open the app, select [facility], and log in with your MyChart username and password.  Due to Covid, a mask is required upon entering the hospital/clinic. If you do not have a mask, one will be given to you upon arrival. For doctor visits, patients may have 1 support person aged 18 or older with them. For treatment visits, patients cannot have anyone with them due to current Covid guidelines and our immunocompromised population.  

## 2020-12-24 ENCOUNTER — Inpatient Hospital Stay: Payer: Medicare Other | Attending: Oncology

## 2020-12-24 ENCOUNTER — Other Ambulatory Visit: Payer: Self-pay

## 2020-12-24 DIAGNOSIS — D509 Iron deficiency anemia, unspecified: Secondary | ICD-10-CM | POA: Diagnosis present

## 2020-12-24 LAB — CBC WITH DIFFERENTIAL/PLATELET
Abs Immature Granulocytes: 0.02 10*3/uL (ref 0.00–0.07)
Basophils Absolute: 0 10*3/uL (ref 0.0–0.1)
Basophils Relative: 1 %
Eosinophils Absolute: 0.1 10*3/uL (ref 0.0–0.5)
Eosinophils Relative: 2 %
HCT: 33.7 % — ABNORMAL LOW (ref 36.0–46.0)
Hemoglobin: 10.6 g/dL — ABNORMAL LOW (ref 12.0–15.0)
Immature Granulocytes: 0 %
Lymphocytes Relative: 18 %
Lymphs Abs: 1 10*3/uL (ref 0.7–4.0)
MCH: 26.2 pg (ref 26.0–34.0)
MCHC: 31.5 g/dL (ref 30.0–36.0)
MCV: 83.4 fL (ref 80.0–100.0)
Monocytes Absolute: 0.6 10*3/uL (ref 0.1–1.0)
Monocytes Relative: 10 %
Neutro Abs: 3.8 10*3/uL (ref 1.7–7.7)
Neutrophils Relative %: 69 %
Platelets: 144 10*3/uL — ABNORMAL LOW (ref 150–400)
RBC: 4.04 MIL/uL (ref 3.87–5.11)
RDW: 18.6 % — ABNORMAL HIGH (ref 11.5–15.5)
WBC: 5.6 10*3/uL (ref 4.0–10.5)
nRBC: 0 % (ref 0.0–0.2)

## 2020-12-24 LAB — IRON AND TIBC
Iron: 37 ug/dL (ref 28–170)
Saturation Ratios: 8 % — ABNORMAL LOW (ref 10.4–31.8)
TIBC: 465 ug/dL — ABNORMAL HIGH (ref 250–450)
UIBC: 428 ug/dL

## 2020-12-24 LAB — FERRITIN: Ferritin: 48 ng/mL (ref 11–307)

## 2020-12-27 ENCOUNTER — Inpatient Hospital Stay: Payer: Medicare Other

## 2020-12-27 ENCOUNTER — Inpatient Hospital Stay (HOSPITAL_BASED_OUTPATIENT_CLINIC_OR_DEPARTMENT_OTHER): Payer: Medicare Other | Admitting: Oncology

## 2020-12-27 ENCOUNTER — Encounter: Payer: Self-pay | Admitting: Oncology

## 2020-12-27 VITALS — BP 137/63 | HR 80 | Temp 97.7°F | Resp 18 | Wt 168.9 lb

## 2020-12-27 DIAGNOSIS — D696 Thrombocytopenia, unspecified: Secondary | ICD-10-CM

## 2020-12-27 DIAGNOSIS — Z87891 Personal history of nicotine dependence: Secondary | ICD-10-CM

## 2020-12-27 DIAGNOSIS — D509 Iron deficiency anemia, unspecified: Secondary | ICD-10-CM | POA: Diagnosis not present

## 2020-12-27 DIAGNOSIS — D508 Other iron deficiency anemias: Secondary | ICD-10-CM

## 2020-12-27 MED ORDER — IRON SUCROSE 20 MG/ML IV SOLN
200.0000 mg | Freq: Once | INTRAVENOUS | Status: AC
  Administered 2020-12-27: 200 mg via INTRAVENOUS
  Filled 2020-12-27: qty 10

## 2020-12-27 MED ORDER — SODIUM CHLORIDE 0.9 % IV SOLN
Freq: Once | INTRAVENOUS | Status: AC
  Filled 2020-12-27: qty 250

## 2020-12-27 MED ORDER — SODIUM CHLORIDE 0.9 % IV SOLN: 200.0000 mg | Freq: Once | INTRAVENOUS | Status: DC

## 2020-12-27 NOTE — Patient Instructions (Signed)

## 2020-12-28 ENCOUNTER — Encounter: Payer: Self-pay | Admitting: Oncology

## 2020-12-28 NOTE — Progress Notes (Signed)
Hematology/Oncology Consult note [facility] Telephone:([phone] [phone]   Patient Care Team: Adin Hector, MD as PCP - General (Internal Medicine)  REFERRING PROVIDER: Adin Hector, MD CHIEF COMPLAINTS/REASON FOR VISIT:  Evaluation of iron deficiency anemia  HISTORY OF PRESENTING ILLNESS:  Brenda Beasley is a  70 y.o.  female with PMH listed below was seen in consultation at the request of Adin Hector, MD   for evaluation of iron deficiency anemia.   Reviewed patient's recent labs  6/21/202 Labs revealed anemia with hemoglobin of 9.1, MCV 76, normal total wbc and platlet Reviewed patient's previous labs ordered by primary care physician's office, anemia is chronic onset , duration is since 2019. Basline above 10 No aggravating or improving factors.  Associated signs and symptoms: Patient reports fatigue.  Mild SOB with exertion.  Denies weight loss, easy bruising, hematochezia, hemoptysis, hematuria. Context:  History of iron deficiency: long standing history of IDA. Takes oral iron supplementation Rectal bleeding: deneis.  Menstrual bleeding/ Vaginal bleeding : denies Hematemesis or hemoptysis : denies Blood in urine : denies   History partial colectomy about 15 years due to large polyp. Per patient, pathology showed pre-cancer polyp.  Family history is positive for colon cancer in mother and breast cancer in sister.  Accompanied by her son.  She craves for ice chips.    INTERVAL HISTORY Brenda Beasley is a 70 y.o. female who has above history reviewed by me today presents for follow up visit for  iron deficiency anemia.  Patient has received IV venofer treatments, tolerated well.  Fatigue has improved some.      Review of Systems  Constitutional:  Positive for fatigue. Negative for appetite change, chills, fever and unexpected weight change.  HENT:   Negative for hearing loss and voice change.   Eyes:   Negative for eye problems.  Respiratory:  Negative for chest tightness, cough and shortness of breath.   Cardiovascular:  Negative for chest pain.  Gastrointestinal:  Negative for abdominal distention, abdominal pain and blood in stool.  Endocrine: Negative for hot flashes.  Genitourinary:  Negative for difficulty urinating and frequency.   Musculoskeletal:  Positive for back pain. Negative for arthralgias.  Skin:  Negative for itching and rash.  Neurological:  Negative for extremity weakness.  Hematological:  Negative for adenopathy.  Psychiatric/Behavioral:  Negative for confusion.    MEDICAL HISTORY:  Past Medical History:  Diagnosis Date   Anxiety    Asthma    Chronic pruritus    Cirrhosis of liver (HCC)    COPD (chronic obstructive pulmonary disease) ([place])    Diabetes mellitus without complication (HCC)    GERD (gastroesophageal reflux disease)    Hematuria, microscopic    History of uterine fibroid    History of ventral hernia    Hyperlipidemia    Hypertension    IBS (irritable bowel syndrome)    IDA (iron deficiency anemia) 11/13/2020   Osteoporosis with current pathological fracture 11/03/2018   Personal history of tobacco use, presenting hazards to health 09/20/2015   Proteinuria    Stroke ([place])    Tobacco abuse 10/04/2014   Vitamin B 12 deficiency    Vitamin D deficiency     SURGICAL HISTORY: Past Surgical History:  Procedure Laterality Date   COLON SURGERY     COLONOSCOPY WITH PROPOFOL N/A 11/23/2018   Procedure: COLONOSCOPY WITH PROPOFOL;  Surgeon: Toledo, Benay Pike, MD;  Location: [facility] ENDOSCOPY;  Service: Gastroenterology;  Laterality: N/A;   ESOPHAGOGASTRODUODENOSCOPY (EGD) WITH PROPOFOL N/A 11/23/2018   Procedure: ESOPHAGOGASTRODUODENOSCOPY (EGD) WITH PROPOFOL;  Surgeon: Toledo, Benay Pike, MD;  Location: [facility] ENDOSCOPY;  Service: Gastroenterology;  Laterality: N/A;   HERNIA REPAIR     PARTIAL COLECTOMY     TONSILLECTOMY     VENTRAL HERNIA REPAIR      SOCIAL  HISTORY: Social History   Socioeconomic History   Marital status: Widowed    Spouse name: Not on file   Number of children: Not on file   Years of education: Not on file   Highest education level: Not on file  Occupational History   Not on file  Tobacco Use   Smoking status: Former    Packs/day: 1.00    Years: 52.00    Pack years: 52.00    Types: Cigarettes    Quit date: 06/12/2018    Years since quitting: 2.5   Smokeless tobacco: Never  Vaping Use   Vaping Use: Never used  Substance and Sexual Activity   Alcohol use: No    Alcohol/week: 0.0 standard drinks   Drug use: Not on file   Sexual activity: Not on file  Other Topics Concern   Not on file  Social History Narrative   Not on file   Social Determinants of Health   Financial Resource Strain: Not on file  Food Insecurity: Not on file  Transportation Needs: Not on file  Physical Activity: Not on file  Stress: Not on file  Social Connections: Not on file  Intimate Partner Violence: Not on file    FAMILY HISTORY: Family History  Problem Relation Age of Onset   Diabetes Mother    Diabetes Father    Diabetes Sister    Diabetes Maternal Grandmother    Diabetes Paternal Grandmother     ALLERGIES:  is allergic to molds & smuts, other, penicillins, pioglitazone, byetta 10 mcg pen [exenatide], dapagliflozin, jardiance [empagliflozin], victoza [liraglutide], sulfa antibiotics, and sulfasalazine.  MEDICATIONS:  Current Outpatient Medications  Medication Sig Dispense Refill   albuterol (VENTOLIN HFA) 108 (90 Base) MCG/ACT inhaler Inhale 2 puffs into the lungs every 6 (six) hours as needed.     amLODipine (NORVASC) 5 MG tablet Take 2.5 mg by mouth daily.      ANORO ELLIPTA 62.5-25 MCG/INH AEPB Take 1 Inhaler by mouth daily.  4   aspirin EC 81 MG tablet Take 81 mg by mouth daily.     CALCIUM PO Take 1 tablet by mouth every evening.     Cholecalciferol (VITAMIN D3) 5000 units TABS Take 5,000 Units by mouth daily.       cyanocobalamin (,VITAMIN B-12,) 1000 MCG/ML injection Inject 1,000 mcg into the muscle every 30 (thirty) days.     Cyanocobalamin-Methylcobalamin 10000 (B12) MCG/2ML LIQD Inject into the muscle every 30 (thirty) days.     ferrous sulfate 325 (65 FE) MG tablet Take 325 mg by mouth daily.     fluticasone (FLONASE) 50 MCG/ACT nasal spray Place 2 sprays into the nose daily.     gabapentin (NEURONTIN) 100 MG capsule Take 100 mg by mouth at bedtime as needed.     glipiZIDE (GLUCOTROL) 10 MG tablet Take 20 mg 2 (two) times daily by mouth.      hydrochlorothiazide (HYDRODIURIL) 25 MG tablet Take 12.5 mg by mouth daily.      HYDROcodone-homatropine (HYCODAN) 5-1.5 MG/5ML syrup Take 5 mLs by mouth every 6 (six) hours as needed for cough. 120 mL 0   Insulin Glargine,  2 Unit Dial, (TOUJEO MAX SOLOSTAR) 300 UNIT/ML SOPN Inject 300 Units/mL into the skin at bedtime.     ipratropium-albuterol (DUONEB) 0.5-2.5 (3) MG/3ML SOLN Take 3 mLs 4 (four) times daily as needed by nebulization.     lisinopril (PRINIVIL,ZESTRIL) 40 MG tablet Take 20 mg by mouth daily.      loperamide (IMODIUM) 2 MG capsule Take 1 capsule (2 mg total) every 6 (six) hours as needed by mouth for diarrhea or loose stools. 30 capsule 0   lovastatin (MEVACOR) 40 MG tablet Take 2 tablets by mouth daily.     MAGNESIUM PO Take 1 tablet by mouth at bedtime.      metFORMIN (GLUCOPHAGE) 1000 MG tablet Take 1,000 mg by mouth 2 (two) times daily.     naproxen (NAPROSYN) 375 MG tablet Take 1 tablet (375 mg total) by mouth 2 (two) times daily with a meal. 14 tablet 0   omeprazole (PRILOSEC) 20 MG capsule Take 20 mg by mouth 2 (two) times daily.     ondansetron (ZOFRAN ODT) 4 MG disintegrating tablet Take 1 tablet (4 mg total) by mouth every 8 (eight) hours as needed. 20 tablet 0   vitamin C (ASCORBIC ACID) 250 MG tablet Take 250 mg by mouth daily.     doxycycline (VIBRAMYCIN) 100 MG capsule Take 1 capsule (100 mg total) by mouth 2 (two) times daily.  (Patient not taking: Reported on 11/12/2020) 28 capsule 0   No current facility-administered medications for this visit.     PHYSICAL EXAMINATION: ECOG PERFORMANCE STATUS: 1 - Symptomatic but completely ambulatory Vitals:   12/27/20 1131  BP: 137/63  Pulse: 80  Resp: 18  Temp: 97.7 F (36.5 C)  SpO2: 95%   Filed Weights   12/27/20 1131  Weight: 168 lb 14.4 oz (76.6 kg)    Physical Exam Constitutional:      General: She is not in acute distress.    Appearance: She is obese.     Comments: She walks independently. Need assistance to get on examination table.   HENT:     Head: Normocephalic and atraumatic.  Eyes:     General: No scleral icterus. Cardiovascular:     Rate and Rhythm: Normal rate and regular rhythm.     Heart sounds: Normal heart sounds.  Pulmonary:     Effort: Pulmonary effort is normal. No respiratory distress.     Breath sounds: No wheezing.  Abdominal:     General: Bowel sounds are normal. There is no distension.     Palpations: Abdomen is soft.  Musculoskeletal:        General: No deformity. Normal range of motion.     Cervical back: Normal range of motion and neck supple.  Skin:    General: Skin is warm and dry.     Findings: No erythema or rash.  Neurological:     Mental Status: She is alert and oriented to person, place, and time. Mental status is at baseline.     Cranial Nerves: No cranial nerve deficit.     Coordination: Coordination normal.  Psychiatric:        Mood and Affect: Mood normal.       CBC Latest Ref Rng & Units 12/24/2020  WBC 4.0 - 10.5 K/uL 5.6  Hemoglobin 12.0 - 15.0 g/dL 10.6(L)  Hematocrit 36.0 - 46.0 % 33.7(L)  Platelets 150 - 400 K/uL 144(L)     LABORATORY DATA:  I have reviewed the data as listed Lab Results  Component  Value Date   WBC 5.6 12/24/2020   HGB 10.6 (L) 12/24/2020   HCT 33.7 (L) 12/24/2020   MCV 83.4 12/24/2020   PLT 144 (L) 12/24/2020   No results for input(s): NA, K, CL, CO2, GLUCOSE, BUN,  CREATININE, CALCIUM, GFRNONAA, GFRAA, PROT, ALBUMIN, AST, ALT, ALKPHOS, BILITOT, BILIDIR, IBILI in the last 8760 hours. Iron/TIBC/Ferritin/ %Sat    Component Value Date/Time   IRON 37 12/24/2020 0851   TIBC 465 (H) 12/24/2020 0851   FERRITIN 48 12/24/2020 0851   IRONPCTSAT 8 (L) 12/24/2020 0851     RADIOGRAPHIC STUDIES: I have personally reviewed the radiological images as listed and agreed with the findings in the report. CT CHEST LUNG CANCER SCREENING LOW DOSE WO CONTRAST  Result Date: 11/19/2020 CLINICAL DATA:  70 year old female with 52 pack-year history of smoking. Lung cancer screening. EXAM: CT CHEST WITHOUT CONTRAST LOW-DOSE FOR LUNG CANCER SCREENING TECHNIQUE: Multidetector CT imaging of the chest was performed following the standard protocol without IV contrast. COMPARISON:  11/01/2019 FINDINGS: Cardiovascular: The heart size is normal. No substantial pericardial effusion. Coronary artery calcification is evident. Atherosclerotic calcification is noted in the wall of the thoracic aorta. Mediastinum/Nodes: No mediastinal lymphadenopathy. No evidence for gross hilar lymphadenopathy although assessment is limited by the lack of intravenous contrast on today's study. The esophagus has normal imaging features. There is no axillary lymphadenopathy. Lungs/Pleura: Centrilobular and paraseptal emphysema evident. Multiple bilateral pulmonary nodules are identified. Prior study from 11/01/2019 did not segment appropriately in DynaCAD despite attempts by the IT staff to correct the problem. As such, 2D measurements are obtained for comparison between the 2 studies. Although pulmonary nodules accent 1 appear to be stable on 2D imaging. Anterior right middle lobe nodule on image 178/3 today measures 4 mm compared to about 2 mm on the prior study, consistent with interval growth. No focal airspace consolidation. No pleural effusion. Upper Abdomen: Nodular liver contour suggests cirrhosis small cyst upper  pole right kidney again noted. Musculoskeletal: No worrisome lytic or sclerotic osseous abnormality. IMPRESSION: 1. Lung-RADS 4A, suspicious. Follow up low-dose chest CT without contrast in 3 months (please use the following order, "CT CHEST LCS NODULE FOLLOW-UP W/O CM") is recommended. Alternatively, PET may be considered when there is a solid component 34m or larger. 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 Measurements taken on 2D imaging today with apparent enlargement of an anterior right middle lobe pulmonary nodule and while tiny at 4 mm, appears progressive in the interval. As such, short interval follow-up screening CT recommended. 2. Nodular hepatic contour compatible with cirrhosis. 3.  Emphysema (ICD10-J43.9) and Aortic Atherosclerosis (ICD10-170.0) These results will be called to the ordering clinician or representative by the Radiologist Assistant, and communication documented in the PACS or [employer] Electronically Signed   By: EMisty StanleyM.D.   On: 11/19/2020 16:20        ASSESSMENT & PLAN:  1. Iron deficiency anemia, unspecified iron deficiency anemia type   2. Personal history of tobacco use, presenting hazards to health   3. [place]    # Iron deficiency anemia.  Labs are reviewed and discussed with patient. Hemoglobin has improved to 10.6, persistent iron deficiency with ferritin 48, saturation 8.  Recommend IV venofer x 1 today, repeat another dose in 2 weeks.  [facility] source of blood loss.  # Cirrhosis/ portal hypertension gastropathy. Needs to follow up with GI.   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   # Former smoker, Lung cancer screening CT 11/25/20 reviewed. Continue annual CT screening   Orders Placed This Encounter  Procedures   Iron and TIBC    Standing Status:   Future    Standing Expiration Date:   12/27/2021   CBC  with Differential/Platelet    Standing Status:   Future    Standing Expiration Date:   12/27/2021   Ferritin    Standing Status:   Future    Standing Expiration Date:   12/27/2021    All questions were answered. The patient knows to call the clinic with any problems questions or concerns.  Cc Adin Hector, MD  Return of visit:  3 months.   Earlie Server, MD, PhD Hematology Oncology [place] [facility] 12/28/2020

## 2020-12-30 ENCOUNTER — Telehealth: Payer: Self-pay

## 2020-12-30 ENCOUNTER — Other Ambulatory Visit: Payer: Self-pay | Admitting: Gastroenterology

## 2020-12-30 DIAGNOSIS — K7460 Unspecified cirrhosis of liver: Secondary | ICD-10-CM

## 2020-12-30 NOTE — Telephone Encounter (Signed)
-----   Message from Evelina Dun, RN sent at 12/30/2020  9:20 AM EDT ----- please adjust appts:   Cancel lab on 8/24 and MD on 8/26.  Keep venofer on 8/26.   Keep 3 month follow up appt as scheduled. please inform pt of appt adjustments. Only needs venofer in 2 weeks, no labs or MD.  ----- Message ----- From: Earlie Server, MD Sent: 12/28/2020   8:49 PM EDT To: Evelina Dun, RN  There is some mistake on the check out note 2 wks/ 1-2 days after labs/MD/poss Venofer 60month labs (CBC, Ferr, iron/TIBC)/MD/ poss Venofer /1-2 days after labs..CSM  I don't need to see her in 2 weeks, just repeat another dose of Venofer in 2 weeks.  3 months, labs prior, MD +/- venofer. Thanks.

## 2021-01-06 ENCOUNTER — Other Ambulatory Visit: Payer: Self-pay | Admitting: Gastroenterology

## 2021-01-06 ENCOUNTER — Other Ambulatory Visit (HOSPITAL_COMMUNITY): Payer: Self-pay | Admitting: Gastroenterology

## 2021-01-06 DIAGNOSIS — K56699 Other intestinal obstruction unspecified as to partial versus complete obstruction: Secondary | ICD-10-CM

## 2021-01-06 DIAGNOSIS — R195 Other fecal abnormalities: Secondary | ICD-10-CM

## 2021-01-06 DIAGNOSIS — K7460 Unspecified cirrhosis of liver: Secondary | ICD-10-CM

## 2021-01-06 DIAGNOSIS — K529 Noninfective gastroenteritis and colitis, unspecified: Secondary | ICD-10-CM

## 2021-01-06 DIAGNOSIS — D509 Iron deficiency anemia, unspecified: Secondary | ICD-10-CM

## 2021-01-08 ENCOUNTER — Encounter: Payer: Self-pay | Admitting: *Deleted

## 2021-01-08 ENCOUNTER — Other Ambulatory Visit: Payer: Medicare Other

## 2021-01-09 ENCOUNTER — Ambulatory Visit
Admission: RE | Admit: 2021-01-09 | Discharge: 2021-01-09 | Disposition: A | Discharge status: Home / Self Care | Payer: Medicare Other | Attending: Gastroenterology | Admitting: Gastroenterology

## 2021-01-09 ENCOUNTER — Ambulatory Visit: Payer: Medicare Other | Admitting: Anesthesiology

## 2021-01-09 ENCOUNTER — Encounter: Payer: Self-pay | Admitting: *Deleted

## 2021-01-09 ENCOUNTER — Encounter
Admission: RE | Disposition: A | Discharge status: Home / Self Care | Payer: Self-pay | Source: Home / Self Care | Attending: Gastroenterology

## 2021-01-09 DIAGNOSIS — K449 Diaphragmatic hernia without obstruction or gangrene: Secondary | ICD-10-CM | POA: Insufficient documentation

## 2021-01-09 DIAGNOSIS — Z7982 Long term (current) use of aspirin: Secondary | ICD-10-CM | POA: Insufficient documentation

## 2021-01-09 DIAGNOSIS — E119 Type 2 diabetes mellitus without complications: Secondary | ICD-10-CM | POA: Diagnosis not present

## 2021-01-09 DIAGNOSIS — Z79899 Other long term (current) drug therapy: Secondary | ICD-10-CM | POA: Diagnosis not present

## 2021-01-09 DIAGNOSIS — K644 Residual hemorrhoidal skin tags: Secondary | ICD-10-CM | POA: Insufficient documentation

## 2021-01-09 DIAGNOSIS — Z794 Long term (current) use of insulin: Secondary | ICD-10-CM | POA: Insufficient documentation

## 2021-01-09 DIAGNOSIS — Z882 Allergy status to sulfonamides status: Secondary | ICD-10-CM | POA: Diagnosis not present

## 2021-01-09 DIAGNOSIS — K317 Polyp of stomach and duodenum: Secondary | ICD-10-CM | POA: Insufficient documentation

## 2021-01-09 DIAGNOSIS — K648 Other hemorrhoids: Secondary | ICD-10-CM | POA: Diagnosis not present

## 2021-01-09 DIAGNOSIS — K7460 Unspecified cirrhosis of liver: Secondary | ICD-10-CM | POA: Diagnosis not present

## 2021-01-09 DIAGNOSIS — K621 Rectal polyp: Secondary | ICD-10-CM | POA: Diagnosis not present

## 2021-01-09 DIAGNOSIS — K635 Polyp of colon: Secondary | ICD-10-CM | POA: Diagnosis not present

## 2021-01-09 DIAGNOSIS — Z888 Allergy status to other drugs, medicaments and biological substances status: Secondary | ICD-10-CM | POA: Insufficient documentation

## 2021-01-09 DIAGNOSIS — K222 Esophageal obstruction: Secondary | ICD-10-CM | POA: Insufficient documentation

## 2021-01-09 DIAGNOSIS — Z880 Allergy status to penicillin: Secondary | ICD-10-CM | POA: Insufficient documentation

## 2021-01-09 DIAGNOSIS — K219 Gastro-esophageal reflux disease without esophagitis: Secondary | ICD-10-CM | POA: Diagnosis not present

## 2021-01-09 DIAGNOSIS — Z980 Intestinal bypass and anastomosis status: Secondary | ICD-10-CM | POA: Diagnosis not present

## 2021-01-09 DIAGNOSIS — Z9109 Other allergy status, other than to drugs and biological substances: Secondary | ICD-10-CM | POA: Insufficient documentation

## 2021-01-09 DIAGNOSIS — K5090 Crohn's disease, unspecified, without complications: Secondary | ICD-10-CM | POA: Diagnosis not present

## 2021-01-09 DIAGNOSIS — K766 Portal hypertension: Secondary | ICD-10-CM | POA: Diagnosis not present

## 2021-01-09 DIAGNOSIS — K529 Noninfective gastroenteritis and colitis, unspecified: Secondary | ICD-10-CM | POA: Diagnosis not present

## 2021-01-09 DIAGNOSIS — K6289 Other specified diseases of anus and rectum: Secondary | ICD-10-CM | POA: Diagnosis not present

## 2021-01-09 DIAGNOSIS — K31819 Angiodysplasia of stomach and duodenum without bleeding: Secondary | ICD-10-CM | POA: Diagnosis not present

## 2021-01-09 DIAGNOSIS — Z791 Long term (current) use of non-steroidal anti-inflammatories (NSAID): Secondary | ICD-10-CM | POA: Insufficient documentation

## 2021-01-09 DIAGNOSIS — D509 Iron deficiency anemia, unspecified: Secondary | ICD-10-CM | POA: Insufficient documentation

## 2021-01-09 DIAGNOSIS — I8510 Secondary esophageal varices without bleeding: Secondary | ICD-10-CM | POA: Insufficient documentation

## 2021-01-09 DIAGNOSIS — K633 Ulcer of intestine: Secondary | ICD-10-CM | POA: Insufficient documentation

## 2021-01-09 HISTORY — PX: ESOPHAGOGASTRODUODENOSCOPY: SHX5428

## 2021-01-09 HISTORY — PX: COLONOSCOPY: SHX5424

## 2021-01-09 LAB — GLUCOSE, CAPILLARY: Glucose-Capillary: 166 mg/dL — ABNORMAL HIGH (ref 70–99)

## 2021-01-09 SURGERY — COLONOSCOPY
Anesthesia: General

## 2021-01-09 MED ORDER — PROPOFOL 10 MG/ML IV BOLUS
INTRAVENOUS | Status: AC
  Filled 2021-01-09: qty 20

## 2021-01-09 MED ORDER — LIDOCAINE HCL (PF) 2 % IJ SOLN
INTRAMUSCULAR | Status: AC
  Filled 2021-01-09: qty 5

## 2021-01-09 MED ORDER — PROPOFOL 500 MG/50ML IV EMUL
INTRAVENOUS | Status: DC | PRN
  Administered 2021-01-09: 120 ug/kg/min via INTRAVENOUS

## 2021-01-09 MED ORDER — SODIUM CHLORIDE 0.9 % IV SOLN: INTRAVENOUS | Status: DC

## 2021-01-09 MED ORDER — PROPOFOL 500 MG/50ML IV EMUL
INTRAVENOUS | Status: AC
  Filled 2021-01-09: qty 50

## 2021-01-09 MED ORDER — LIDOCAINE HCL (CARDIAC) PF 100 MG/5ML IV SOSY
PREFILLED_SYRINGE | INTRAVENOUS | Status: DC | PRN
  Administered 2021-01-09: 50 mg via INTRAVENOUS

## 2021-01-09 NOTE — Op Note (Signed)
[facility] Gastroenterology Patient Name: Brenda Beasley Procedure Date: 01/09/2021 9:09 AM MRN: [id number] Account #: [id number] Date of Birth: 09/23/1950 Admit Type: Outpatient Age: 70 Room: [facility] ENDO ROOM 1 Gender: Female Note Status: Finalized Procedure:             Upper GI endoscopy Indications:           Screening procedure, Iron deficiency anemia Providers:             Rueben Bash, DO Referring MD:          Ramonita Lab, MD (Referring MD) Medicines:             Monitored Anesthesia Care Complications:         No immediate complications. Estimated blood loss:                         Minimal. Procedure:             Pre-Anesthesia Assessment:                        - Prior to the procedure, a History and Physical was                         performed, and patient medications and allergies were                         reviewed. The patient is competent. The risks and                         benefits of the procedure and the sedation options and                         risks were discussed with the patient. All questions                         were answered and informed consent was obtained.                         Patient identification and proposed procedure were                         verified by the physician, the nurse, the anesthetist                         and the technician in the endoscopy suite. Mental                         Status Examination: alert and oriented. Airway                         Examination: normal oropharyngeal airway and neck                         mobility. Respiratory Examination: clear to                         auscultation. CV Examination: RRR, no murmurs, no S3  or S4. Prophylactic Antibiotics: The patient does not                         require prophylactic antibiotics. Prior                         Anticoagulants: The patient has taken no previous                         anticoagulant  or antiplatelet agents. ASA Grade                         Assessment: III - A patient with severe systemic                         disease. After reviewing the risks and benefits, the                         patient was deemed in satisfactory condition to                         undergo the procedure. The anesthesia plan was to use                         monitored anesthesia care (MAC). Immediately prior to                         administration of medications, the patient was                         re-assessed for adequacy to receive sedatives. The                         heart rate, respiratory rate, oxygen saturations,                         blood pressure, adequacy of pulmonary ventilation, and                         response to care were monitored throughout the                         procedure. The physical status of the patient was                         re-assessed after the procedure.                        After obtaining informed consent, the endoscope was                         passed under direct vision. Throughout the procedure,                         the patient's blood pressure, pulse, and oxygen                         saturations were monitored continuously. The Endoscope  was introduced through the mouth, and advanced to the                         second part of duodenum. The upper GI endoscopy was                         accomplished without difficulty. The patient tolerated                         the procedure well. Findings:      Two small angioectasias without bleeding were found in the duodenal       bulb. Coagulation for bleeding prevention using argon plasma at 0.5       liters/minute and 20 watts was successful. Estimated blood loss was       minimal. Estimated blood loss was minimal.      The first portion of the duodenum and second portion of the duodenum       were normal. Biopsies for histology were taken with a cold forceps  for       evaluation of celiac disease. Estimated blood loss was minimal.      Mild portal hypertensive gastropathy was found in the entire examined       stomach. Biopsies were taken with a cold forceps for histology.       Estimated blood loss was minimal.      Multiple 2 to 12 mm pedunculated and sessile polyps with no bleeding and       no stigmata of recent bleeding were found in the entire examined       stomach. The polyp was removed with a hot snare. Resection and retrieval       were complete. To prevent bleeding after the polypectomy, one hemostatic       clip was successfully placed. There was no bleeding during, or at the       end, of the procedure. Estimated blood loss was minimal.      A small hiatal hernia was present. Estimated blood loss: none.      Grade I varices were found in the distal esophagus. Estimated blood       loss: none.      A widely patent Schatzki ring was found in the distal esophagus.       Estimated blood loss: none. Not dilated due to varix nearby.      Esophagogastric landmarks were identified: the Z-line was found at 35 cm       and the gastroesophageal junction was found at 35 cm from the incisors. Impression:            - Two non-bleeding angioectasias in the duodenum.                         Treated with argon plasma coagulation (APC).                        - Normal first portion of the duodenum and second                         portion of the duodenum. Biopsied.                        - Portal hypertensive gastropathy. Biopsied.                        -  Multiple gastric polyps. Resected and retrieved.                         Clip was placed.                        - Small hiatal hernia.                        - Grade I esophageal varices.                        - Widely patent Schatzki ring.                        - Esophagogastric landmarks identified. Recommendation:        - Discharge patient to home.                        - Resume  previous diet.                        - Continue present medications.                        - Await pathology results.                        - Repeat upper endoscopy for surveillance based on                         pathology results.                        - Return to referring physician as previously                         scheduled. Procedure Code(s):     --- Professional ---                        76734, 59, Esophagogastroduodenoscopy, flexible,                         transoral; with control of bleeding, any method                        43251, Esophagogastroduodenoscopy, flexible,                         transoral; with removal of tumor(s), polyp(s), or                         other lesion(s) by snare technique                        43239, 59,51, Esophagogastroduodenoscopy, flexible,                         transoral; with biopsy, single or multiple Diagnosis Code(s):     --- Professional ---                        L93.790, Angiodysplasia of stomach and duodenum  without bleeding                        K76.6, Portal hypertension                        K31.89, Other diseases of stomach and duodenum                        K31.7, Polyp of stomach and duodenum                        K44.9, Diaphragmatic hernia without obstruction or                         gangrene                        I85.00, Esophageal varices without bleeding                        K22.2, Esophageal obstruction                        Z13.810, Encounter for screening for upper                         gastrointestinal disorder                        D50.9, Iron deficiency anemia, unspecified CPT copyright 2019 American Medical Association. All rights reserved. The codes documented in this report are preliminary and upon coder review may  be revised to meet current compliance requirements. Attending Participation:      I personally performed the entire procedure. Volney American,  DO Annamaria Helling DO, DO 01/09/2021 10:45:41 AM This report has been signed electronically. Number of Addenda: 0 Note Initiated On: 01/09/2021 9:09 AM Estimated Blood Loss:  Estimated blood loss was minimal.      [facility]

## 2021-01-09 NOTE — Anesthesia Postprocedure Evaluation (Signed)
Anesthesia Post Note  Patient: Brenda Beasley  Procedure(s) Performed: COLONOSCOPY ESOPHAGOGASTRODUODENOSCOPY (EGD)  Patient location during evaluation: Endoscopy Anesthesia Type: General Level of consciousness: awake and alert Pain management: pain level controlled Vital Signs Assessment: post-procedure vital signs reviewed and stable Respiratory status: spontaneous breathing, nonlabored ventilation, respiratory function stable and patient connected to nasal cannula oxygen Cardiovascular status: blood pressure returned to baseline and stable Postop Assessment: no apparent nausea or vomiting Anesthetic complications: no   No notable events documented.   Last Vitals:  Vitals:   01/09/21 1058 01/09/21 1108  BP: (!) 113/52 (!) 128/56  Pulse: 82 77  Resp: (!) 21 17  Temp:    SpO2: 93% 95%    Last Pain:  Vitals:   01/09/21 1108  TempSrc:   PainSc: 0-No pain                 Martha Clan

## 2021-01-09 NOTE — Transfer of Care (Signed)
Immediate Anesthesia Transfer of Care Note  Patient: Brenda Beasley  Procedure(s) Performed: COLONOSCOPY ESOPHAGOGASTRODUODENOSCOPY (EGD)  Patient Location: PACU  Anesthesia Type:General  Level of Consciousness: awake and sedated  Airway & Oxygen Therapy: Patient Spontanous Breathing and Patient connected to nasal cannula oxygen  Post-op Assessment: Report given to RN and Post -op Vital signs reviewed and stable  Post vital signs: Reviewed and stable  Last Vitals:  Vitals Value Taken Time  BP    Temp    Pulse    Resp    SpO2      Last Pain:  Vitals:   01/09/21 0819  TempSrc: Temporal         Complications: No notable events documented.

## 2021-01-09 NOTE — Anesthesia Procedure Notes (Signed)
Date/Time: 01/09/2021 9:32 AM Performed by: Vaughan Sine Pre-anesthesia Checklist: Patient identified, Emergency Drugs available, Suction available, Patient being monitored and Timeout performed Patient Re-evaluated:Patient Re-evaluated prior to induction Oxygen Delivery Method: Nasal cannula Preoxygenation: Pre-oxygenation with 100% oxygen Induction Type: Combination inhalational/ intravenous induction Airway Equipment and Method: Bite block Placement Confirmation: positive ETCO2 and CO2 detector

## 2021-01-09 NOTE — H&P (Signed)
[facility] Pre-Procedure H&P   Patient ID: Brenda Beasley is a 70 y.o. female.  Gastroenterology Provider: Annamaria Helling, DO  Referring Provider: Octavia Bruckner, PA PCP: Adin Hector, MD  Date: 01/09/2021  HPI Ms. Brenda Beasley is a 70 y.o. female who presents today for Esophagogastroduodenoscopy and Colonoscopy for cirrhosis portal hypertension surveillance/evaluation and Crohns disease.  Last EGD and colonoscopy in 2020.  Not currently on therapy for Crohns disease. 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 Pt with chronic diarrhea, no blood or melena. C/o abdominal pain. + nausea with no vomiting. No jaundice or confusion. No other acute GI complaints.   Past Medical History:  Diagnosis Date   Anxiety    Asthma    Chronic pruritus    Cirrhosis of liver (HCC)    COPD (chronic obstructive pulmonary disease) ([place])    Diabetes mellitus without complication (HCC)    GERD (gastroesophageal reflux disease)    Hematuria, microscopic    History of uterine fibroid    History of ventral hernia    Hyperlipidemia    Hypertension    IBS (irritable bowel syndrome)    IDA (iron deficiency anemia) 11/13/2020   Osteoporosis with current pathological fracture 11/03/2018   Personal history of tobacco use, presenting hazards to health 09/20/2015   Proteinuria    Stroke ([place])    Tobacco abuse 10/04/2014   Vitamin B 12 deficiency    Vitamin D deficiency     Past Surgical History:  Procedure Laterality Date   COLON SURGERY     COLONOSCOPY WITH PROPOFOL N/A 11/23/2018   Procedure: COLONOSCOPY WITH PROPOFOL;  Surgeon: Toledo, Benay Pike, MD;  Location: [facility] ENDOSCOPY;  Service: Gastroenterology;  Laterality: N/A;   ESOPHAGOGASTRODUODENOSCOPY (EGD) WITH PROPOFOL N/A 11/23/2018   Procedure: ESOPHAGOGASTRODUODENOSCOPY (EGD) WITH PROPOFOL;  Surgeon: Toledo, Benay Pike, MD;  Location: [facility] ENDOSCOPY;  Service: Gastroenterology;  Laterality: N/A;    HERNIA REPAIR     PARTIAL COLECTOMY     TONSILLECTOMY     VENTRAL HERNIA REPAIR      Family History No h/o GI disease or malignancy  Review of Systems  Constitutional:  Negative for activity change, appetite change, fatigue, fever and unexpected weight change.  HENT:  Positive for trouble swallowing (solids, liquids). Negative for voice change.   Respiratory:  Negative for shortness of breath and wheezing.   Cardiovascular:  Negative for chest pain, palpitations and leg swelling.  Gastrointestinal:  Positive for abdominal pain, diarrhea and nausea. Negative for abdominal distention, anal bleeding, blood in stool, constipation, rectal pain and vomiting.  Musculoskeletal:  Negative for arthralgias and myalgias.  Skin:  Negative for color change and pallor.  Neurological:  Negative for dizziness, syncope and weakness.  Psychiatric/Behavioral:  Negative for agitation and confusion.   All other systems reviewed and are negative.   Medications No current facility-administered medications on file prior to encounter.   Current Outpatient Medications on File Prior to Encounter  Medication Sig Dispense Refill   albuterol (VENTOLIN HFA) 108 (90 Base) MCG/ACT inhaler Inhale 2 puffs into the lungs every 6 (six) hours as needed.     aspirin EC 81 MG tablet Take 81 mg by mouth daily.     Insulin Glargine, 2 Unit Dial, (TOUJEO MAX SOLOSTAR) 300 UNIT/ML SOPN Inject 300 Units/mL into the skin at bedtime.     lisinopril (PRINIVIL,ZESTRIL) 40 MG tablet Take 20 mg by mouth daily.      metFORMIN (GLUCOPHAGE) 1000 MG tablet Take  1,000 mg by mouth 2 (two) times daily.     naproxen (NAPROSYN) 375 MG tablet Take 1 tablet (375 mg total) by mouth 2 (two) times daily with a meal. 14 tablet 0   amLODipine (NORVASC) 5 MG tablet Take 2.5 mg by mouth daily.      ANORO ELLIPTA 62.5-25 MCG/INH AEPB Take 1 Inhaler by mouth daily.  4   CALCIUM PO Take 1 tablet by mouth every evening.     Cholecalciferol (VITAMIN D3)  5000 units TABS Take 5,000 Units by mouth daily.      cyanocobalamin (,VITAMIN B-12,) 1000 MCG/ML injection Inject 1,000 mcg into the muscle every 30 (thirty) days.     Cyanocobalamin-Methylcobalamin 10000 (B12) MCG/2ML LIQD Inject into the muscle every 30 (thirty) days.     doxycycline (VIBRAMYCIN) 100 MG capsule Take 1 capsule (100 mg total) by mouth 2 (two) times daily. (Patient not taking: Reported on 11/12/2020) 28 capsule 0   ferrous sulfate 325 (65 FE) MG tablet Take 325 mg by mouth daily.     fluticasone (FLONASE) 50 MCG/ACT nasal spray Place 2 sprays into the nose daily.     gabapentin (NEURONTIN) 100 MG capsule Take 100 mg by mouth at bedtime as needed.     glipiZIDE (GLUCOTROL) 10 MG tablet Take 20 mg 2 (two) times daily by mouth.      hydrochlorothiazide (HYDRODIURIL) 25 MG tablet Take 12.5 mg by mouth daily.      HYDROcodone-homatropine (HYCODAN) 5-1.5 MG/5ML syrup Take 5 mLs by mouth every 6 (six) hours as needed for cough. 120 mL 0   ipratropium-albuterol (DUONEB) 0.5-2.5 (3) MG/3ML SOLN Take 3 mLs 4 (four) times daily as needed by nebulization.     loperamide (IMODIUM) 2 MG capsule Take 1 capsule (2 mg total) every 6 (six) hours as needed by mouth for diarrhea or loose stools. 30 capsule 0   lovastatin (MEVACOR) 40 MG tablet Take 2 tablets by mouth daily.     MAGNESIUM PO Take 1 tablet by mouth at bedtime.      omeprazole (PRILOSEC) 20 MG capsule Take 20 mg by mouth 2 (two) times daily.     ondansetron (ZOFRAN ODT) 4 MG disintegrating tablet Take 1 tablet (4 mg total) by mouth every 8 (eight) hours as needed. 20 tablet 0   vitamin C (ASCORBIC ACID) 250 MG tablet Take 250 mg by mouth daily.      Pertinent medications related to GI and procedure were reviewed by me with the patient prior to the procedure   Current Facility-Administered Medications:    0.9 %  sodium chloride infusion, , Intravenous, Continuous, Annamaria Helling, DO, Last Rate: 20 mL/hr at 01/09/21 0840, New  Bag at 01/09/21 0840  sodium chloride 20 mL/hr at 01/09/21 0840       Allergies  Allergen Reactions   Molds & Smuts Shortness Of Breath   Other Shortness Of Breath and Hives    Cats hives Dust causes sneezing Affects breathing Tape-unknown   Penicillins Hives and Swelling    Throat swell  Has patient had a PCN reaction causing immediate rash, facial/tongue/throat swelling, SOB or lightheadedness with hypotension: Yes Has patient had a PCN reaction causing severe rash involving mucus membranes or skin necrosis: Unknown Has patient had a PCN reaction that required hospitalization: Unknown Has patient had a PCN reaction occurring within the last 10 years: Unknown If all of the above answers are "NO", then may proceed with Cephalosporin use.    Pioglitazone Other (See  Comments) and Palpitations    Chest pain, numbness in fingers,    Byetta 10 Mcg Pen [Exenatide] Other (See Comments)    Severe stomach pains   Dapagliflozin Other (See Comments)    Intolerant/dyspnea   Jardiance [Empagliflozin] Other (See Comments)   Victoza [Liraglutide] Other (See Comments)    Severe stomach pains   Sulfa Antibiotics Rash   Sulfasalazine Rash   Allergies were reviewed by me prior to the procedure  Objective    Vitals:   01/09/21 0819  BP: (!) 149/77  Pulse: 83  Resp: 20  Temp: 97.6 F (36.4 C)  TempSrc: Temporal  SpO2: 95%  Weight: 74.8 kg  Height: '4\' 11"'$  (1.499 m)    Physical Exam Vitals and nursing note reviewed.  Constitutional:      General: She is not in acute distress.    Appearance: Normal appearance. She is not ill-appearing, toxic-appearing or diaphoretic.  HENT:     Head: Normocephalic and atraumatic.     Nose: Nose normal.     Mouth/Throat:     Mouth: Mucous membranes are moist.     Pharynx: Oropharynx is clear.     Comments: edentulous Eyes:     General: No scleral icterus.    Extraocular Movements: Extraocular movements intact.  Cardiovascular:     Rate and  Rhythm: Normal rate and regular rhythm.     Heart sounds: Murmur heard.    No friction rub. No gallop.  Pulmonary:     Effort: Pulmonary effort is normal. No respiratory distress.     Breath sounds: Normal breath sounds. No wheezing, rhonchi or rales.  Abdominal:     General: Abdomen is flat. Bowel sounds are normal. There is no distension.     Palpations: Abdomen is soft.     Tenderness: There is abdominal tenderness (generalized, no guarding/rigidity, non peritoneal). There is no guarding or rebound.  Musculoskeletal:     Cervical back: Neck supple.     Right lower leg: No edema.     Left lower leg: No edema.  Skin:    General: Skin is warm and dry.     Coloration: Skin is not jaundiced or pale.  Neurological:     General: No focal deficit present.     Mental Status: She is alert and oriented to person, place, and time. Mental status is at baseline.     Comments: No asterixis   Psychiatric:        Mood and Affect: Mood normal.        Behavior: Behavior normal.        Thought Content: Thought content normal.        Judgment: Judgment normal.     Assessment:  Ms. Brenda Beasley is a 70 y.o. female  who presents today for Esophagogastroduodenoscopy and Colonoscopy for cirrhosis portal hypertension surveillance/evaluation and Crohns disease.  Plan:  Esophagogastroduodenoscopy and Colonoscopy with possible intervention today  Esophagogastroduodenoscopy with possible biopsy, control of bleeding, polypectomy, and interventions as necessary has been discussed with the patient/patient representative. Informed consent was obtained from the patient/patient representative after explaining the indication, nature, and risks of the procedure including but not limited to death, bleeding, perforation, missed neoplasm/lesions, cardiorespiratory compromise, and reaction to medications. Opportunity for questions was given and appropriate answers were provided. Patient/patient representative has  verbalized understanding is amenable to undergoing the procedure.  Colonoscopy with possible biopsy, control of bleeding, polypectomy, and interventions as necessary has been discussed with the patient/patient representative. Informed consent was  obtained from the patient/patient representative after explaining the indication, nature, and risks of the procedure including but not limited to death, bleeding, perforation, missed neoplasm/lesions, cardiorespiratory compromise, and reaction to medications. Opportunity for questions was given and appropriate answers were provided. Patient/patient representative has verbalized understanding is amenable to undergoing the procedure.  Annamaria Helling, DO  [facility]  Portions of the record may have been created with voice recognition software. Occasional wrong-word or 'sound-a-like' substitutions may have occurred due to the inherent limitations of voice recognition software.  Read the chart carefully and recognize, using context, where substitutions may have occurred.

## 2021-01-09 NOTE — Anesthesia Preprocedure Evaluation (Signed)
Anesthesia Evaluation  Patient identified by MRN, date of birth, ID band Patient awake    Reviewed: Allergy & Precautions, H&P , NPO status , [name] Chart, lab work & pertinent test results  History of Anesthesia Complications Negative for: history of anesthetic complications  Airway Mallampati: II  TM Distance: >3 FB     Dental  (+) Upper Dentures, Lower Dentures, Dental Advidsory Given   Pulmonary shortness of breath and with exertion, asthma , neg sleep apnea, COPD,  COPD inhaler, neg recent URI, former smoker,           Cardiovascular hypertension, (-) angina(-) Past MI and (-) Cardiac Stents (-) dysrhythmias (-) Valvular Problems/Murmurs     Neuro/Psych neg Seizures Anxiety CVA negative psych ROS   GI/Hepatic Neg liver ROS, GERD  Controlled,  Endo/Other  diabetes  Renal/GU negative Renal ROS  negative genitourinary   Musculoskeletal   Abdominal   Peds  Hematology negative hematology ROS (+)   Anesthesia Other Findings Past Medical History: No date: Anxiety No date: Asthma No date: Chronic pruritus No date: Cirrhosis of liver (HCC) No date: COPD (chronic obstructive pulmonary disease) (HCC) No date: Diabetes mellitus without complication (HCC) No date: GERD (gastroesophageal reflux disease) No date: Hematuria, microscopic No date: History of uterine fibroid No date: History of ventral hernia No date: Hyperlipidemia No date: Hypertension No date: IBS (irritable bowel syndrome) 11/03/2018: Osteoporosis with current pathological fracture 09/20/2015: Personal history of tobacco use, presenting hazards to  health No date: Proteinuria No date: Stroke ([place]) 10/04/2014: Tobacco abuse No date: Vitamin B 12 deficiency No date: Vitamin D deficiency  Past Surgical History: No date: COLON SURGERY No date: HERNIA REPAIR No date: PARTIAL COLECTOMY No date: TONSILLECTOMY No date: VENTRAL HERNIA REPAIR  BMI     Body Mass Index: 33.76 kg/m      Reproductive/Obstetrics negative OB ROS                             Anesthesia Physical  Anesthesia Plan  ASA: 3  Anesthesia Plan: General   Post-op Pain Management:    Induction: Intravenous  PONV Risk Score and Plan: Propofol infusion and TIVA  Airway Management Planned: Natural Airway and Nasal Cannula  Additional Equipment:   Intra-op Plan:   Post-operative Plan:   Informed Consent: I have reviewed the patients History and Physical, chart, labs and discussed the procedure including the risks, benefits and alternatives for the proposed anesthesia with the patient or authorized representative who has indicated his/her understanding and acceptance.     Dental Advisory Given  Plan Discussed with: Anesthesiologist and CRNA  Anesthesia Plan Comments:         Anesthesia Quick Evaluation

## 2021-01-09 NOTE — Interval H&P Note (Signed)
History and Physical Interval Note: Preprocedure H&P from 01/09/21  was reviewed and there was no interval change after seeing and examining the patient.  Written consent was obtained from the patient after discussion of risks, benefits, and alternatives. Patient has consented to proceed with Esophagogastroduodenoscopy and Colonoscopy with possible intervention   01/09/2021 9:23 AM  Brenda Beasley  has presented today for surgery, with the diagnosis of (D50.9) IRON DEFICIENCY ANEMIA (K76.6, K31.89) PORAL HYPERTENSIVE GASTROPATHY (K74.60) CIRRHOSIS OF LIVER WITHOUT ASCITES  (K56.699) SMALL BOWEL STRICTURE  (R19.5) HEME POSITIVE STOOL.  The various methods of treatment have been discussed with the patient and family. After consideration of risks, benefits and other options for treatment, the patient has consented to  Procedure(s): COLONOSCOPY (N/A) ESOPHAGOGASTRODUODENOSCOPY (EGD) (N/A) as a surgical intervention.  The patient's history has been reviewed, patient examined, no change in status, stable for surgery.  I have reviewed the patient's chart and labs.  Questions were answered to the patient's satisfaction.     Annamaria Helling

## 2021-01-09 NOTE — Op Note (Signed)
[facility] Patient Name: Brenda Beasley Procedure Date: 01/09/2021 9:08 AM MRN: [id number] Account #: [id number] Date of Birth: 1950-06-18 Admit Type: Outpatient Age: 70 Room: [facility] ENDO ROOM 1 Gender: Female Note Status: Finalized Procedure:             Colonoscopy Indications:           Iron deficiency anemia, Personal history of Crohn's                         disease Providers:             Rueben Bash, DO Referring MD:          Ramonita Lab, MD (Referring MD) Medicines:             Monitored Anesthesia Care Complications:         No immediate complications. Estimated blood loss:                         Minimal. Procedure:             Pre-Anesthesia Assessment:                        - Prior to the procedure, a History and Physical was                         performed, and patient medications and allergies were                         reviewed. The patient is competent. The risks and                         benefits of the procedure and the sedation options and                         risks were discussed with the patient. All questions                         were answered and informed consent was obtained.                         Patient identification and proposed procedure were                         verified by the physician, the nurse, the anesthetist                         and the technician in the endoscopy suite. Mental                         Status Examination: alert and oriented. Airway                         Examination: normal oropharyngeal airway and neck                         mobility. Respiratory Examination: rhonchi. CV  Examination: RRR, no murmurs, no S3 or S4.                         Prophylactic Antibiotics: The patient does not require                         prophylactic antibiotics. Prior Anticoagulants: The                         patient has taken no previous anticoagulant or                          antiplatelet agents. ASA Grade Assessment: III - A                         patient with severe systemic disease. After reviewing                         the risks and benefits, the patient was deemed in                         satisfactory condition to undergo the procedure. The                         anesthesia plan was to use monitored anesthesia care                         (MAC). Immediately prior to administration of                         medications, the patient was re-assessed for adequacy                         to receive sedatives. The heart rate, respiratory                         rate, oxygen saturations, blood pressure, adequacy of                         pulmonary ventilation, and response to care were                         monitored throughout the procedure. The physical                         status of the patient was re-assessed after the                         procedure.                        After obtaining informed consent, the colonoscope was                         passed under direct vision. Throughout the procedure,                         the patient's blood pressure, pulse, and oxygen  saturations were monitored continuously. The                         Colonoscope was introduced through the anus and                         advanced to the the ileocolonic anastomosis. The                         colonoscopy was performed without difficulty. The                         patient tolerated the procedure well. The quality of                         the bowel preparation was excellent. Neo-terminal                         ileum anastamosis were photographed. Findings:      Skin tags were found on perianal exam.      The digital rectal exam was normal. Pertinent negatives include normal       sphincter tone. No abscess or fistula appreciated.      There was evidence of a patent end-to-side ilio-colonic anastomosis        found in the neo-terminal Ileum. This was characterized by ulceration       non bleeding. This was traversed. The ileum and ulcer were biopsied with       a cold forceps for histology. Estimated blood loss was minimal. The       ileal tissue appeared normal with no other ulceration, traversed       approximately 15cm.      The blind loop had two adjacent parts, however, one appeared to be open,       but could not be traversed.      There was a sessile polyp near the proximal anastamosis that was removed       and retireved with cold forceps. Approximately 21m in size.      Random colon biopsies for crohns disease were taken with cold forceps      Cold biopsy 2-316msessile sigmoid polyp. completely resected and       retrieved.      Hot Snare, pedunculated rectal polyp approximately 1281mompletely       removed and retrived with hot snare.      Inflammation of the rectum biopsied with cold forceps.      Cold forcep removal of 2-3mm39mssile rectal polyp.      Internal hemorrhoids      Diverticulosis distal remnant colon. Impression:            - Perianal skin tags found on perianal exam.                        The digital rectal exam was normal. Pertinent                         negatives include normal sphincter tone. No abscess or                         fistula appreciated.                        -  Patent end-to-side ileo-colonic anastomosis,                         characterized by ulceration. Biopsied.                        There was evidence of a patent end-to-side                         ilio-colonic anastomosis found in the neo-terminal                         Ileum. This was characterized by ulceration non                         bleeding. This was traversed. The ileum and ulcer were                         biopsied with a cold forceps for histology. Estimated                         blood loss was minimal. The ileal tissue appeared                         normal with  no other ulceration, traversed                         approximately 15cm.                        The blind loop had two adjacent parts, however, one                         appeared to be open, but could not be traversed.                        There was a sessile polyp near the proximal                         anastamosis that was removed and retireved with cold                         forceps. Approximately 66m in size.                        Random colon biopsies for crohns disease were taken                         with cold forceps                        Cold biopsy 2-32msessile sigmoid polyp. completely                         resected and retrieved.                        Hot Snare, pedunculated rectal polyp approximately  76m completely removed and retrived with hot snare.                        Inflammation of the rectum biopsied with cold forceps.                        Cold forcep removal of 2-325msessile rectal polyp.                        Internal hemorrhoids                        Diverticulosis distal remnant colon. Recommendation:        - Discharge patient to home.                        - Resume previous diet.                        - No ibuprofen, naproxen, or other non-steroidal                         anti-inflammatory drugs indefintely after polyp                         removal.                        - Await pathology results.                        - Repeat colonoscopy for surveillance based on                         pathology results.                        - Return to referring physician as previously                         scheduled. Procedure Code(s):     --- Professional ---                        [phone], flexible; with biopsy, single or                         multiple Diagnosis Code(s):     --- Professional ---                        Z98.0, Intestinal bypass and anastomosis status                        K64.4,  Residual hemorrhoidal skin tags                        D50.9, Iron deficiency anemia, unspecified                        Z87.19, Personal history of other diseases of the                         digestive system  CPT copyright 2019 [employer]. All rights reserved. The codes documented in this report are preliminary and upon coder review may  be revised to meet current compliance requirements. Attending Participation:      I personally performed the entire procedure. Volney American, DO Annamaria Helling DO, DO 01/09/2021 10:59:25 AM This report has been signed electronically. Number of Addenda: 0 Note Initiated On: 01/09/2021 9:08 AM Scope Withdrawal Time: 0 hours 28 minutes 37 seconds  Total Procedure Duration: 0 hours 40 minutes 27 seconds  Estimated Blood Loss:  Estimated blood loss was minimal.      [facility]

## 2021-01-10 ENCOUNTER — Inpatient Hospital Stay: Payer: Medicare Other

## 2021-01-10 ENCOUNTER — Encounter: Payer: Self-pay | Admitting: Gastroenterology

## 2021-01-10 ENCOUNTER — Ambulatory Visit: Payer: Medicare Other | Admitting: Oncology

## 2021-01-10 IMAGING — CT CT CHEST LCS NODULE FOLLOW-UP W/O CM
2 of 6 series · 15 of 40 positions shown, 18 images · non-contrast
Comparison: 04/22/2018

CLINICAL DATA: Lung cancer screening. Fifty-four pack-year history.
Former smoker.

EXAM:
CT CHEST WITHOUT CONTRAST FOR LUNG CANCER SCREENING NODULE FOLLOW-UP
TECHNIQUE: Multidetector CT imaging of the chest was performed following the
standard protocol without IV contrast.

[Series 3: lung lcs f/u · axial · 0.67mm/px · z∈[-1145,-877]mm · 12 of 300 slices shown, 15 images]
[im 16/300  mediastinal]
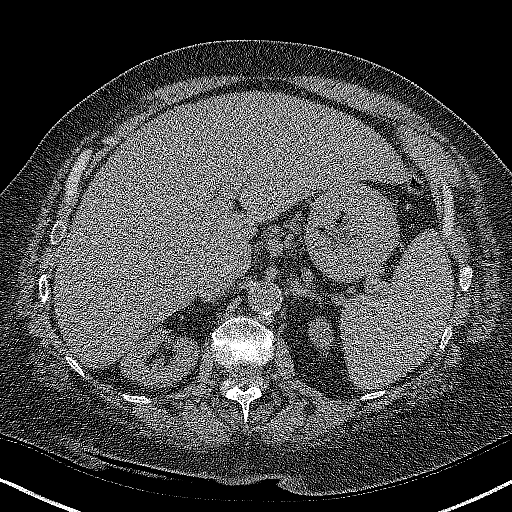
[im 16/300  lung]
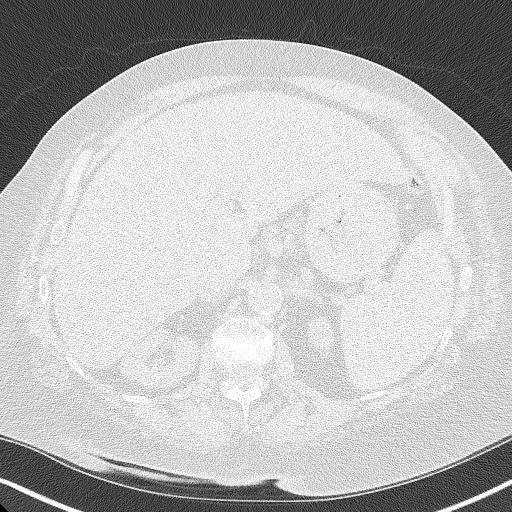
[im 48/300  lung]
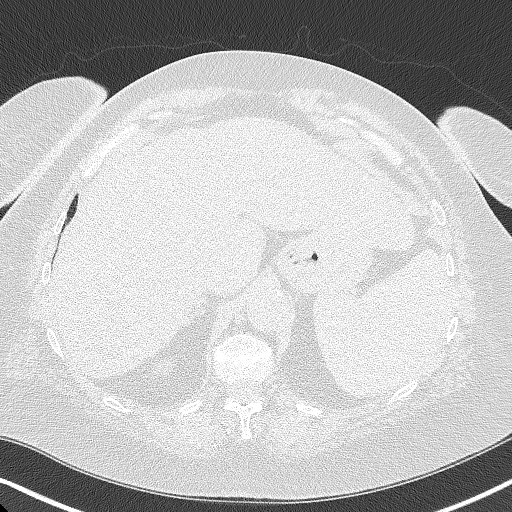
[im 63/300  lung]
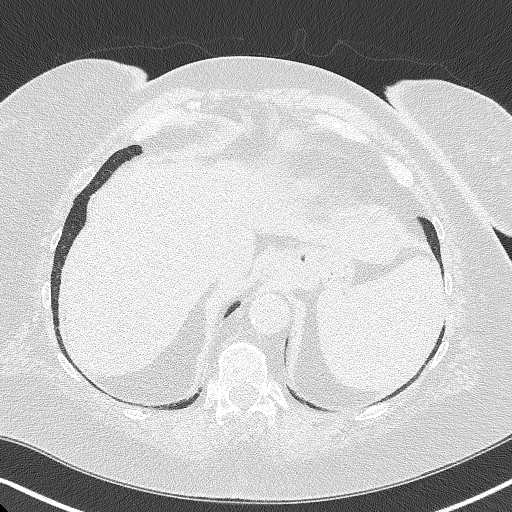
[im 95/300  lung]
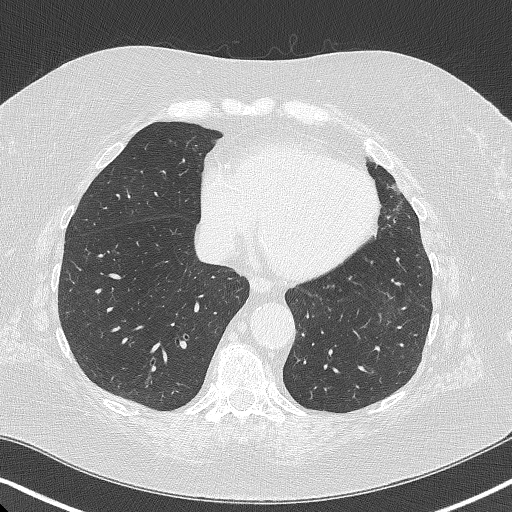
[im 111/300  mediastinal]
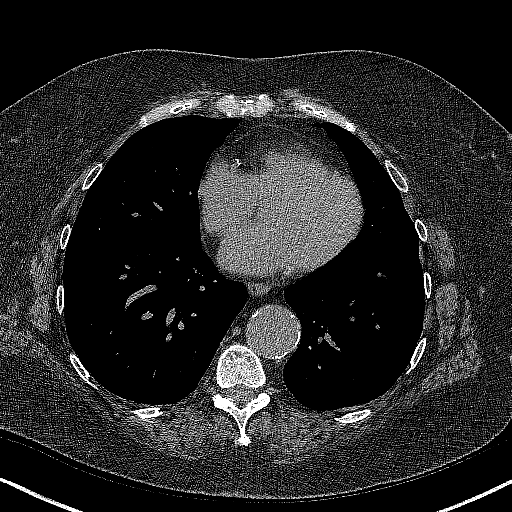
[im 111/300  lung]
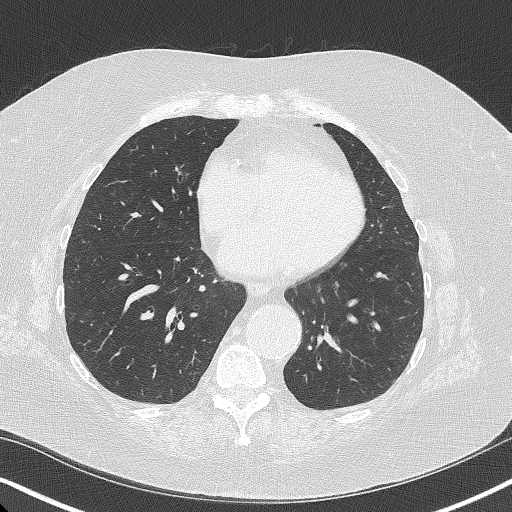
[im 142/300  lung]
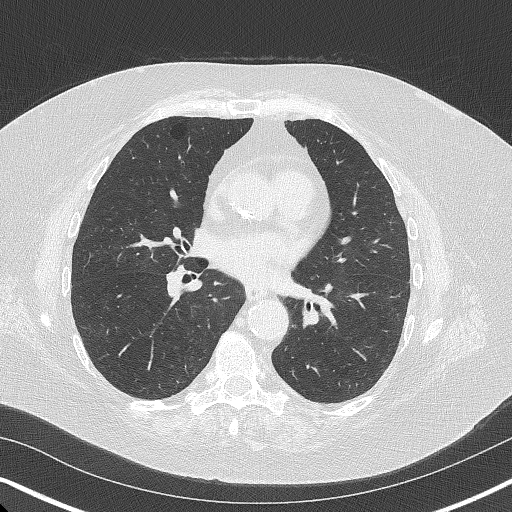
[im 158/300  lung]
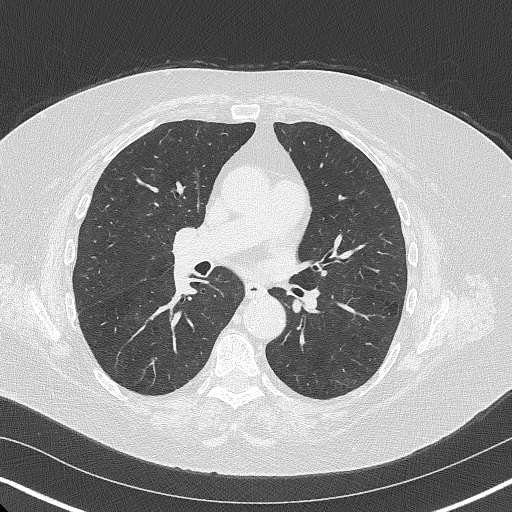
[im 189/300  lung]
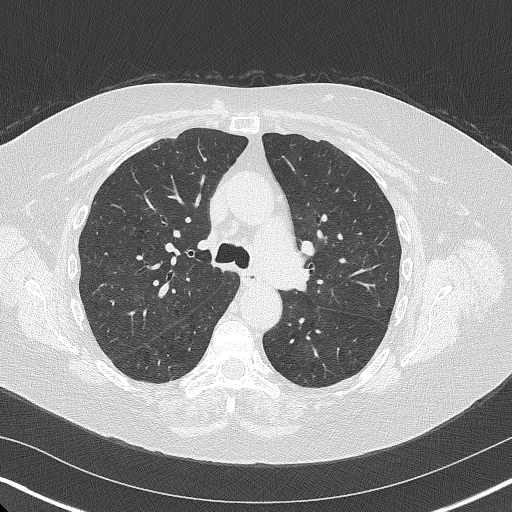
[im 205/300  mediastinal]
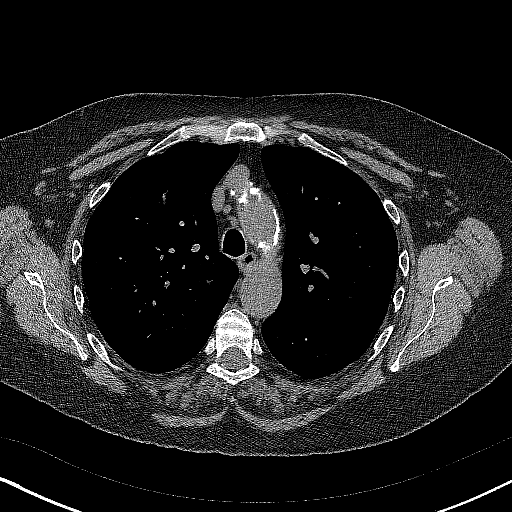
[im 205/300  lung]
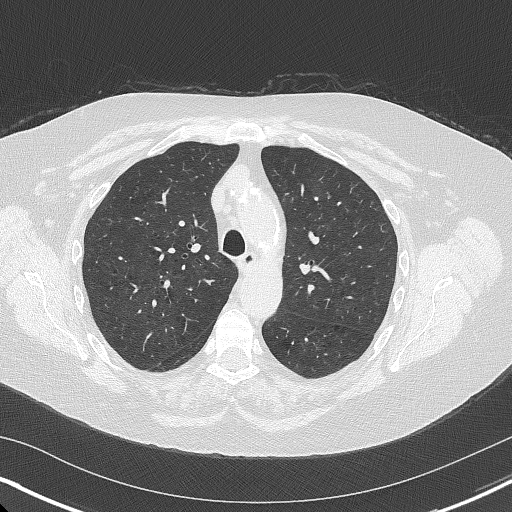
[im 237/300  lung]
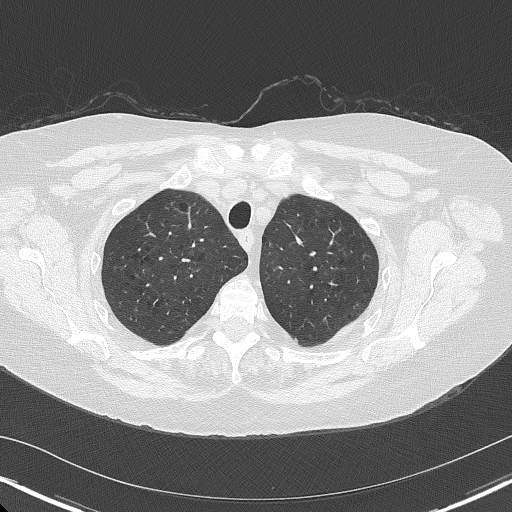
[im 252/300  lung]
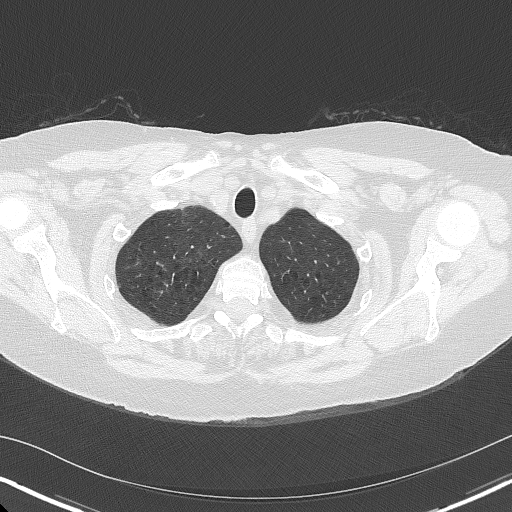
[im 284/300  lung]
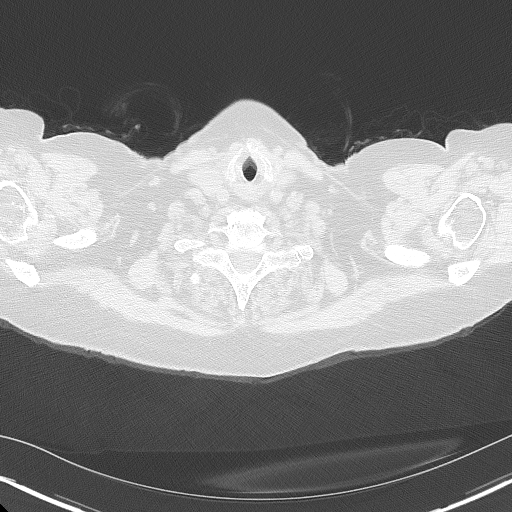

[Series 4: lcs f/u · coronal · 0.59mm/px · 3 of 344 slices shown]
[im 69/344  lung]
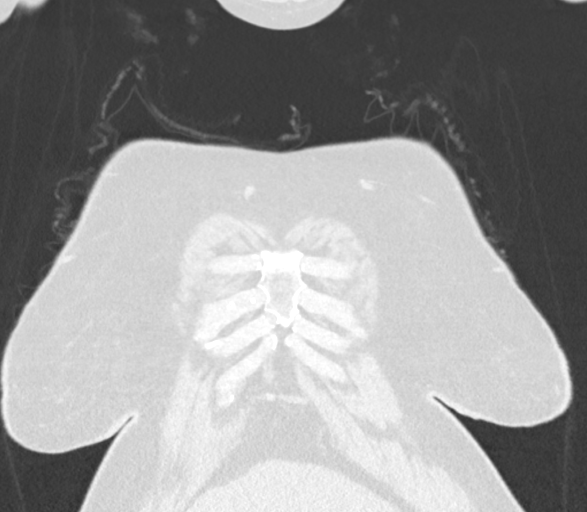
[im 138/344  lung]
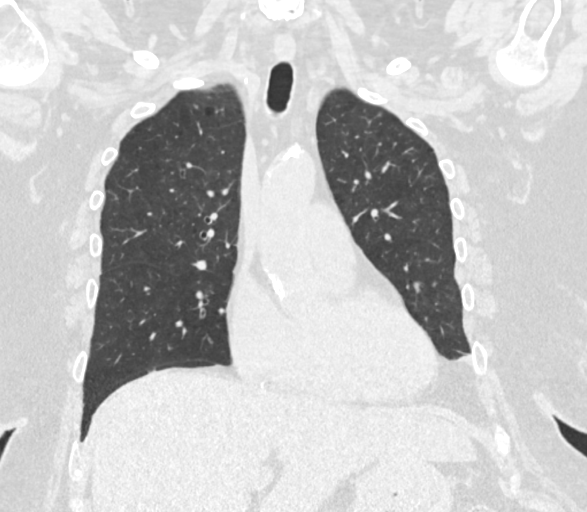
[im 206/344  lung]
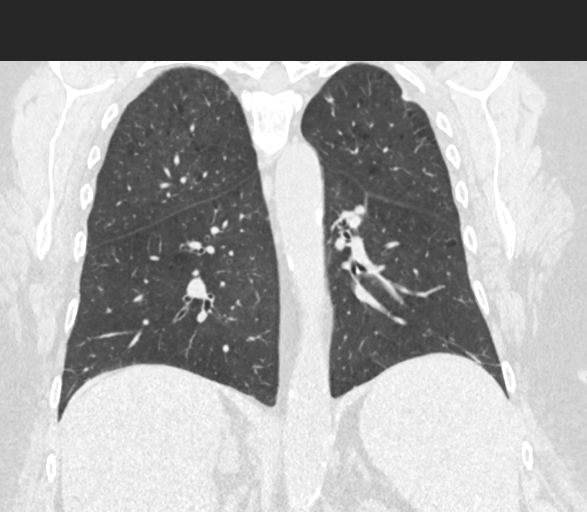

[15 of 40 positions shown; findings below may reference images not displayed]

FINDINGS: Cardiovascular: Normal heart size. No pericardial effusion. Aortic
atherosclerosis. Calcification in the left circumflex and RCA
coronary arteries.

Mediastinum/Nodes: No enlarged mediastinal, hilar, or axillary lymph
nodes. Thyroid gland, trachea, and esophagus demonstrate no
significant findings.

Lungs/Pleura: Moderate changes of centrilobular emphysema. No
pleural effusion, airspace consolidation or atelectasis. Diffuse
bronchial wall thickening. Previously characterized Lung rads
category 3 nodule in the superior segment right lower lobe has
resolved in the interval compatible with a benign nodule. Other
nodules are not significantly changed in the interval. The largest
is in the central left upper lobe with an equivalent diameter of
mm.

Upper Abdomen: No acute abnormality.

Musculoskeletal: No chest wall mass or suspicious bone lesions
identified.
IMPRESSION: 1. Lung-RADS 2, benign appearance or behavior. Continue annual
screening with low-dose chest CT without contrast in 12 months.
2. Aortic Atherosclerosis (DZJ1N-MS8.8) and Emphysema (DZJ1N-JF4.P).
3. Coronary artery calcifications.

## 2021-01-13 ENCOUNTER — Ambulatory Visit: Payer: Medicare Other

## 2021-01-14 LAB — SURGICAL PATHOLOGY

## 2021-01-16 ENCOUNTER — Observation Stay
Admission: EM | Admit: 2021-01-16 | Discharge: 2021-01-17 | Disposition: A | Discharge status: Home / Self Care | Payer: Medicare Other | Attending: Hospitalist | Admitting: Hospitalist

## 2021-01-16 ENCOUNTER — Other Ambulatory Visit: Payer: Self-pay

## 2021-01-16 DIAGNOSIS — J45909 Unspecified asthma, uncomplicated: Secondary | ICD-10-CM | POA: Insufficient documentation

## 2021-01-16 DIAGNOSIS — Z794 Long term (current) use of insulin: Secondary | ICD-10-CM | POA: Diagnosis not present

## 2021-01-16 DIAGNOSIS — J439 Emphysema, unspecified: Secondary | ICD-10-CM | POA: Diagnosis present

## 2021-01-16 DIAGNOSIS — K3189 Other diseases of stomach and duodenum: Secondary | ICD-10-CM | POA: Insufficient documentation

## 2021-01-16 DIAGNOSIS — Z87891 Personal history of nicotine dependence: Secondary | ICD-10-CM | POA: Diagnosis not present

## 2021-01-16 DIAGNOSIS — Z20822 Contact with and (suspected) exposure to covid-19: Secondary | ICD-10-CM | POA: Insufficient documentation

## 2021-01-16 DIAGNOSIS — Z7984 Long term (current) use of oral hypoglycemic drugs: Secondary | ICD-10-CM | POA: Insufficient documentation

## 2021-01-16 DIAGNOSIS — I10 Essential (primary) hypertension: Secondary | ICD-10-CM | POA: Diagnosis not present

## 2021-01-16 DIAGNOSIS — Z79899 Other long term (current) drug therapy: Secondary | ICD-10-CM | POA: Diagnosis not present

## 2021-01-16 DIAGNOSIS — E119 Type 2 diabetes mellitus without complications: Secondary | ICD-10-CM | POA: Insufficient documentation

## 2021-01-16 DIAGNOSIS — K920 Hematemesis: Secondary | ICD-10-CM | POA: Insufficient documentation

## 2021-01-16 DIAGNOSIS — J449 Chronic obstructive pulmonary disease, unspecified: Secondary | ICD-10-CM | POA: Insufficient documentation

## 2021-01-16 DIAGNOSIS — K7469 Other cirrhosis of liver: Secondary | ICD-10-CM

## 2021-01-16 DIAGNOSIS — K317 Polyp of stomach and duodenum: Secondary | ICD-10-CM | POA: Insufficient documentation

## 2021-01-16 DIAGNOSIS — R041 Hemorrhage from throat: Secondary | ICD-10-CM | POA: Diagnosis present

## 2021-01-16 DIAGNOSIS — Z7982 Long term (current) use of aspirin: Secondary | ICD-10-CM | POA: Diagnosis not present

## 2021-01-16 DIAGNOSIS — K922 Gastrointestinal hemorrhage, unspecified: Principal | ICD-10-CM | POA: Diagnosis present

## 2021-01-16 DIAGNOSIS — K766 Portal hypertension: Secondary | ICD-10-CM | POA: Insufficient documentation

## 2021-01-16 DIAGNOSIS — K7460 Unspecified cirrhosis of liver: Secondary | ICD-10-CM | POA: Diagnosis not present

## 2021-01-16 LAB — RESP PANEL BY RT-PCR (FLU A&B, COVID) ARPGX2
Influenza A by PCR: NEGATIVE
Influenza B by PCR: NEGATIVE
SARS Coronavirus 2 by RT PCR: NEGATIVE

## 2021-01-16 LAB — CBG MONITORING, ED
Glucose-Capillary: 36 mg/dL — CL (ref 70–99)
Glucose-Capillary: 137 mg/dL — ABNORMAL HIGH (ref 70–99)

## 2021-01-16 LAB — CBC
HCT: 37.4 % (ref 36.0–46.0)
Hemoglobin: 12.2 g/dL (ref 12.0–15.0)
MCH: 26.8 pg (ref 26.0–34.0)
MCHC: 32.6 g/dL (ref 30.0–36.0)
MCV: 82 fL (ref 80.0–100.0)
Platelets: 175 10*3/uL (ref 150–400)
RBC: 4.56 MIL/uL (ref 3.87–5.11)
RDW: 17 % — ABNORMAL HIGH (ref 11.5–15.5)
WBC: 7.6 10*3/uL (ref 4.0–10.5)
nRBC: 0 % (ref 0.0–0.2)

## 2021-01-16 LAB — COMPREHENSIVE METABOLIC PANEL
ALT: 16 U/L (ref 0–44)
AST: 26 U/L (ref 15–41)
Albumin: 4 g/dL (ref 3.5–5.0)
Alkaline Phosphatase: 66 U/L (ref 38–126)
Anion gap: 11 (ref 5–15)
BUN: 15 mg/dL (ref 8–23)
CO2: 26 mmol/L (ref 22–32)
Calcium: 9.4 mg/dL (ref 8.9–10.3)
Chloride: 102 mmol/L (ref 98–111)
Creatinine, Ser: 0.95 mg/dL (ref 0.44–1.00)
GFR, Estimated: >60 mL/min (ref >60)
Glucose, Bld: 90 mg/dL (ref 70–99)
Potassium: 3.6 mmol/L (ref 3.5–5.1)
Sodium: 139 mmol/L (ref 135–145)
Total Bilirubin: 0.6 mg/dL (ref 0.3–1.2)
Total Protein: 7.7 g/dL (ref 6.5–8.1)

## 2021-01-16 LAB — HEMOGLOBIN A1C
Hgb A1c MFr Bld: 6.2 % — ABNORMAL HIGH (ref 4.8–5.6)
Mean Plasma Glucose: 131.24 mg/dL

## 2021-01-16 LAB — HEMOGLOBIN AND HEMATOCRIT, BLOOD
HCT: 34.5 % — ABNORMAL LOW (ref 36.0–46.0)
Hemoglobin: 10.7 g/dL — ABNORMAL LOW (ref 12.0–15.0)

## 2021-01-16 MED ORDER — SODIUM CHLORIDE 0.9 % IV SOLN: INTRAVENOUS | Status: DC

## 2021-01-16 MED ORDER — ALBUTEROL SULFATE (2.5 MG/3ML) 0.083% IN NEBU: 3.0000 mL | INHALATION_SOLUTION | Freq: Four times a day (QID) | RESPIRATORY_TRACT | Status: DC | PRN

## 2021-01-16 MED ORDER — SODIUM CHLORIDE 0.9 % IV SOLN
50.0000 ug/h | INTRAVENOUS | Status: DC
  Administered 2021-01-16 – 2021-01-17 (×3): 50 ug/h via INTRAVENOUS
  Filled 2021-01-16 (×5): qty 1

## 2021-01-16 MED ORDER — FLUTICASONE PROPIONATE 50 MCG/ACT NA SUSP
2.0000 | Freq: Every day | NASAL | Status: DC
  Filled 2021-01-16: qty 16

## 2021-01-16 MED ORDER — ONDANSETRON HCL 4 MG PO TABS: 4.0000 mg | ORAL_TABLET | Freq: Four times a day (QID) | ORAL | Status: DC | PRN

## 2021-01-16 MED ORDER — CIPROFLOXACIN IN D5W 400 MG/200ML IV SOLN
400.0000 mg | Freq: Two times a day (BID) | INTRAVENOUS | Status: DC
  Administered 2021-01-16 – 2021-01-17 (×2): 400 mg via INTRAVENOUS
  Filled 2021-01-16 (×5): qty 200

## 2021-01-16 MED ORDER — DEXTROSE 50 % IV SOLN: 1.0000 | Freq: Once | INTRAVENOUS | Status: AC

## 2021-01-16 MED ORDER — DEXTROSE-NACL 5-0.9 % IV SOLN
INTRAVENOUS | Status: DC
Start: 2021-01-16 — End: 2021-01-17

## 2021-01-16 MED ORDER — PANTOPRAZOLE 80MG IVPB - SIMPLE MED
80.0000 mg | Freq: Once | INTRAVENOUS | Status: AC
  Administered 2021-01-16: 80 mg via INTRAVENOUS
  Filled 2021-01-16: qty 80

## 2021-01-16 MED ORDER — ONDANSETRON HCL 4 MG/2ML IJ SOLN: 4.0000 mg | Freq: Four times a day (QID) | INTRAMUSCULAR | Status: DC | PRN

## 2021-01-16 MED ORDER — DEXTROSE 50 % IV SOLN
INTRAVENOUS | Status: AC
  Administered 2021-01-16: 50 mL via INTRAVENOUS
  Filled 2021-01-16: qty 50

## 2021-01-16 MED ORDER — INSULIN ASPART 100 UNIT/ML IJ SOLN: 0.0000 [IU] | INTRAMUSCULAR | Status: DC

## 2021-01-16 MED ORDER — UMECLIDINIUM-VILANTEROL 62.5-25 MCG/INH IN AEPB
1.0000 | INHALATION_SPRAY | Freq: Every day | RESPIRATORY_TRACT | Status: DC
  Filled 2021-01-16: qty 14

## 2021-01-16 MED ORDER — PANTOPRAZOLE INFUSION (NEW) - SIMPLE MED
8.0000 mg/h | INTRAVENOUS | Status: DC
  Administered 2021-01-16 – 2021-01-17 (×2): 8 mg/h via INTRAVENOUS
  Filled 2021-01-16 (×2): qty 80

## 2021-01-16 NOTE — ED Triage Notes (Signed)
Pt was referred from GI , states she woke up this morning with blood in her mouth, they reported that the pt has a hx of varices. Pt states there is no active bleeding at this time.

## 2021-01-16 NOTE — H&P (Signed)
History and Physical    Brenda Beasley [id number] DOB: [birth year] DOA: 01/16/2021  PCP: Adin Hector, MD   Patient coming from: Home  I have personally briefly reviewed patient's old medical records in [place]  Chief Complaint: Hematemesis  HPI: Brenda Beasley is a 70 y.o. 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 Patient states that she woke up to use the bathroom at about 3:30 AM and felt she had some phlegm in her mouth that she needed to spit out and when she did it was bright red blood.  She had about 3 more episodes and then it cleared up.  She contacted her gastroenterologist who advised her to come into the ER for further evaluation. She complains of abdominal pain mostly in the left upper quadrant as well as nausea which she said is chronic but denies having any hematochezia or passing any melena stools.  She denies NSAID use. She denies having any fever, no chills, no cough, no dizziness, no lightheadedness, no urinary frequency, no nocturia, no dysuria, no chest pain, no shortness of breath, no blurred vision no focal deficit. 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  Portal hypertensive gastropathy.  Multiple gastric polyps.  Small hiatal hernia and grade 1 esophageal varices.  Widely patent Schatzki ring. Labs show sodium 139, potassium 3.6, chloride 102, bicarb 26, glucose 90, BUN 15, creatinine 0.95, calcium 9.4, alkaline phosphatase 66, albumin 4.0, AST 26, ALT 16, total protein 7.7, white count 7.6, hemoglobin 12.2, hematocrit 37.4, MCV 82.0, RDW 17.0, platelet count 175      ED Course: Patient is a 70 year old female with a history of liver cirrhosis secondary to NASH with complications of portal  hypertensive gastropathy and esophageal varices who presents to the ER for evaluation of several episodes of hematemesis. She was started on IV Protonix and octreotide in the ER and received a dose of ciprofloxacin for SBP prophylaxis She will be referred to observation status for further evaluation.   Review of Systems: As per HPI otherwise all other systems reviewed and negative.    Past Medical History:  Diagnosis Date   Anxiety    Asthma    Chronic pruritus    Cirrhosis of liver (HCC)    COPD (chronic obstructive pulmonary disease) ([place])    Diabetes mellitus without complication (HCC)    GERD (gastroesophageal reflux disease)    Hematuria, microscopic    History of uterine fibroid    History of ventral hernia    Hyperlipidemia    Hypertension    IBS (irritable bowel syndrome)    IDA (iron deficiency anemia) 11/13/2020   Osteoporosis with current pathological fracture 11/03/2018   Personal history of tobacco use, presenting hazards to health 09/20/2015   Proteinuria    Stroke ([place])    Tobacco abuse 10/04/2014   Vitamin B 12 deficiency    Vitamin D deficiency     Past Surgical History:  Procedure Laterality Date   COLON SURGERY     COLONOSCOPY N/A 01/09/2021   Procedure: COLONOSCOPY;  Surgeon: Annamaria Helling, DO;  Location: [facility] ENDOSCOPY;  Service: Gastroenterology;  Laterality: N/A;   COLONOSCOPY WITH PROPOFOL N/A 11/23/2018   Procedure: COLONOSCOPY WITH PROPOFOL;  Surgeon: Toledo, Benay Pike, MD;  Location: [facility] ENDOSCOPY;  Service: Gastroenterology;  Laterality: N/A;   ESOPHAGOGASTRODUODENOSCOPY N/A 01/09/2021   Procedure: ESOPHAGOGASTRODUODENOSCOPY (EGD);  Surgeon: Annamaria Helling, DO;  Location: [facility] ENDOSCOPY;  Service: Gastroenterology;  Laterality: N/A;   ESOPHAGOGASTRODUODENOSCOPY (EGD) WITH PROPOFOL N/A 11/23/2018   Procedure: ESOPHAGOGASTRODUODENOSCOPY (EGD) WITH PROPOFOL;  Surgeon: Toledo, Benay Pike, MD;  Location: [facility] ENDOSCOPY;  Service:  Gastroenterology;  Laterality: N/A;   HERNIA REPAIR     PARTIAL COLECTOMY     TONSILLECTOMY     VENTRAL HERNIA REPAIR       reports that she quit smoking about 2 years ago. Her smoking use included cigarettes. She has a 52.00 pack-year smoking history. She has never used smokeless tobacco. She reports that she does not currently use drugs. She reports that she does not drink alcohol.  Allergies  Allergen Reactions   Molds & Smuts Shortness Of Breath   Other Shortness Of Breath and Hives    Cats hives Dust causes sneezing Affects breathing Tape-unknown   Penicillins Hives and Swelling    Throat swell  Has patient had a PCN reaction causing immediate rash, facial/tongue/throat swelling, SOB or lightheadedness with hypotension: Yes Has patient had a PCN reaction causing severe rash involving mucus membranes or skin necrosis: Unknown Has patient had a PCN reaction that required hospitalization: Unknown Has patient had a PCN reaction occurring within the last 10 years: Unknown If all of the above answers are "NO", then may proceed with Cephalosporin use.    Pioglitazone Other (See Comments) and Palpitations    Chest pain, numbness in fingers,    Byetta 10 Mcg Pen [Exenatide] Other (See Comments)    Severe stomach pains   Dapagliflozin Other (See Comments)    Intolerant/dyspnea   Jardiance [Empagliflozin] Other (See Comments)   Victoza [Liraglutide] Other (See Comments)    Severe stomach pains   Sulfa Antibiotics Rash   Sulfasalazine Rash    Family History  Problem Relation Age of Onset   Diabetes Mother    Diabetes Father    Diabetes Sister    Diabetes Maternal Grandmother    Diabetes Paternal Grandmother       Prior to Admission medications   Medication Sig Start Date End Date Taking? Authorizing Provider  albuterol (VENTOLIN HFA) 108 (90 Base) MCG/ACT inhaler Inhale 2 puffs into the lungs every 6 (six) hours as needed. 03/05/15   [provider]  amLODipine  (NORVASC) 5 MG tablet Take 2.5 mg by mouth daily.  03/05/15   [provider]  Celedonio Miyamoto 62.5-25 MCG/INH AEPB Take 1 Inhaler by mouth daily. 09/08/17   [provider]  aspirin EC 81 MG tablet Take 81 mg by mouth daily.    [provider]  CALCIUM PO Take 1 tablet by mouth every evening.    [provider]  Cholecalciferol (VITAMIN D3) 5000 units TABS Take 5,000 Units by mouth daily.     [provider]  cyanocobalamin (,VITAMIN B-12,) 1000 MCG/ML injection Inject 1,000 mcg into the muscle every 30 (thirty) days. 10/23/14   [provider]  Cyanocobalamin-Methylcobalamin 10000 (B12) MCG/2ML LIQD Inject into the muscle every 30 (thirty) days.    [provider]  doxycycline (VIBRAMYCIN) 100 MG capsule Take 1 capsule (100 mg total) by mouth 2 (two) times daily. Patient not taking: Reported on 11/12/2020 11/26/19   Versie Starks, PA-C  ferrous sulfate 325 (65 FE) MG tablet Take 325 mg by mouth daily. 03/15/15   [provider]  fluticasone (FLONASE) 50 MCG/ACT nasal spray Place 2 sprays  into the nose daily. 03/05/15   [provider]  gabapentin (NEURONTIN) 100 MG capsule Take 100 mg by mouth at bedtime as needed.    [provider]  glipiZIDE (GLUCOTROL) 10 MG tablet Take 20 mg 2 (two) times daily by mouth.  03/15/15   [provider]  hydrochlorothiazide (HYDRODIURIL) 25 MG tablet Take 12.5 mg by mouth daily.  03/05/15   [provider]  HYDROcodone-homatropine (HYCODAN) 5-1.5 MG/5ML syrup Take 5 mLs by mouth every 6 (six) hours as needed for cough. 10/17/17   Hinda Kehr, MD  Insulin Glargine, 2 Unit Dial, (TOUJEO MAX SOLOSTAR) 300 UNIT/ML SOPN Inject 300 Units/mL into the skin at bedtime.    [provider]  ipratropium-albuterol (DUONEB) 0.5-2.5 (3) MG/3ML SOLN Take 3 mLs 4 (four) times daily as needed by nebulization.    [provider]  lisinopril (PRINIVIL,ZESTRIL) 40  MG tablet Take 20 mg by mouth daily.  03/05/15   [provider]  lovastatin (MEVACOR) 40 MG tablet Take 2 tablets by mouth daily. 03/05/15   [provider]  MAGNESIUM PO Take 1 tablet by mouth at bedtime.     [provider]  metFORMIN (GLUCOPHAGE) 1000 MG tablet Take 1,000 mg by mouth 2 (two) times daily. 03/05/15   [provider]  naproxen (NAPROSYN) 375 MG tablet Take 1 tablet (375 mg total) by mouth 2 (two) times daily with a meal. 02/20/18   Baity, Coralie Keens, NP  omeprazole (PRILOSEC) 20 MG capsule Take 20 mg by mouth 2 (two) times daily. 03/05/15   [provider]  ondansetron (ZOFRAN ODT) 4 MG disintegrating tablet Take 1 tablet (4 mg total) by mouth every 8 (eight) hours as needed. 06/24/18   Gregor Hams, MD  vitamin C (ASCORBIC ACID) 250 MG tablet Take 250 mg by mouth daily.    [provider]    Physical Exam: Vitals:   01/16/21 1241 01/16/21 1244  BP: (!) 169/79   Pulse: 84   Resp: 16   Temp: 98.6 F (37 C)   TempSrc: Oral   SpO2: 96%   Height:  '4\' 11"'$  (1.499 m)     Vitals:   01/16/21 1241 01/16/21 1244  BP: (!) 169/79   Pulse: 84   Resp: 16   Temp: 98.6 F (37 C)   TempSrc: Oral   SpO2: 96%   Height:  '4\' 11"'$  (1.499 m)      Constitutional: Alert and oriented x 3 . Not in any apparent distress HEENT:      Head: Normocephalic and atraumatic.         Eyes: PERLA, EOMI, Conjunctivae are normal. Sclera is non-icteric.       Mouth/Throat: Mucous membranes are moist.       Neck: Supple with no signs of meningismus. Cardiovascular: Regular rate and rhythm. No murmurs, gallops, or rubs. 2+ symmetrical distal pulses are present . No JVD. No LE edema Respiratory: Respiratory effort normal .Lungs sounds clear bilaterally. No wheezes, crackles, or rhonchi.  Gastrointestinal: Soft, tender left upper quadrant, and non distended with positive bowel sounds.  Genitourinary: No CVA tenderness. Musculoskeletal:  Nontender with normal range of motion in all extremities. No cyanosis, or erythema of extremities. Neurologic:  Face is symmetric. Moving all extremities. No gross focal neurologic deficits . Skin: Skin is warm, dry.  No rash or ulcers Psychiatric: Mood and affect are normal    Labs on Admission: I have personally reviewed following labs and imaging studies  CBC:  Recent Labs  Lab 01/16/21 1323  WBC 7.6  HGB 12.2  HCT 37.4  MCV 82.0  PLT 0000000   Basic Metabolic Panel: Recent Labs  Lab 01/16/21 1323  NA 139  K 3.6  CL 102  CO2 26  GLUCOSE 90  BUN 15  CREATININE 0.95  CALCIUM 9.4   GFR: Estimated Creatinine Clearance: 47.8 mL/min (by C-G formula based on SCr of 0.95 mg/dL). Liver Function Tests: Recent Labs  Lab 01/16/21 1323  AST 26  ALT 16  ALKPHOS 66  BILITOT 0.6  PROT 7.7  ALBUMIN 4.0   No results for input(s): LIPASE, AMYLASE in the last 168 hours. No results for input(s): AMMONIA in the last 168 hours. Coagulation Profile: No results for input(s): INR, PROTIME in the last 168 hours. Cardiac Enzymes: No results for input(s): CKTOTAL, CKMB, CKMBINDEX, TROPONINI in the last 168 hours. BNP (last 3 results) No results for input(s): PROBNP in the last 8760 hours. HbA1C: No results for input(s): HGBA1C in the last 72 hours. CBG: No results for input(s): GLUCAP in the last 168 hours. Lipid Profile: No results for input(s): CHOL, HDL, LDLCALC, TRIG, CHOLHDL, LDLDIRECT in the last 72 hours. Thyroid Function Tests: No results for input(s): TSH, T4TOTAL, FREET4, T3FREE, THYROIDAB in the last 72 hours. Anemia Panel: No results for input(s): VITAMINB12, FOLATE, FERRITIN, TIBC, IRON, RETICCTPCT in the last 72 hours. Urine analysis:    Component Value Date/Time   COLORURINE YELLOW (A) 06/23/2018 1937   APPEARANCEUR HAZY (A) 06/23/2018 1937   APPEARANCEUR CLEAR 07/20/2011 0005   LABSPEC 1.021 06/23/2018 1937   LABSPEC 1.021 07/20/2011 0005   PHURINE 5.0  06/23/2018 1937   GLUCOSEU >=500 (A) 06/23/2018 1937   GLUCOSEU 500 mg/dL 07/20/2011 0005   HGBUR NEGATIVE 06/23/2018 1937   BILIRUBINUR NEGATIVE 06/23/2018 1937   BILIRUBINUR NEGATIVE 07/20/2011 0005   KETONESUR 5 (A) 06/23/2018 1937   PROTEINUR 30 (A) 06/23/2018 1937   NITRITE NEGATIVE 06/23/2018 1937   LEUKOCYTESUR NEGATIVE 06/23/2018 1937   LEUKOCYTESUR NEGATIVE 07/20/2011 0005    Radiological Exams on Admission: No results found.   Assessment/Plan Principal Problem:   GI bleed Active Problems:   COPD with emphysema ([place])   Essential hypertension   Diabetes mellitus without complication ([place])   Cirrhosis of liver ([place])     GI bleed Patient with a history of liver cirrhosis with complications of portal hypertensive gastropathy who presents to the ER for evaluation of several episodes of hematemesis. Concern for possible variceal bleed Check serial H&H Continue IV PPI, octreotide and IV ciprofloxacin for SBP prophylaxis Consult gastroenterology for further evaluation     Diabetes mellitus without complication Patient is n.p.o. for possible upper endoscopy Hold oral hypoglycemic agents Blood sugar checks every 4 hours    History of COPD Not acutely exacerbated Continue as needed bronchodilator therapy as well as inhaled steroids    Essential hypertension Hold oral hypertensive agents for now Monitor blood pressure closely  DVT prophylaxis: SCD  Code Status: full code  Family Communication: Greater than 50% of time was spent discussing patient's condition and plan of care with her at the bedside.  All questions and concerns have been addressed.  She verbalizes understanding and agrees with the plan. Disposition Plan: Back to previous home environment Consults called: Gastroenterology Status: Observation    Giordana Weinheimer MD [facility]     01/16/2021, 3:17 PM

## 2021-01-16 NOTE — ED Notes (Signed)
Pt complaining on pins and needles sensation in her L arm- pt grip strength equal bilateral, no swelling or redness noted at IV site- pt states arm also cold, cap refill less than 3 seconds- warm blanket provided and sandostatin paused

## 2021-01-16 NOTE — ED Notes (Signed)
Pt given denture cup and warm blanket

## 2021-01-16 NOTE — ED Notes (Signed)
Dr. Paduchowski at bedside.  

## 2021-01-16 NOTE — ED Provider Notes (Signed)
[facility] Emergency Department Provider Note  Time seen: 1:15 PM  I have reviewed the triage vital signs and the nursing notes.   HISTORY  Chief Complaint oral bleeding   HPI Brenda Beasley is a 70 y.o. female with a past medical history of anxiety, liver cirrhosis, diabetes, COPD, hypertension, hyperlipidemia, esophageal varices, presents to the emergency department for oral bleeding.  According to the patient around 330 this morning she woke up to use the restroom.  States she felt like she had sinus drainage and her mouth and throat so she spit but states it was bright red blood.  States she did this several more times waited approximately half hour an hour and did it again but it was only light pink the last time.  Patient called her GI doctor this morning who recommended she come to the emergency department for further work-up.  Patient states some nausea today but states that has been unchanged for the last several weeks.  Denies any abdominal pain.  Denies any black or bloody stool.  Patient does have ongoing GI bleeding, had an endoscopy and colonoscopy performed last week by Dr. Virgina Jock of GI medicine for the same.   Past Medical History:  Diagnosis Date   Anxiety    Asthma    Chronic pruritus    Cirrhosis of liver (HCC)    COPD (chronic obstructive pulmonary disease) ([place])    Diabetes mellitus without complication (HCC)    GERD (gastroesophageal reflux disease)    Hematuria, microscopic    History of uterine fibroid    History of ventral hernia    Hyperlipidemia    Hypertension    IBS (irritable bowel syndrome)    IDA (iron deficiency anemia) 11/13/2020   Osteoporosis with current pathological fracture 11/03/2018   Personal history of tobacco use, presenting hazards to health 09/20/2015   Proteinuria    Stroke ([place])    Tobacco abuse 10/04/2014   Vitamin B 12 deficiency    Vitamin D deficiency     Patient Active Problem List   Diagnosis Date  Noted   IDA (iron deficiency anemia) 11/13/2020   B12 deficiency 04/14/2017   Drug-induced urticaria 04/14/2017   GERD (gastroesophageal reflux disease) 04/14/2017   Hyperlipidemia, mixed 04/14/2017   Microscopic hematuria 04/14/2017   Uncontrolled type 2 diabetes mellitus with complication, without long-term current use of insulin ([place]) 04/14/2017   Vitamin D deficiency, unspecified 04/14/2017   Colitis 04/02/2017   Age-related osteoporosis without current pathological fracture 11/13/2016   Thoracic aortic atherosclerosis ([place]) 01/07/2016   Facet arthritis of lumbar region 12/19/2015   Personal history of tobacco use, presenting hazards to health 09/20/2015   COPD with emphysema ([place]) 09/03/2015   Essential hypertension 09/03/2015   Proteinuria 03/05/2015   Tobacco abuse 10/04/2014   DDD (degenerative disc disease), lumbar 04/05/2014   Greater trochanteric bursitis of both hips 04/05/2014   Lumbar radiculopathy 04/05/2014    Past Surgical History:  Procedure Laterality Date   COLON SURGERY     COLONOSCOPY N/A 01/09/2021   Procedure: COLONOSCOPY;  Surgeon: Annamaria Helling, DO;  Location: [facility] ENDOSCOPY;  Service: Gastroenterology;  Laterality: N/A;   COLONOSCOPY WITH PROPOFOL N/A 11/23/2018   Procedure: COLONOSCOPY WITH PROPOFOL;  Surgeon: Toledo, Benay Pike, MD;  Location: [facility] ENDOSCOPY;  Service: Gastroenterology;  Laterality: N/A;   ESOPHAGOGASTRODUODENOSCOPY N/A 01/09/2021   Procedure: ESOPHAGOGASTRODUODENOSCOPY (EGD);  Surgeon: Annamaria Helling, DO;  Location: [facility] ENDOSCOPY;  Service: Gastroenterology;  Laterality: N/A;   ESOPHAGOGASTRODUODENOSCOPY (EGD)  WITH PROPOFOL N/A 11/23/2018   Procedure: ESOPHAGOGASTRODUODENOSCOPY (EGD) WITH PROPOFOL;  Surgeon: Toledo, Benay Pike, MD;  Location: [facility] ENDOSCOPY;  Service: Gastroenterology;  Laterality: N/A;   HERNIA REPAIR     PARTIAL COLECTOMY     TONSILLECTOMY     VENTRAL HERNIA REPAIR      Prior to Admission medications    Medication Sig Start Date End Date Taking? Authorizing Provider  albuterol (VENTOLIN HFA) 108 (90 Base) MCG/ACT inhaler Inhale 2 puffs into the lungs every 6 (six) hours as needed. 03/05/15   [provider]  amLODipine (NORVASC) 5 MG tablet Take 2.5 mg by mouth daily.  03/05/15   [provider]  Celedonio Miyamoto 62.5-25 MCG/INH AEPB Take 1 Inhaler by mouth daily. 09/08/17   [provider]  aspirin EC 81 MG tablet Take 81 mg by mouth daily.    [provider]  CALCIUM PO Take 1 tablet by mouth every evening.    [provider]  Cholecalciferol (VITAMIN D3) 5000 units TABS Take 5,000 Units by mouth daily.     [provider]  cyanocobalamin (,VITAMIN B-12,) 1000 MCG/ML injection Inject 1,000 mcg into the muscle every 30 (thirty) days. 10/23/14   [provider]  Cyanocobalamin-Methylcobalamin 10000 (B12) MCG/2ML LIQD Inject into the muscle every 30 (thirty) days.    [provider]  doxycycline (VIBRAMYCIN) 100 MG capsule Take 1 capsule (100 mg total) by mouth 2 (two) times daily. Patient not taking: Reported on 11/12/2020 11/26/19   Versie Starks, PA-C  ferrous sulfate 325 (65 FE) MG tablet Take 325 mg by mouth daily. 03/15/15   [provider]  fluticasone (FLONASE) 50 MCG/ACT nasal spray Place 2 sprays into the nose daily. 03/05/15   [provider]  gabapentin (NEURONTIN) 100 MG capsule Take 100 mg by mouth at bedtime as needed.    [provider]  glipiZIDE (GLUCOTROL) 10 MG tablet Take 20 mg 2 (two) times daily by mouth.  03/15/15   [provider]  hydrochlorothiazide (HYDRODIURIL) 25 MG tablet Take 12.5 mg by mouth daily.  03/05/15   [provider]  HYDROcodone-homatropine (HYCODAN) 5-1.5 MG/5ML syrup Take 5 mLs by mouth every 6 (six) hours as needed for cough. 10/17/17   Hinda Kehr, MD  Insulin Glargine, 2 Unit Dial, (TOUJEO MAX SOLOSTAR) 300 UNIT/ML SOPN Inject 300 Units/mL  into the skin at bedtime.    [provider]  ipratropium-albuterol (DUONEB) 0.5-2.5 (3) MG/3ML SOLN Take 3 mLs 4 (four) times daily as needed by nebulization.    [provider]  lisinopril (PRINIVIL,ZESTRIL) 40 MG tablet Take 20 mg by mouth daily.  03/05/15   [provider]  loperamide (IMODIUM) 2 MG capsule Take 1 capsule (2 mg total) every 6 (six) hours as needed by mouth for diarrhea or loose stools. 04/05/17   Dustin Flock, MD  lovastatin (MEVACOR) 40 MG tablet Take 2 tablets by mouth daily. 03/05/15   [provider]  MAGNESIUM PO Take 1 tablet by mouth at bedtime.     [provider]  metFORMIN (GLUCOPHAGE) 1000 MG tablet Take 1,000 mg by mouth 2 (two) times daily. 03/05/15   [provider]  naproxen (NAPROSYN) 375 MG tablet Take 1 tablet (375 mg total) by mouth 2 (two) times daily with a meal. 02/20/18   Baity, Coralie Keens, NP  omeprazole (PRILOSEC) 20 MG capsule Take 20 mg by mouth 2 (two) times daily. 03/05/15   [provider]  ondansetron (ZOFRAN ODT) 4  MG disintegrating tablet Take 1 tablet (4 mg total) by mouth every 8 (eight) hours as needed. 06/24/18   Gregor Hams, MD  vitamin C (ASCORBIC ACID) 250 MG tablet Take 250 mg by mouth daily.    [provider]    Allergies  Allergen Reactions   Molds & Smuts Shortness Of Breath   Other Shortness Of Breath and Hives    Cats hives Dust causes sneezing Affects breathing Tape-unknown   Penicillins Hives and Swelling    Throat swell  Has patient had a PCN reaction causing immediate rash, facial/tongue/throat swelling, SOB or lightheadedness with hypotension: Yes Has patient had a PCN reaction causing severe rash involving mucus membranes or skin necrosis: Unknown Has patient had a PCN reaction that required hospitalization: Unknown Has patient had a PCN reaction occurring within the last 10 years: Unknown If all of the above answers are "NO", then may  proceed with Cephalosporin use.    Pioglitazone Other (See Comments) and Palpitations    Chest pain, numbness in fingers,    Byetta 10 Mcg Pen [Exenatide] Other (See Comments)    Severe stomach pains   Dapagliflozin Other (See Comments)    Intolerant/dyspnea   Jardiance [Empagliflozin] Other (See Comments)   Victoza [Liraglutide] Other (See Comments)    Severe stomach pains   Sulfa Antibiotics Rash   Sulfasalazine Rash    Family History  Problem Relation Age of Onset   Diabetes Mother    Diabetes Father    Diabetes Sister    Diabetes Maternal Grandmother    Diabetes Paternal Grandmother     Social History Social History   Tobacco Use   Smoking status: Former    Packs/day: 1.00    Years: 52.00    Pack years: 52.00    Types: Cigarettes    Quit date: 06/12/2018    Years since quitting: 2.6   Smokeless tobacco: Never  Vaping Use   Vaping Use: Never used  Substance Use Topics   Alcohol use: No    Alcohol/week: 0.0 standard drinks   Drug use: Not Currently    Review of Systems Constitutional: Negative for fever. Cardiovascular: Negative for chest pain. Respiratory: Negative for shortness of breath. Gastrointestinal: Negative for abdominal pain.  Positive for nausea, chronic. Genitourinary: Negative for urinary compaints Musculoskeletal: Negative for musculoskeletal complaints Neurological: Negative for headache All other ROS negative  ____________________________________________   PHYSICAL EXAM:  VITAL SIGNS: ED Triage Vitals  Enc Vitals Group     BP 01/16/21 1241 (!) 169/79     Pulse Rate 01/16/21 1241 84     Resp 01/16/21 1241 16     Temp 01/16/21 1241 98.6 F (37 C)     Temp Source 01/16/21 1241 Oral     SpO2 01/16/21 1241 96 %     Weight --      Height 01/16/21 1244 '4\' 11"'$  (1.499 m)     Head Circumference --      Peak Flow --      Pain Score --      Pain Loc --      Pain Edu? --      Excl. in [place]? --    Constitutional: Alert and oriented. Well  appearing and in no distress. Eyes: Normal exam ENT      Head: Normocephalic and atraumatic.      Mouth/Throat: Mucous membranes are moist. Cardiovascular: Normal rate, regular rhythm. No murmurs, rubs, or gallops. Respiratory: Normal respiratory effort without tachypnea nor retractions. Breath  sounds are clear and equal bilaterally. No wheezes/rales/rhonchi. Gastrointestinal: Soft and nontender. No distention.  Musculoskeletal: Nontender with normal range of motion in all extremities. Neurologic:  Normal speech and language. No gross focal neurologic deficits  Skin:  Skin is warm, dry and intact.  Psychiatric: Mood and affect are normal.     INITIAL IMPRESSION / ASSESSMENT AND PLAN / ED COURSE  Pertinent labs & imaging results that were available during my care of the patient were reviewed by me and considered in my medical decision making (see chart for details).   Patient presents to the emergency department after spitting up blood several times overnight around 330 this morning.  Currently the patient appears well, nontender abdomen.  Lab work is pending.  Patient had a recent EGD with multiple findings.  I spoke to Dr. Marius Ditch of GI medicine who recommends we start the patient on Protonix, octreotide, Rocephin and admit to the hospitalist service with plan to do another EGD to ensure that she is not having any upper GI bleeding.  Spoke to the patient regarding this plan of care and she is agreeable as well.  [name] labs are largely within normal limits.  We will admit to the hospitalist service for further work-up treatment and likely EGD by Dr. Marius Ditch.  Brenda Beasley was evaluated in Emergency Department on 01/16/2021 for the symptoms described in the history of present illness. She was evaluated in the context of the global COVID-19 pandemic, which necessitated consideration that the patient might be at risk for infection with the SARS-CoV-2 virus that causes COVID-19. Institutional  protocols and algorithms that pertain to the evaluation of patients at risk for COVID-19 are in a state of rapid change based on information released by regulatory bodies including the CDC and federal and state organizations. These policies and algorithms were followed during the patient's care in the ED.  ____________________________________________   FINAL CLINICAL IMPRESSION(S) / ED DIAGNOSES  Upper GI bleed   Harvest Dark, MD 01/16/21 1426

## 2021-01-16 NOTE — ED Notes (Signed)
Brenda Beasley [phone] cell- pt has given permission to give this person updates

## 2021-01-16 NOTE — Consult Note (Signed)
Cephas Darby, MD [address]  [place]  [place], [place] [address]  Main: [phone]  Fax: [phone] Pager: [phone]   Consultation  Referring Provider:     No ref. provider found Primary Care Physician:  Adin Hector, MD Primary Gastroenterologist:  Dr. Virgina Jock         Reason for Consultation:     Hematemesis  Date of Admission:  01/16/2021 Date of Consultation:  01/16/2021         HPI:   Brenda Beasley is a 70 y.o. female with history of diabetes, stroke, COPD, Karlene Lineman cirrhosis presented to the ER today as she woke up with blood in her mouth, that she had 2 episodes and then cleared up. There were on her toilet paper. She contacted her primary gastroenterologist who advised her to go to the ER.  Patient also reports nausea, left upper quadrant discomfort.  She denies any melena, rectal bleeding, hematochezia.  She denies fever, chills.  Patient recently underwent upper endoscopy as well as colonoscopy by Dr. 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  Labs on admission revealed hemoglobin 12.2, BUN/creatinine 15/0.95, normal LFTs, normal platelets.  Given her history of cirrhosis and known esophageal varices, patient was started on octreotide drip, pantoprazole drip and ceftriaxone for SBP prophylaxis and GI is consulted for further evaluation.   NSAIDs: None  Antiplts/Anticoagulants/Anti thrombotics: None  GI Procedures: Reviewed under procedures tab  Past Medical History:  Diagnosis Date   Anxiety    Asthma    Chronic pruritus    Cirrhosis of liver ([place])    COPD (chronic obstructive pulmonary disease) ([place])    Diabetes mellitus without complication (HCC)    GERD (gastroesophageal reflux disease)    Hematuria, microscopic     History of uterine fibroid    History of ventral hernia    Hyperlipidemia    Hypertension    IBS (irritable bowel syndrome)    IDA (iron deficiency anemia) 11/13/2020   Osteoporosis with current pathological fracture 11/03/2018   Personal history of tobacco use, presenting hazards to health 09/20/2015   Proteinuria    Stroke ([place])    Tobacco abuse 10/04/2014   Vitamin B 12 deficiency    Vitamin D deficiency     Past Surgical History:  Procedure Laterality Date   COLON SURGERY     COLONOSCOPY N/A 01/09/2021   Procedure: COLONOSCOPY;  Surgeon: Annamaria Helling, DO;  Location: [facility] ENDOSCOPY;  Service: Gastroenterology;  Laterality: N/A;   COLONOSCOPY WITH PROPOFOL N/A 11/23/2018   Procedure: COLONOSCOPY WITH PROPOFOL;  Surgeon: Toledo, Benay Pike, MD;  Location: [facility] ENDOSCOPY;  Service: Gastroenterology;  Laterality: N/A;   ESOPHAGOGASTRODUODENOSCOPY N/A 01/09/2021   Procedure: ESOPHAGOGASTRODUODENOSCOPY (EGD);  Surgeon: Annamaria Helling, DO;  Location: [facility] ENDOSCOPY;  Service: Gastroenterology;  Laterality: N/A;   ESOPHAGOGASTRODUODENOSCOPY (EGD) WITH PROPOFOL N/A 11/23/2018   Procedure: ESOPHAGOGASTRODUODENOSCOPY (EGD) WITH PROPOFOL;  Surgeon: Toledo, Benay Pike, MD;  Location: [facility] ENDOSCOPY;  Service: Gastroenterology;  Laterality: N/A;   HERNIA REPAIR     PARTIAL COLECTOMY     TONSILLECTOMY     VENTRAL HERNIA REPAIR      Prior to Admission medications   Medication Sig Start Date End Date Taking? Authorizing Provider  amLODipine (NORVASC) 5 MG  tablet Take 2.5 mg by mouth daily.  03/05/15  Yes [provider]  Cholecalciferol (VITAMIN D3) 5000 units TABS Take 5,000 Units by mouth daily.    Yes [provider]  cyanocobalamin (,VITAMIN B-12,) 1000 MCG/ML injection Inject 1,000 mcg into the muscle every 30 (thirty) days. 10/23/14  Yes [provider]  glipiZIDE (GLUCOTROL) 10 MG tablet Take 20 mg 2 (two) times daily by mouth.  03/15/15  Yes [provider]  hydrochlorothiazide (HYDRODIURIL) 25 MG tablet Take 25 mg by mouth daily. 03/05/15  Yes [provider]  Insulin Glargine, 2 Unit Dial, (TOUJEO MAX SOLOSTAR) 300 UNIT/ML SOPN Inject 300 Units/mL into the skin at bedtime.   Yes [provider]  lisinopril (PRINIVIL,ZESTRIL) 40 MG tablet Take 20 mg by mouth daily.  03/05/15  Yes [provider]  lovastatin (MEVACOR) 40 MG tablet Take 2 tablets by mouth daily. 03/05/15  Yes [provider]  metFORMIN (GLUCOPHAGE) 1000 MG tablet Take 1,000 mg by mouth 2 (two) times daily. 03/05/15  Yes [provider]  omeprazole (PRILOSEC) 20 MG capsule Take 20 mg by mouth 2 (two) times daily. 03/05/15  Yes [provider]  albuterol (VENTOLIN HFA) 108 (90 Base) MCG/ACT inhaler Inhale 2 puffs into the lungs every 6 (six) hours as needed. 03/05/15   [provider]  Celedonio Miyamoto 62.5-25 MCG/INH AEPB Take 1 Inhaler by mouth daily. Patient not taking: Reported on 01/16/2021 09/08/17   [provider]  aspirin EC 81 MG tablet Take 81 mg by mouth daily.    [provider]  CALCIUM PO Take 1 tablet by mouth every evening.    [provider]  Cyanocobalamin-Methylcobalamin 10000 (B12) MCG/2ML LIQD Inject into the muscle every 30 (thirty) days. Patient not taking: No sig reported    [provider]  doxycycline (VIBRAMYCIN) 100 MG capsule Take 1 capsule (100 mg total) by mouth 2 (two) times daily. Patient not taking: No sig reported 11/26/19   Versie Starks, PA-C  ferrous sulfate 325 (65 FE) MG tablet Take 325 mg by mouth daily. 03/15/15   [provider]  fluticasone (FLONASE) 50 MCG/ACT nasal spray Place 2 sprays into the nose daily. 03/05/15   [provider]  gabapentin (NEURONTIN) 100 MG capsule Take 100 mg by mouth at bedtime as needed.    [provider]  HYDROcodone-homatropine (HYCODAN) 5-1.5 MG/5ML syrup Take 5 mLs by mouth  every 6 (six) hours as needed for cough. Patient not taking: No sig reported 10/17/17   Hinda Kehr, MD  ipratropium-albuterol (DUONEB) 0.5-2.5 (3) MG/3ML SOLN Take 3 mLs 4 (four) times daily as needed by nebulization.    [provider]  MAGNESIUM PO Take 1 tablet by mouth at bedtime.     [provider]  naproxen (NAPROSYN) 375 MG tablet Take 1 tablet (375 mg total) by mouth 2 (two) times daily with a meal. Patient not taking: No sig reported 02/20/18   Jearld Fenton, NP  ondansetron (ZOFRAN ODT) 4 MG disintegrating tablet Take 1 tablet (4 mg total) by mouth every 8 (eight) hours as needed. Patient not taking: No sig reported 06/24/18   Gregor Hams, MD  vitamin C (ASCORBIC ACID) 250 MG tablet Take 250 mg by mouth daily.    [provider]    Current Facility-Administered Medications:    albuterol (PROVENTIL) (2.5 MG/3ML) 0.083% nebulizer solution 3 mL, 3 mL, Nebulization, Q6H PRN, Agbata, Tochukwu, MD   ciprofloxacin (CIPRO) IVPB 400  mg, 400 mg, Intravenous, Q12H, Harvest Dark, MD, Stopped at 01/16/21 1505   dextrose 5 %-0.9 % sodium chloride infusion, , Intravenous, Continuous, Agbata, Tochukwu, MD, Last Rate: 75 mL/hr at 01/16/21 1640, New Bag at 01/16/21 1640   fluticasone (FLONASE) 50 MCG/ACT nasal spray 2 spray, 2 spray, Each Nare, Daily, Agbata, Tochukwu, MD   octreotide (SANDOSTATIN) 500 mcg in sodium chloride 0.9 % 250 mL (2 mcg/mL) infusion, 50 mcg/hr, Intravenous, Continuous, Harvest Dark, MD, Last Rate: 25 mL/hr at 01/16/21 1512, 50 mcg/hr at 01/16/21 1512   ondansetron (ZOFRAN) tablet 4 mg, 4 mg, Oral, Q6H PRN **OR** ondansetron (ZOFRAN) injection 4 mg, 4 mg, Intravenous, Q6H PRN, Agbata, Tochukwu, MD   pantoprozole (PROTONIX) 80 mg /NS 100 mL infusion, 8 mg/hr, Intravenous, Continuous, Paduchowski, Kevin, MD   umeclidinium-vilanterol (ANORO ELLIPTA) 62.5-25 MCG/INH 1 puff, 1 puff, Inhalation, Daily, Agbata, Tochukwu, MD  Current  Outpatient Medications:    amLODipine (NORVASC) 5 MG tablet, Take 2.5 mg by mouth daily. , Disp: , Rfl:    Cholecalciferol (VITAMIN D3) 5000 units TABS, Take 5,000 Units by mouth daily. , Disp: , Rfl:    cyanocobalamin (,VITAMIN B-12,) 1000 MCG/ML injection, Inject 1,000 mcg into the muscle every 30 (thirty) days., Disp: , Rfl:    glipiZIDE (GLUCOTROL) 10 MG tablet, Take 20 mg 2 (two) times daily by mouth. , Disp: , Rfl:    hydrochlorothiazide (HYDRODIURIL) 25 MG tablet, Take 25 mg by mouth daily., Disp: , Rfl:    Insulin Glargine, 2 Unit Dial, (TOUJEO MAX SOLOSTAR) 300 UNIT/ML SOPN, Inject 300 Units/mL into the skin at bedtime., Disp: , Rfl:    lisinopril (PRINIVIL,ZESTRIL) 40 MG tablet, Take 20 mg by mouth daily. , Disp: , Rfl:    lovastatin (MEVACOR) 40 MG tablet, Take 2 tablets by mouth daily., Disp: , Rfl:    metFORMIN (GLUCOPHAGE) 1000 MG tablet, Take 1,000 mg by mouth 2 (two) times daily., Disp: , Rfl:    omeprazole (PRILOSEC) 20 MG capsule, Take 20 mg by mouth 2 (two) times daily., Disp: , Rfl:    albuterol (VENTOLIN HFA) 108 (90 Base) MCG/ACT inhaler, Inhale 2 puffs into the lungs every 6 (six) hours as needed., Disp: , Rfl:    ANORO ELLIPTA 62.5-25 MCG/INH AEPB, Take 1 Inhaler by mouth daily. (Patient not taking: Reported on 01/16/2021), Disp: , Rfl: 4   aspirin EC 81 MG tablet, Take 81 mg by mouth daily., Disp: , Rfl:    CALCIUM PO, Take 1 tablet by mouth every evening., Disp: , Rfl:    Cyanocobalamin-Methylcobalamin 10000 (B12) MCG/2ML LIQD, Inject into the muscle every 30 (thirty) days. (Patient not taking: No sig reported), Disp: , Rfl:    doxycycline (VIBRAMYCIN) 100 MG capsule, Take 1 capsule (100 mg total) by mouth 2 (two) times daily. (Patient not taking: No sig reported), Disp: 28 capsule, Rfl: 0   ferrous sulfate 325 (65 FE) MG tablet, Take 325 mg by mouth daily., Disp: , Rfl:    fluticasone (FLONASE) 50 MCG/ACT nasal spray, Place 2 sprays into the nose daily., Disp: , Rfl:     gabapentin (NEURONTIN) 100 MG capsule, Take 100 mg by mouth at bedtime as needed., Disp: , Rfl:    HYDROcodone-homatropine (HYCODAN) 5-1.5 MG/5ML syrup, Take 5 mLs by mouth every 6 (six) hours as needed for cough. (Patient not taking: No sig reported), Disp: 120 mL, Rfl: 0   ipratropium-albuterol (DUONEB) 0.5-2.5 (3) MG/3ML SOLN, Take 3 mLs 4 (four) times daily as needed by nebulization., Disp: ,  Rfl:    MAGNESIUM PO, Take 1 tablet by mouth at bedtime. , Disp: , Rfl:    naproxen (NAPROSYN) 375 MG tablet, Take 1 tablet (375 mg total) by mouth 2 (two) times daily with a meal. (Patient not taking: No sig reported), Disp: 14 tablet, Rfl: 0   ondansetron (ZOFRAN ODT) 4 MG disintegrating tablet, Take 1 tablet (4 mg total) by mouth every 8 (eight) hours as needed. (Patient not taking: No sig reported), Disp: 20 tablet, Rfl: 0   vitamin C (ASCORBIC ACID) 250 MG tablet, Take 250 mg by mouth daily., Disp: , Rfl:   Family History  Problem Relation Age of Onset   Diabetes Mother    Diabetes Father    Diabetes Sister    Diabetes Maternal Grandmother    Diabetes Paternal Grandmother      Social History   Tobacco Use   Smoking status: Former    Packs/day: 1.00    Years: 52.00    Pack years: 52.00    Types: Cigarettes    Quit date: 06/12/2018    Years since quitting: 2.6   Smokeless tobacco: Never  Vaping Use   Vaping Use: Never used  Substance Use Topics   Alcohol use: No    Alcohol/week: 0.0 standard drinks   Drug use: Not Currently    Allergies as of 01/16/2021 - Review Complete 01/16/2021  Allergen Reaction Noted   Molds & smuts Shortness Of Breath 09/29/2013   Other Shortness Of Breath and Hives 11/04/2015   Penicillins Hives and Swelling 11/04/2015   Pioglitazone Other (See Comments) and Palpitations 12/05/2015   Byetta 10 mcg pen [exenatide] Other (See Comments) 11/04/2015   Dapagliflozin Other (See Comments) 11/03/2016   Jardiance [empagliflozin] Other (See Comments) 11/22/2018    Victoza [liraglutide] Other (See Comments) 11/04/2015   Sulfa antibiotics Rash 11/04/2015   Sulfasalazine Rash 11/22/2018    Review of Systems:    All systems reviewed and negative except where noted in HPI.   Physical Exam:  Vital signs in last 24 hours: Temp:  [98.6 F (37 C)] 98.6 F (37 C) (09/01 1241) Pulse Rate:  [84] 84 (09/01 1241) Resp:  [16] 16 (09/01 1241) BP: (169)/(79) 169/79 (09/01 1241) SpO2:  [96 %] 96 % (09/01 1241)   General:   Pleasant, cooperative in NAD Head:  Normocephalic and atraumatic. Eyes:   No icterus.   Conjunctiva pink. PERRLA. Ears:  Normal auditory acuity. Neck:  Supple; no masses or thyroidomegaly Lungs: Respirations even and unlabored. Lungs clear to auscultation bilaterally.   No wheezes, crackles, or rhonchi.  Heart:  Regular rate and rhythm;  Without murmur, clicks, rubs or gallops Abdomen:  Soft, nondistended, nontender. Normal bowel sounds. No appreciable masses or hepatomegaly.  No rebound or guarding.  Rectal:  Not performed. Msk:  Symmetrical without gross deformities.  Strength normal  Extremities:  Without edema, cyanosis or clubbing. Neurologic:  Alert and oriented x3;  grossly normal neurologically. Skin:  Intact without significant lesions or rashes. Psych:  Alert and cooperative. Normal affect.  LAB RESULTS: CBC Latest Ref Rng & Units 01/16/2021 12/24/2020 06/23/2018  WBC 4.0 - 10.5 K/uL 7.6 5.6 11.3(H)  Hemoglobin 12.0 - 15.0 g/dL 12.2 10.6(L) 11.5(L)  Hematocrit 36.0 - 46.0 % 37.4 33.7(L) 36.6  Platelets 150 - 400 K/uL 175 144(L) 209    BMET BMP Latest Ref Rng & Units 01/16/2021 06/23/2018 04/05/2017  Glucose 70 - 99 mg/dL 90 160(H) 184(H)  BUN 8 - 23 mg/dL 15 20 12  Creatinine 0.44 - 1.00 mg/dL 0.95 0.97 0.87  Sodium 135 - 145 mmol/L 139 136 135  Potassium 3.5 - 5.1 mmol/L 3.6 4.2 3.8  Chloride 98 - 111 mmol/L 102 98 107  CO2 22 - 32 mmol/L _0 Calcium 8.9 - 10.3 mg/dL 9.4 9.3 8.5(L)    LFT Hepatic Function Latest  Ref Rng & Units 01/16/2021 06/23/2018 04/02/2017  Total Protein 6.5 - 8.1 g/dL 7.7 7.1 7.1  Albumin 3.5 - 5.0 g/dL 4.0 4.1 3.3(L)  AST 15 - 41 U/L _1 ALT 0 - 44 U/L 16 23 11(L)  Alk Phosphatase 38 - 126 U/L 66 61 73  Total Bilirubin 0.3 - 1.2 mg/dL 0.6 0.8 0.6     STUDIES: No results found.    Impression / Plan:   Brenda Beasley is a 70 y.o. female with history of diabetes, hypertension, COPD, Karlene Lineman cirrhosis, Crohn's disease s/p ileocolonic resection and anastomosis presented with small volume hematemesis with no evidence of significant GI bleed.  Patient recently underwent upper endoscopy on 8/25 who was found to have small esophageal varices, portal hypertension gastropathy, gastric polyps and one of the large gastric polyp was resected and clip was placed.  Currently patient is hemodynamically stable with no active GI bleed at this time.  Hemoglobin is within normal limits Given history of hematemesis although it's small volume, and recent removal of gastric polyp, recommend upper endoscopy for further evaluation Since patient is hemodynamically stable, will perform upper endoscopy tomorrow Continue octreotide drip, pantoprazole drip and ceftriaxone for SBP prophylaxis N.p.o. effective 5 AM Liquid diet today Patient to follow-up with outpatient [facility] upon discharge  Thank you for involving me in the care of this patient.  GI will follow along with you    LOS: 0 days   Sherri Sear, MD  01/16/2021, 6:50 PM    Note: This dictation was prepared with Dragon dictation along with smaller phrase technology. Any transcriptional errors that result from this process are unintentional.

## 2021-01-16 NOTE — ED Notes (Signed)
Pt given meal tray.

## 2021-01-16 NOTE — ED Notes (Signed)
Pt states L arm feeling better- sandostatin restarted

## 2021-01-17 ENCOUNTER — Encounter
Admission: EM | Disposition: A | Discharge status: Home / Self Care | Payer: Self-pay | Source: Home / Self Care | Attending: Emergency Medicine

## 2021-01-17 ENCOUNTER — Observation Stay: Payer: Medicare Other | Admitting: Anesthesiology

## 2021-01-17 ENCOUNTER — Encounter: Payer: Self-pay | Admitting: Gastroenterology

## 2021-01-17 DIAGNOSIS — K920 Hematemesis: Secondary | ICD-10-CM | POA: Diagnosis not present

## 2021-01-17 DIAGNOSIS — R040 Epistaxis: Secondary | ICD-10-CM

## 2021-01-17 DIAGNOSIS — Z01810 Encounter for preprocedural cardiovascular examination: Secondary | ICD-10-CM

## 2021-01-17 DIAGNOSIS — K922 Gastrointestinal hemorrhage, unspecified: Secondary | ICD-10-CM | POA: Diagnosis not present

## 2021-01-17 HISTORY — PX: ESOPHAGOGASTRODUODENOSCOPY (EGD) WITH PROPOFOL: SHX5813

## 2021-01-17 LAB — HEMOGLOBIN AND HEMATOCRIT, BLOOD
HCT: 32.5 % — ABNORMAL LOW (ref 36.0–46.0)
Hemoglobin: 10.4 g/dL — ABNORMAL LOW (ref 12.0–15.0)

## 2021-01-17 LAB — TYPE AND SCREEN
ABO/RH(D): O POS
Antibody Screen: NEGATIVE

## 2021-01-17 LAB — GLUCOSE, CAPILLARY
Glucose-Capillary: 105 mg/dL — ABNORMAL HIGH (ref 70–99)
Glucose-Capillary: 119 mg/dL — ABNORMAL HIGH (ref 70–99)
Glucose-Capillary: 126 mg/dL — ABNORMAL HIGH (ref 70–99)
Glucose-Capillary: 152 mg/dL — ABNORMAL HIGH (ref 70–99)
Glucose-Capillary: 53 mg/dL — ABNORMAL LOW (ref 70–99)
Glucose-Capillary: 55 mg/dL — ABNORMAL LOW (ref 70–99)
Glucose-Capillary: 86 mg/dL (ref 70–99)
Glucose-Capillary: 94 mg/dL (ref 70–99)

## 2021-01-17 LAB — TROPONIN I (HIGH SENSITIVITY): Troponin I (High Sensitivity): 6 ng/L (ref <18)

## 2021-01-17 SURGERY — ESOPHAGOGASTRODUODENOSCOPY (EGD) WITH PROPOFOL
Anesthesia: General

## 2021-01-17 MED ORDER — HYDROCHLOROTHIAZIDE 25 MG PO TABS: ORAL_TABLET | ORAL | Status: AC

## 2021-01-17 MED ORDER — LIDOCAINE HCL (CARDIAC) PF 100 MG/5ML IV SOSY
PREFILLED_SYRINGE | INTRAVENOUS | Status: DC | PRN
  Administered 2021-01-17: 80 mg via INTRAVENOUS

## 2021-01-17 MED ORDER — PROPOFOL 10 MG/ML IV BOLUS
INTRAVENOUS | Status: DC | PRN
  Administered 2021-01-17: 60 mg via INTRAVENOUS

## 2021-01-17 MED ORDER — LISINOPRIL 40 MG PO TABS: ORAL_TABLET | ORAL | Status: AC

## 2021-01-17 MED ORDER — SODIUM CHLORIDE 0.9 % IV SOLN: INTRAVENOUS | Status: DC

## 2021-01-17 MED ORDER — DEXTROSE 50 % IV SOLN: 50.0000 mL | INTRAVENOUS | Status: DC | PRN

## 2021-01-17 MED ORDER — PROPOFOL 500 MG/50ML IV EMUL
INTRAVENOUS | Status: DC | PRN
  Administered 2021-01-17: 150 ug/kg/min via INTRAVENOUS

## 2021-01-17 MED ORDER — DEXTROSE 50 % IV SOLN: 12.5000 g | INTRAVENOUS | Status: DC | PRN

## 2021-01-17 MED ORDER — DEXTROSE 10 % IV SOLN: INTRAVENOUS | Status: DC

## 2021-01-17 NOTE — Progress Notes (Signed)
Pt c/o 5 out of 10 chest pressure, nonradiating. MD made aware, orders for EKG and stat troponins. VSS. Will continue to monitor.

## 2021-01-17 NOTE — Transfer of Care (Signed)
Immediate Anesthesia Transfer of Care Note  Patient: Brenda Beasley  Procedure(s) Performed: ESOPHAGOGASTRODUODENOSCOPY (EGD) WITH PROPOFOL  Patient Location: Endoscopy Unit  Anesthesia Type:General  Level of Consciousness: drowsy  Airway & Oxygen Therapy: Patient Spontanous Breathing  Post-op Assessment: Report given to RN and Post -op Vital signs reviewed and stable  Post vital signs: Reviewed and stable  Last Vitals:  Vitals Value Taken Time  BP    Temp    Pulse 98 01/17/21 1513  Resp 17 01/17/21 1513  SpO2 93 % 01/17/21 1513  Vitals shown include unvalidated device data.  Last Pain:  Vitals:   01/17/21 0751  TempSrc: Other (Comment)  PainSc:       Patients Stated Pain Goal: 0 ([id number])  Complications: No notable events documented.

## 2021-01-17 NOTE — Anesthesia Preprocedure Evaluation (Signed)
Anesthesia Evaluation  Patient identified by MRN, date of birth, ID band Patient awake  General Assessment Comment:  Patient denies hematemesis or vomiting blood; she says she woke up with bloody sputum on her. Has not vomited. Endorses a chronic nausea.  Reviewed: Allergy & Precautions, H&P , NPO status , [name] Chart, lab work & pertinent test results  History of Anesthesia Complications Negative for: history of anesthetic complications  Airway Mallampati: II  TM Distance: >3 FB Neck ROM: Full    Dental  (+) Edentulous Upper, Edentulous Lower   Pulmonary shortness of breath and with exertion, asthma , neg sleep apnea, COPD,  COPD inhaler, neg recent URI, Patient abstained from smoking.Not current smoker, former smoker,    breath sounds clear to auscultation       Cardiovascular hypertension, (-) angina(-) Past MI and (-) Cardiac Stents (-) dysrhythmias (-) Valvular Problems/Murmurs Rhythm:Regular Rate:Normal     Neuro/Psych neg Seizures Anxiety CVA negative psych ROS   GI/Hepatic Neg liver ROS, GERD  Controlled,  Endo/Other  diabetes  Renal/GU negative Renal ROS  negative genitourinary   Musculoskeletal   Abdominal   Peds  Hematology negative hematology ROS (+)   Anesthesia Other Findings Past Medical History: No date: Anxiety No date: Asthma No date: Chronic pruritus No date: Cirrhosis of liver (HCC) No date: COPD (chronic obstructive pulmonary disease) (HCC) No date: Diabetes mellitus without complication (HCC) No date: GERD (gastroesophageal reflux disease) No date: Hematuria, microscopic No date: History of uterine fibroid No date: History of ventral hernia No date: Hyperlipidemia No date: Hypertension No date: IBS (irritable bowel syndrome) 11/03/2018: Osteoporosis with current pathological fracture 09/20/2015: Personal history of tobacco use, presenting hazards to  health No date: Proteinuria No  date: Stroke ([place]) 10/04/2014: Tobacco abuse No date: Vitamin B 12 deficiency No date: Vitamin D deficiency  Past Surgical History: No date: COLON SURGERY No date: HERNIA REPAIR No date: PARTIAL COLECTOMY No date: TONSILLECTOMY No date: VENTRAL HERNIA REPAIR  BMI    Body Mass Index: 33.76 kg/m      Reproductive/Obstetrics negative OB ROS                             Anesthesia Physical  Anesthesia Plan  ASA: 3  Anesthesia Plan: General   Post-op Pain Management:    Induction: Intravenous  PONV Risk Score and Plan: 3 and Propofol infusion and TIVA  Airway Management Planned: Natural Airway and Nasal Cannula  Additional Equipment: None  Intra-op Plan:   Post-operative Plan:   Informed Consent: I have reviewed the patients History and Physical, chart, labs and discussed the procedure including the risks, benefits and alternatives for the proposed anesthesia with the patient or authorized representative who has indicated his/her understanding and acceptance.     Dental Advisory Given  Plan Discussed with: Anesthesiologist and CRNA  Anesthesia Plan Comments: (Discussed risks of anesthesia with patient, including possibility of difficulty with spontaneous ventilation under anesthesia necessitating airway intervention, PONV, and rare risks such as cardiac or respiratory or neurological events, aspiration, and allergic reactions. Patient understands.)        Anesthesia Quick Evaluation

## 2021-01-17 NOTE — Progress Notes (Signed)
Discharge instructions explained to pt and daughter/both verbalized understanding. IV and tele removed. Will transport off unit via wheelchair.

## 2021-01-17 NOTE — Discharge Summary (Signed)
Physician Discharge Summary   Brenda Beasley  female DOB: 05-31-1950  [id number]  PCP: Adin Hector, MD  Admit date: 01/16/2021 Discharge date: 01/17/2021  Admitted From: home Disposition:  home Family updated at bedside prior to discharge.  CODE STATUS: Full code  Discharge Instructions     Discharge instructions   Complete by: As directed    Your upper endoscopy found no bleeding.  Please follow up with GI clinic with Dr Virgina Jock as previously scheduled.  Your blood pressure was intermittently low, so I have held your blood pressure medications for now, pending follow up with your PCP.  Your blood sugars were low on initial presentation.  Your A1c is 6.2, which is likely too low for your age.  Please follow up with your PCP to adjust your insulin.  I did stop your glipizide which is likely what was causing your low blood sugars.     Dr. Enzo Bi [facility] Course:  For full details, please see H&P, progress notes, consult notes and ancillary notes.  Briefly,  Brenda Beasley is a 70 y.o. 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  None witness since presentation.  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  Portal hypertensive gastropathy.  Multiple gastric polyps.  Small hiatal hernia and grade 1 esophageal varices.  Widely patent Schatzki ring.  GI bleed, ruled out Likely an episode of epistaxis  --No signs of bleeding since presentation.  Hgb stable ~10.  EGD found no bleeding.  Pt was cleared for discharge by GI. --Pt reported that she woke up to use the bathroom at about 3:30 AM and felt she had some phlegm in her mouth that she needed to spit out and when she did it was  bright red blood.  She had about 3 more episodes and then it cleared up. Upon further questioning, history was consistent with epistaxis that bled into the back of her throat during sleep. --pt to continue followup with outpatient GI Dr. Virgina Jock.   Diabetes mellitus without complication Hypoglycemia, POA --A1c 6.2, which is too low for pt's age group.  Home glipizide was discontinued due to it's risk for hypoglycemia.  Pt was advised to follow up with PCP for further adjustment of home insulin, with goal A1c of ~7.5.   History of COPD Not acutely exacerbated Continue as needed bronchodilator therapy as well as inhaled steroids   Essential hypertension BP intermittently low.  Home BP meds held pending PCP followup.   Discharge Diagnoses:  Principal Problem:   GI bleed Active Problems:   COPD with emphysema ([place])   Essential hypertension   Diabetes mellitus without complication ([place])   Cirrhosis of liver ([place])   Hematemesis with nausea     Discharge Instructions:  Allergies as of 01/17/2021       Reactions   Molds & Smuts Shortness Of Breath   Other Shortness Of Breath, Hives   Cats hives Dust causes sneezing Affects breathing Tape-unknown   Penicillins Hives, Swelling   Throat swell Has patient had a PCN reaction causing immediate rash, facial/tongue/throat swelling, SOB or lightheadedness with hypotension: Yes Has patient had a PCN reaction causing severe rash involving mucus membranes or skin necrosis: Unknown Has  patient had a PCN reaction that required hospitalization: Unknown Has patient had a PCN reaction occurring within the last 10 years: Unknown If all of the above answers are "NO", then may proceed with Cephalosporin use.   Pioglitazone Other (See Comments), Palpitations   Chest pain, numbness in fingers,    Byetta 10 Mcg Pen [exenatide] Other (See Comments)   Severe stomach pains   Dapagliflozin Other (See Comments)   Intolerant/dyspnea   Jardiance  [empagliflozin] Other (See Comments)   Victoza [liraglutide] Other (See Comments)   Severe stomach pains   Sulfa Antibiotics Rash   Sulfasalazine Rash        Medication List     STOP taking these medications    amLODipine 5 MG tablet Commonly known as: NORVASC   Anoro Ellipta 62.5-25 MCG/INH Aepb Generic drug: umeclidinium-vilanterol   Cyanocobalamin-Methylcobalamin 10000 (B12) MCG/2ML Liqd   doxycycline 100 MG capsule Commonly known as: VIBRAMYCIN   glipiZIDE 10 MG tablet Commonly known as: GLUCOTROL   HYDROcodone-homatropine 5-1.5 MG/5ML syrup Commonly known as: HYCODAN   naproxen 375 MG tablet Commonly known as: NAPROSYN   ondansetron 4 MG disintegrating tablet Commonly known as: Zofran ODT       TAKE these medications    albuterol 108 (90 Base) MCG/ACT inhaler Commonly known as: VENTOLIN HFA Inhale 2 puffs into the lungs every 6 (six) hours as needed.   aspirin EC 81 MG tablet Take 81 mg by mouth daily.   CALCIUM PO Take 1 tablet by mouth every evening.   cyanocobalamin 1000 MCG/ML injection Commonly known as: (VITAMIN B-12) Inject 1,000 mcg into the muscle every 30 (thirty) days.   ferrous sulfate 325 (65 FE) MG tablet Take 325 mg by mouth daily.   fluticasone 50 MCG/ACT nasal spray Commonly known as: FLONASE Place 2 sprays into the nose daily.   gabapentin 100 MG capsule Commonly known as: NEURONTIN Take 100 mg by mouth at bedtime as needed.   hydrochlorothiazide 25 MG tablet Commonly known as: HYDRODIURIL Hold until followup with outpatient doctor due to intermittent low blood pressure. What changed:  how much to take how to take this when to take this additional instructions   ipratropium-albuterol 0.5-2.5 (3) MG/3ML Soln Commonly known as: DUONEB Take 3 mLs 4 (four) times daily as needed by nebulization.   lisinopril 40 MG tablet Commonly known as: ZESTRIL Hold until followup with outpatient doctor due to intermittent low  blood pressure. What changed:  how much to take how to take this when to take this additional instructions   lovastatin 40 MG tablet Commonly known as: MEVACOR Take 2 tablets by mouth daily.   MAGNESIUM PO Take 1 tablet by mouth at bedtime.   metFORMIN 1000 MG tablet Commonly known as: GLUCOPHAGE Take 1,000 mg by mouth 2 (two) times daily.   omeprazole 20 MG capsule Commonly known as: PRILOSEC Take 20 mg by mouth 2 (two) times daily.   Toujeo Max SoloStar 300 UNIT/ML Solostar Pen Generic drug: insulin glargine (2 Unit Dial) Inject 300 Units/mL into the skin at bedtime.   vitamin C 250 MG tablet Commonly known as: ASCORBIC ACID Take 250 mg by mouth daily.   Vitamin D3 125 MCG (5000 UT) Tabs Take 5,000 Units by mouth daily.         Follow-up Information     Tama High III, MD Follow up in 1 week(s).   Specialty: Internal Medicine Contact information: [place] [address] [phone]  Your outpatient GI doctor Follow up.                  Allergies  Allergen Reactions   Molds & Smuts Shortness Of Breath   Other Shortness Of Breath and Hives    Cats hives Dust causes sneezing Affects breathing Tape-unknown   Penicillins Hives and Swelling    Throat swell  Has patient had a PCN reaction causing immediate rash, facial/tongue/throat swelling, SOB or lightheadedness with hypotension: Yes Has patient had a PCN reaction causing severe rash involving mucus membranes or skin necrosis: Unknown Has patient had a PCN reaction that required hospitalization: Unknown Has patient had a PCN reaction occurring within the last 10 years: Unknown If all of the above answers are "NO", then may proceed with Cephalosporin use.    Pioglitazone Other (See Comments) and Palpitations    Chest pain, numbness in fingers,    Byetta 10 Mcg Pen [Exenatide] Other (See Comments)    Severe stomach pains   Dapagliflozin Other (See Comments)     Intolerant/dyspnea   Jardiance [Empagliflozin] Other (See Comments)   Victoza [Liraglutide] Other (See Comments)    Severe stomach pains   Sulfa Antibiotics Rash   Sulfasalazine Rash     The results of significant diagnostics from this hospitalization (including imaging, microbiology, ancillary and laboratory) are listed below for reference.   Consultations:   Procedures/Studies: No results found.    Labs: BNP (last 3 results) No results for input(s): BNP in the last 8760 hours. Basic Metabolic Panel: Recent Labs  Lab 01/16/21 1323  NA 139  K 3.6  CL 102  CO2 26  GLUCOSE 90  BUN 15  CREATININE 0.95  CALCIUM 9.4   Liver Function Tests: Recent Labs  Lab 01/16/21 1323  AST 26  ALT 16  ALKPHOS 66  BILITOT 0.6  PROT 7.7  ALBUMIN 4.0   No results for input(s): LIPASE, AMYLASE in the last 168 hours. No results for input(s): AMMONIA in the last 168 hours. CBC: Recent Labs  Lab 01/16/21 1323 01/16/21 2002 01/17/21 0554  WBC 7.6  --   --   HGB 12.2 10.7* 10.4*  HCT 37.4 34.5* 32.5*  MCV 82.0  --   --   PLT 175  --   --    Cardiac Enzymes: No results for input(s): CKTOTAL, CKMB, CKMBINDEX, TROPONINI in the last 168 hours. BNP: Invalid input(s): POCBNP CBG: Recent Labs  Lab 01/17/21 0747 01/17/21 1143 01/17/21 1417 01/17/21 1519 01/17/21 1615  GLUCAP 119* 152* 126* 105* 94   D-Dimer No results for input(s): DDIMER in the last 72 hours. Hgb A1c Recent Labs    01/16/21 2002  HGBA1C 6.2*   Lipid Profile No results for input(s): CHOL, HDL, LDLCALC, TRIG, CHOLHDL, LDLDIRECT in the last 72 hours. Thyroid function studies No results for input(s): TSH, T4TOTAL, T3FREE, THYROIDAB in the last 72 hours.  Invalid input(s): FREET3 Anemia work up No results for input(s): VITAMINB12, FOLATE, FERRITIN, TIBC, IRON, RETICCTPCT in the last 72 hours. Urinalysis    Component Value Date/Time   COLORURINE YELLOW (A) 06/23/2018 1937   APPEARANCEUR HAZY (A)  06/23/2018 1937   APPEARANCEUR CLEAR 07/20/2011 0005   LABSPEC 1.021 06/23/2018 1937   LABSPEC 1.021 07/20/2011 0005   PHURINE 5.0 06/23/2018 1937   GLUCOSEU >=500 (A) 06/23/2018 1937   GLUCOSEU 500 mg/dL 07/20/2011 0005   HGBUR NEGATIVE 06/23/2018 1937   BILIRUBINUR NEGATIVE 06/23/2018 1937   BILIRUBINUR NEGATIVE 07/20/2011 0005   KETONESUR 5 (  A) 06/23/2018 1937   PROTEINUR 30 (A) 06/23/2018 1937   NITRITE NEGATIVE 06/23/2018 1937   LEUKOCYTESUR NEGATIVE 06/23/2018 1937   LEUKOCYTESUR NEGATIVE 07/20/2011 0005   Sepsis Labs Invalid input(s): PROCALCITONIN,  WBC,  LACTICIDVEN Microbiology Recent Results (from the past 240 hour(s))  Resp Panel by RT-PCR (Flu A&B, Covid) Nasopharyngeal Swab     Status: None   Collection Time: 01/16/21  4:12 PM   Specimen: Nasopharyngeal Swab; Nasopharyngeal(NP) swabs in vial transport medium  Result Value Ref Range Status   SARS Coronavirus 2 by RT PCR NEGATIVE NEGATIVE Final    Comment: (NOTE) SARS-CoV-2 target nucleic acids are NOT DETECTED.  The SARS-CoV-2 RNA is generally detectable in upper respiratory specimens during the acute phase of infection. The lowest concentration of SARS-CoV-2 viral copies this assay can detect is 138 copies/mL. A negative result does not preclude SARS-Cov-2 infection and should not be used as the sole basis for treatment or other patient management decisions. A negative result may occur with  improper specimen collection/handling, submission of specimen other than nasopharyngeal swab, presence of viral mutation(s) within the areas targeted by this assay, and inadequate number of viral copies(<138 copies/mL). A negative result must be combined with clinical observations, patient history, and epidemiological information. The expected result is Negative.  Fact Sheet for Patients:  EntrepreneurPulse.com.au  Fact Sheet for Healthcare Providers:  IncredibleEmployment.be  This  test is no t yet approved or cleared by the [place] FDA and  has been authorized for detection and/or diagnosis of SARS-CoV-2 by FDA under an Emergency Use Authorization (EUA). This EUA will remain  in effect (meaning this test can be used) for the duration of the COVID-19 declaration under Section 564(b)(1) of the Act, 21 U.S.C.section 360bbb-3(b)(1), unless the authorization is terminated  or revoked sooner.       Influenza A by PCR NEGATIVE NEGATIVE Final   Influenza B by PCR NEGATIVE NEGATIVE Final    Comment: (NOTE) The Xpert Xpress SARS-CoV-2/FLU/RSV plus assay is intended as an aid in the diagnosis of influenza from Nasopharyngeal swab specimens and should not be used as a sole basis for treatment. Nasal washings and aspirates are unacceptable for Xpert Xpress SARS-CoV-2/FLU/RSV testing.  Fact Sheet for Patients: EntrepreneurPulse.com.au  Fact Sheet for Healthcare Providers: IncredibleEmployment.be  This test is not yet approved or cleared by the [place] FDA and has been authorized for detection and/or diagnosis of SARS-CoV-2 by FDA under an Emergency Use Authorization (EUA). This EUA will remain in effect (meaning this test can be used) for the duration of the COVID-19 declaration under Section 564(b)(1) of the Act, 21 U.S.C. section 360bbb-3(b)(1), unless the authorization is terminated or revoked.  Performed at [facility], [address] [place], [place] [address]      Total time spend on discharging this patient, including the last patient exam, discussing the hospital stay, instructions for ongoing care as it relates to all pertinent caregivers, as well as preparing the medical discharge records, prescriptions, and/or referrals as applicable, is 50 minutes.    Enzo Bi, MD  [facility] 01/17/2021, 4:22 PM

## 2021-01-17 NOTE — Anesthesia Postprocedure Evaluation (Signed)
Anesthesia Post Note  Patient: Brenda Beasley  Procedure(s) Performed: ESOPHAGOGASTRODUODENOSCOPY (EGD) WITH PROPOFOL  Patient location during evaluation: PACU Anesthesia Type: General Level of consciousness: awake and alert Pain management: pain level controlled Vital Signs Assessment: post-procedure vital signs reviewed and stable Respiratory status: spontaneous breathing, nonlabored ventilation, respiratory function stable and patient connected to nasal cannula oxygen Cardiovascular status: blood pressure returned to baseline and stable Postop Assessment: no apparent nausea or vomiting Anesthetic complications: no   No notable events documented.   Last Vitals:  Vitals:   01/17/21 1415 01/17/21 1513  BP: (!) 156/84 (!) 109/48  Pulse: 78   Resp:    Temp: 37.1 C 36.8 C  SpO2: 96%     Last Pain:  Vitals:   01/17/21 1533  TempSrc:   PainSc: 0-No pain                 Molli Barrows

## 2021-01-17 NOTE — Op Note (Signed)
[facility] Gastroenterology Patient Name: Brenda Beasley Procedure Date: 01/17/2021 2:54 PM MRN: [id number] Account #: [id number] Date of Birth: [birth year] Admit Type: Emergency Department Age: 70 Room: [facility] ENDO ROOM 3 Gender: Female Note Status: Finalized Instrument Name: Altamese Cabal Endoscope [id number] Procedure:             Upper GI endoscopy Indications:           Hematemesis Providers:             Lin Landsman MD, MD Medicines:             General Anesthesia Complications:         No immediate complications. Estimated blood loss: None. Procedure:             Pre-Anesthesia Assessment:                        - Prior to the procedure, a History and Physical was                         performed, and patient medications and allergies were                         reviewed. The patient is competent. The risks and                         benefits of the procedure and the sedation options and                         risks were discussed with the patient. All questions                         were answered and informed consent was obtained.                         Patient identification and proposed procedure were                         verified by the physician, the nurse, the                         anesthesiologist, the anesthetist and the technician                         in the pre-procedure area in the procedure room in the                         endoscopy suite. Mental Status Examination: alert and                         oriented. Airway Examination: normal oropharyngeal                         airway and neck mobility. Respiratory Examination:                         clear to auscultation. CV Examination: normal.                         Prophylactic Antibiotics:  The patient does not require                         prophylactic antibiotics. Prior Anticoagulants: The                         patient has taken no previous anticoagulant or                          antiplatelet agents. ASA Grade Assessment: III - A                         patient with severe systemic disease. After reviewing                         the risks and benefits, the patient was deemed in                         satisfactory condition to undergo the procedure. The                         anesthesia plan was to use general anesthesia.                         Immediately prior to administration of medications,                         the patient was re-assessed for adequacy to receive                         sedatives. The heart rate, respiratory rate, oxygen                         saturations, blood pressure, adequacy of pulmonary                         ventilation, and response to care were monitored                         throughout the procedure. The physical status of the                         patient was re-assessed after the procedure.                        After obtaining informed consent, the endoscope was                         passed under direct vision. Throughout the procedure,                         the patient's blood pressure, pulse, and oxygen                         saturations were monitored continuously. The Endoscope                         was introduced through the mouth, and advanced to the  second part of duodenum. The upper GI endoscopy was                         accomplished without difficulty. The patient tolerated                         the procedure fairly well. Findings:      The duodenal bulb and second portion of the duodenum were normal. Two       areas of previously treated AVMs found and non bleeding      Mild, diffuse portal hypertensive gastropathy was found in the entire       examined stomach.      Multiple sessile polyps with no bleeding and no stigmata of recent       bleeding were found in the gastric body. These were already sampled       during previous EGD      The gastroesophageal junction and  examined esophagus were normal. Impression:            - Normal duodenal bulb and second portion of the                         duodenum.                        - Portal hypertensive gastropathy.                        - Multiple gastric polyps.                        - Normal gastroesophageal junction and esophagus.                        - No specimens collected. Recommendation:        - Return patient to hospital ward for possible                         discharge same day.                        - Cardiac diet and diabetic (ADA) diet today.                        - Continue present medications.                        - Discontinue PPI, octroetide drips, ceftriaxone                        - Return to GI clinic with Dr Virgina Jock as previously                         scheduled. Procedure Code(s):     --- Professional ---                        [phone], Esophagogastroduodenoscopy, flexible,                         transoral; diagnostic, including collection of  specimen(s) by brushing or washing, when performed                         (separate procedure) Diagnosis Code(s):     --- Professional ---                        K76.6, Portal hypertension                        K31.89, Other diseases of stomach and duodenum                        K31.7, Polyp of stomach and duodenum                        K92.0, Hematemesis CPT copyright 2019 American Medical Association. All rights reserved. The codes documented in this report are preliminary and upon coder review may  be revised to meet current compliance requirements. Dr. Ulyess Mort Lin Landsman MD, MD 01/17/2021 3:11:29 PM This report has been signed electronically. Number of Addenda: 0 Note Initiated On: 01/17/2021 2:54 PM Estimated Blood Loss:  Estimated blood loss: none.      [facility]

## 2021-01-17 NOTE — Progress Notes (Signed)
Patient arrived from ED via stretcher with transport RN. Handoff completed with Elana G.,RN. Patient in no acute distress at arrival. Patient ambulated from stretcher to bed in room with standby assist. Skin and telemetry secondary verifications completed with Wendy,RN. Patient oriented to room and surroundings.Bed placed in lowest, locked position.Call bell placed in reach, patient encouraged to call out with any needs.

## 2021-01-17 NOTE — Progress Notes (Signed)
Octreotide paused d/t PIV occlusion. PT has other IV meds infusing that are not compatible. New PIV insertion attempted twice-unsuccessful. IV team consulted for second PIV. Will continue to monitor.

## 2021-01-30 ENCOUNTER — Ambulatory Visit: Payer: Medicare Other

## 2021-01-31 ENCOUNTER — Other Ambulatory Visit: Payer: Self-pay

## 2021-01-31 ENCOUNTER — Ambulatory Visit
Admission: RE | Admit: 2021-01-31 | Discharge: 2021-01-31 | Disposition: A | Discharge status: Home / Self Care | Payer: Medicare Other | Source: Ambulatory Visit | Attending: Gastroenterology | Admitting: Gastroenterology

## 2021-01-31 DIAGNOSIS — K529 Noninfective gastroenteritis and colitis, unspecified: Secondary | ICD-10-CM | POA: Insufficient documentation

## 2021-01-31 DIAGNOSIS — R195 Other fecal abnormalities: Secondary | ICD-10-CM | POA: Insufficient documentation

## 2021-01-31 DIAGNOSIS — K7460 Unspecified cirrhosis of liver: Secondary | ICD-10-CM | POA: Diagnosis present

## 2021-01-31 DIAGNOSIS — K56699 Other intestinal obstruction unspecified as to partial versus complete obstruction: Secondary | ICD-10-CM | POA: Diagnosis present

## 2021-01-31 DIAGNOSIS — D509 Iron deficiency anemia, unspecified: Secondary | ICD-10-CM | POA: Insufficient documentation

## 2021-01-31 MED ORDER — IOHEXOL 350 MG/ML SOLN
75.0000 mL | Freq: Once | INTRAVENOUS | Status: AC | PRN
  Administered 2021-01-31: 75 mL via INTRAVENOUS

## 2021-02-19 ENCOUNTER — Other Ambulatory Visit: Payer: Self-pay

## 2021-02-19 ENCOUNTER — Ambulatory Visit
Admission: RE | Admit: 2021-02-19 | Discharge: 2021-02-19 | Disposition: A | Discharge status: Home / Self Care | Payer: Medicare Other | Source: Ambulatory Visit | Attending: Acute Care | Admitting: Acute Care

## 2021-02-19 DIAGNOSIS — R918 Other nonspecific abnormal finding of lung field: Secondary | ICD-10-CM | POA: Insufficient documentation

## 2021-02-19 DIAGNOSIS — Z87891 Personal history of nicotine dependence: Secondary | ICD-10-CM | POA: Insufficient documentation

## 2021-03-11 NOTE — Progress Notes (Signed)
I have attempted to call the patient with the results of her low dose CT Chest. There was no answer. I have left a HIPPA compliant message on the patient's VM requesting that she call the office for the results. Denise, Lung RADS 2: nodules that are benign in appearance and behavior with a very low likelihood of becoming a clinically active cancer due to size or lack of growth. Recommendation per radiology is for a repeat LDCT in 12 months.  She has cirrhosis >> She is followed by GI for this.  Please let her know there was notation that her spleen was enlarged, but was incompletely imaged. Have her follow up with her PCP  and GI physician for further evaluation. Please place order for annual screening scan and fax results to PCP. Thanks so much

## 2021-03-12 ENCOUNTER — Telehealth: Payer: Self-pay | Admitting: Acute Care

## 2021-03-12 DIAGNOSIS — Z87891 Personal history of nicotine dependence: Secondary | ICD-10-CM

## 2021-03-13 NOTE — Telephone Encounter (Signed)
Noted.  Will close encounter.  

## 2021-03-13 NOTE — Telephone Encounter (Signed)
Patient is returning a call from Judson Roch.  Patient is aware that Judson Roch or Langley Gauss will return her call.  Please advise. thanks

## 2021-03-13 NOTE — Telephone Encounter (Signed)
Results faxed to PCP.  Order placed for 1 yr f/u low dose ct.  

## 2021-03-13 NOTE — Addendum Note (Signed)
Addended by: Doroteo Glassman D on: 03/13/2021 04:10 PM   Modules accepted: Orders

## 2021-03-13 NOTE — Telephone Encounter (Signed)
I have called the patient with the results of her low dose CT. I explained it was read as a Lung RADS 2: nodules that are benign in appearance and behavior with a very low likelihood of becoming a clinically active cancer due to size or lack of growth. Recommendation per radiology is for a repeat LDCT in 12 months.  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 There was also notation of a slightly enlarged spleen. This was incompletely imaged.  I have asked the patient to follow up with her PCP to determine if this needs further evaluation. She has agreed to do this. Annual follow up low dose CT Chest Fax results to PCP. Thanks so much

## 2021-04-02 ENCOUNTER — Other Ambulatory Visit: Payer: Self-pay

## 2021-04-02 ENCOUNTER — Inpatient Hospital Stay: Payer: Medicare Other | Attending: Oncology

## 2021-04-02 DIAGNOSIS — D500 Iron deficiency anemia secondary to blood loss (chronic): Secondary | ICD-10-CM | POA: Diagnosis present

## 2021-04-02 DIAGNOSIS — D509 Iron deficiency anemia, unspecified: Secondary | ICD-10-CM

## 2021-04-02 LAB — CBC WITH DIFFERENTIAL/PLATELET
Abs Immature Granulocytes: 0.03 10*3/uL (ref 0.00–0.07)
Basophils Absolute: 0 10*3/uL (ref 0.0–0.1)
Basophils Relative: 1 %
Eosinophils Absolute: 0.1 10*3/uL (ref 0.0–0.5)
Eosinophils Relative: 2 %
HCT: 33.6 % — ABNORMAL LOW (ref 36.0–46.0)
Hemoglobin: 10.6 g/dL — ABNORMAL LOW (ref 12.0–15.0)
Immature Granulocytes: 1 %
Lymphocytes Relative: 20 %
Lymphs Abs: 1.1 10*3/uL (ref 0.7–4.0)
MCH: 25.4 pg — ABNORMAL LOW (ref 26.0–34.0)
MCHC: 31.5 g/dL (ref 30.0–36.0)
MCV: 80.4 fL (ref 80.0–100.0)
Monocytes Absolute: 0.5 10*3/uL (ref 0.1–1.0)
Monocytes Relative: 9 %
Neutro Abs: 3.9 10*3/uL (ref 1.7–7.7)
Neutrophils Relative %: 67 %
Platelets: 138 10*3/uL — ABNORMAL LOW (ref 150–400)
RBC: 4.18 MIL/uL (ref 3.87–5.11)
RDW: 16 % — ABNORMAL HIGH (ref 11.5–15.5)
WBC: 5.7 10*3/uL (ref 4.0–10.5)
nRBC: 0 % (ref 0.0–0.2)

## 2021-04-02 LAB — IRON AND TIBC
Iron: 40 ug/dL (ref 28–170)
Saturation Ratios: 8 % — ABNORMAL LOW (ref 10.4–31.8)
TIBC: 480 ug/dL — ABNORMAL HIGH (ref 250–450)
UIBC: 440 ug/dL

## 2021-04-02 LAB — FERRITIN: Ferritin: 25 ng/mL (ref 11–307)

## 2021-04-04 ENCOUNTER — Other Ambulatory Visit: Payer: Self-pay

## 2021-04-04 ENCOUNTER — Encounter: Payer: Self-pay | Admitting: Oncology

## 2021-04-04 ENCOUNTER — Inpatient Hospital Stay (HOSPITAL_BASED_OUTPATIENT_CLINIC_OR_DEPARTMENT_OTHER): Payer: Medicare Other | Admitting: Oncology

## 2021-04-04 ENCOUNTER — Inpatient Hospital Stay: Payer: Medicare Other

## 2021-04-04 VITALS — BP 171/70

## 2021-04-04 VITALS — BP 181/91 | HR 69 | Temp 97.9°F | Wt 169.0 lb

## 2021-04-04 DIAGNOSIS — K7460 Unspecified cirrhosis of liver: Secondary | ICD-10-CM | POA: Diagnosis not present

## 2021-04-04 DIAGNOSIS — K3189 Other diseases of stomach and duodenum: Secondary | ICD-10-CM

## 2021-04-04 DIAGNOSIS — K766 Portal hypertension: Secondary | ICD-10-CM

## 2021-04-04 DIAGNOSIS — D500 Iron deficiency anemia secondary to blood loss (chronic): Secondary | ICD-10-CM

## 2021-04-04 DIAGNOSIS — D696 Thrombocytopenia, unspecified: Secondary | ICD-10-CM

## 2021-04-04 DIAGNOSIS — D508 Other iron deficiency anemias: Secondary | ICD-10-CM

## 2021-04-04 MED ORDER — SODIUM CHLORIDE 0.9 % IV SOLN
INTRAVENOUS | Status: DC
  Filled 2021-04-04: qty 250

## 2021-04-04 MED ORDER — SODIUM CHLORIDE 0.9 % IV SOLN: 200.0000 mg | Freq: Once | INTRAVENOUS | Status: DC

## 2021-04-04 MED ORDER — IRON SUCROSE 20 MG/ML IV SOLN
200.0000 mg | Freq: Once | INTRAVENOUS | Status: AC
  Administered 2021-04-04: 200 mg via INTRAVENOUS
  Filled 2021-04-04: qty 10

## 2021-04-04 NOTE — Progress Notes (Signed)
Hematology/Oncology progress note [phone] [phone]   Patient Care Team: Adin Hector, MD as PCP - General (Internal Medicine)  REFERRING PROVIDER: Adin Hector, MD CHIEF COMPLAINTS/REASON FOR VISIT:  iron deficiency anemia  HISTORY OF PRESENTING ILLNESS:  Brenda Beasley is a  70 y.o.  female with PMH listed below was seen in consultation at the request of Adin Hector, MD   for evaluation of iron deficiency anemia.   Reviewed patient's recent labs  6/21/202 Labs revealed anemia with hemoglobin of 9.1, MCV 76, normal total wbc and platlet Reviewed patient's previous labs ordered by primary care physician's office, anemia is chronic onset , duration is since 2019. Basline above 10 No aggravating or improving factors.  Associated signs and symptoms: Patient reports fatigue.  Mild SOB with exertion.  Denies weight loss, easy bruising, hematochezia, hemoptysis, hematuria. Context:  History of iron deficiency: long standing history of IDA. Takes oral iron supplementation Rectal bleeding: deneis.  Menstrual bleeding/ Vaginal bleeding : denies Hematemesis or hemoptysis : denies Blood in urine : denies   History partial colectomy about 15 years due to large polyp. Per patient, pathology showed pre-cancer polyp.  Family history is positive for colon cancer in mother and breast cancer in sister.  Accompanied by her son.  She craves for ice chips.    INTERVAL HISTORY Brenda Beasley is a 70 y.o. female who has above history reviewed by me today presents for follow up visit for  iron deficiency anemia.  Recent admission from 01/16/2021 - 01/17/2021 due to reported hematemesis which was later felt to be episode of epistaxis. Patient has heme positive stool 01/09/2021 her recent upper EGD and colonoscopy.  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  Small hiatal  hernia and grade 1 esophageal varices.  Review of Systems  Constitutional:  Positive for fatigue. Negative for appetite change, chills, fever and unexpected weight change.  HENT:   Negative for hearing loss and voice change.   Eyes:  Negative for eye problems.  Respiratory:  Negative for chest tightness, cough and shortness of breath.   Cardiovascular:  Negative for chest pain.  Gastrointestinal:  Negative for abdominal distention, abdominal pain and blood in stool.  Endocrine: Negative for hot flashes.  Genitourinary:  Negative for difficulty urinating and frequency.   Musculoskeletal:  Positive for back pain. Negative for arthralgias.  Skin:  Negative for itching and rash.  Neurological:  Negative for extremity weakness.  Hematological:  Negative for adenopathy.  Psychiatric/Behavioral:  Negative for confusion.    MEDICAL HISTORY:  Past Medical History:  Diagnosis Date   Anxiety    Asthma    Chronic pruritus    Cirrhosis of liver (HCC)    COPD (chronic obstructive pulmonary disease) ([place])    Diabetes mellitus without complication (HCC)    GERD (gastroesophageal reflux disease)    Hematuria, microscopic    History of uterine fibroid    History of ventral hernia    Hyperlipidemia    Hypertension    IBS (irritable bowel syndrome)    IDA (iron deficiency anemia) 11/13/2020   Osteoporosis with current pathological fracture 11/03/2018   Personal history of tobacco use, presenting hazards to health 09/20/2015   Proteinuria    Stroke ([place])    Tobacco abuse 10/04/2014   Vitamin B 12 deficiency    Vitamin D deficiency     SURGICAL HISTORY: Past Surgical History:  Procedure Laterality Date   COLON SURGERY     COLONOSCOPY N/A 01/09/2021   Procedure: COLONOSCOPY;  Surgeon: Annamaria Helling, DO;  Location: [facility] END[facility]COPY;  Service: Gastroenterology;  Laterality: N/A;   COLONOSCOPY WITH PROPOFOL N/A 11/23/2018   Procedure: COLONOSCOPY WITH PROPOFOL;  Surgeon: Toledo, Benay Pike, MD;   Location: [facility] ENDOSCOPY;  Service: Gastroenterology;  Laterality: N/A;   ESOPHAGOGASTRODUODENOSCOPY N/A 01/09/2021   Procedure: ESOPHAGOGASTRODUODENOSCOPY (EGD);  Surgeon: Annamaria Helling, DO;  Location: [facility] ENDOSCOPY;  Service: Gastroenterology;  Laterality: N/A;   ESOPHAGOGASTRODUODENOSCOPY (EGD) WITH PROPOFOL N/A 11/23/2018   Procedure: ESOPHAGOGASTRODUODENOSCOPY (EGD) WITH PROPOFOL;  Surgeon: Toledo, Benay Pike, MD;  Location: [facility] ENDOSCOPY;  Service: Gastroenterology;  Laterality: N/A;   ESOPHAGOGASTRODUODENOSCOPY (EGD) WITH PROPOFOL N/A 01/17/2021   Procedure: ESOPHAGOGASTRODUODENOSCOPY (EGD) WITH PROPOFOL;  Surgeon: Lin Landsman, MD;  Location: [facility] ENDOSCOPY;  Service: Gastroenterology;  Laterality: N/A;   HERNIA REPAIR     PARTIAL COLECTOMY     TONSILLECTOMY     VENTRAL HERNIA REPAIR      SOCIAL HISTORY: Social History   Socioeconomic History   Marital status: Widowed    Spouse name: Not on file   Number of children: Not on file   Years of education: Not on file   Highest education level: Not on file  Occupational History   Not on file  Tobacco Use   Smoking status: Former    Packs/day: 1.00    Years: 52.00    Pack years: 52.00    Types: Cigarettes    Quit date: 06/12/2018    Years since quitting: 2.8   Smokeless tobacco: Never  Vaping Use   Vaping Use: Never used  Substance and Sexual Activity   Alcohol use: No    Alcohol/week: 0.0 standard drinks   Drug use: Not Currently   Sexual activity: Not on file  Other Topics Concern   Not on file  Social History Narrative   Not on file   Social Determinants of Health   Financial Resource Strain: Not on file  Food Insecurity: Not on file  Transportation Needs: Not on file  Physical Activity: Not on file  Stress: Not on file  Social Connections: Not on file  Intimate Partner Violence: Not on file    FAMILY HISTORY: Family History  Problem Relation Age of Onset   Diabetes Mother    Diabetes Father     Diabetes Sister    Diabetes Maternal Grandmother    Diabetes Paternal Grandmother     ALLERGIES:  is allergic to molds & smuts, other, penicillins, pioglitazone, byetta 10 mcg pen [exenatide], dapagliflozin, jardiance [empagliflozin], victoza [liraglutide], sulfa antibiotics, and sulfasalazine.  MEDICATIONS:  Current Outpatient Medications  Medication Sig Dispense Refill   albuterol (VENTOLIN HFA) 108 (90 Base) MCG/ACT inhaler Inhale 2 puffs into the lungs every 6 (six) hours as needed.     Cholecalciferol (VITAMIN D3) 5000 units TABS Take 5,000 Units by mouth daily.      cyanocobalamin (,VITAMIN B-12,) 1000 MCG/ML injection Inject 1,000 mcg into the muscle every 30 (thirty) days.     fluticasone (FLONASE) 50 MCG/ACT nasal spray Place 2 sprays into the nose daily.     gabapentin (NEURONTIN) 100 MG capsule Take 100 mg by mouth at bedtime as needed.     hydrochlorothiazide (HYDRODIURIL) 25 MG tablet Hold until followup with outpatient doctor due to intermittent low blood pressure.     ipratropium-albuterol (DUONEB) 0.5-2.5 (3) MG/3ML SOLN Take 3 mLs 4 (four) times daily as needed  by nebulization.     lisinopril (ZESTRIL) 40 MG tablet Hold until followup with outpatient doctor due to intermittent low blood pressure.     lovastatin (MEVACOR) 40 MG tablet Take 2 tablets by mouth daily.     metFORMIN (GLUCOPHAGE) 1000 MG tablet Take 1,000 mg by mouth 2 (two) times daily.     omeprazole (PRILOSEC) 20 MG capsule Take 20 mg by mouth 2 (two) times daily.     aspirin EC 81 MG tablet Take 81 mg by mouth daily.     CALCIUM PO Take 1 tablet by mouth every evening.     ferrous sulfate 325 (65 FE) MG tablet Take 325 mg by mouth daily.     Insulin Glargine, 2 Unit Dial, (TOUJEO MAX SOLOSTAR) 300 UNIT/ML SOPN Inject 300 Units/mL into the skin at bedtime.     MAGNESIUM PO Take 1 tablet by mouth at bedtime.      vitamin C (ASCORBIC ACID) 250 MG tablet Take 250 mg by mouth daily.     No current  facility-administered medications for this visit.     PHYSICAL EXAMINATION: ECOG PERFORMANCE STATUS: 1 - Symptomatic but completely ambulatory Vitals:   04/04/21 1124  BP: (!) 181/91  Pulse: 69  Temp: 97.9 F (36.6 C)   Filed Weights   04/04/21 1124  Weight: 169 lb (76.7 kg)    Physical Exam Constitutional:      General: She is not in acute distress.    Appearance: She is obese.     Comments: She walks independently.   HENT:     Head: Normocephalic and atraumatic.  Eyes:     General: No scleral icterus. Cardiovascular:     Rate and Rhythm: Normal rate and regular rhythm.     Heart sounds: Normal heart sounds.  Pulmonary:     Effort: Pulmonary effort is normal. No respiratory distress.     Breath sounds: No wheezing.  Abdominal:     General: Bowel sounds are normal. There is no distension.     Palpations: Abdomen is soft.  Musculoskeletal:        General: No deformity. Normal range of motion.     Cervical back: Normal range of motion and neck supple.  Skin:    General: Skin is warm and dry.     Findings: No erythema or rash.  Neurological:     Mental Status: She is alert and oriented to person, place, and time. Mental status is at baseline.     Cranial Nerves: No cranial nerve deficit.     Coordination: Coordination normal.  Psychiatric:        Mood and Affect: Mood normal.       CBC Latest Ref Rng & Units 04/02/2021  WBC 4.0 - 10.5 K/uL 5.7  Hemoglobin 12.0 - 15.0 g/dL 10.6(L)  Hematocrit 36.0 - 46.0 % 33.6(L)  Platelets 150 - 400 K/uL 138(L)     LABORATORY DATA:  I have reviewed the data as listed Lab Results  Component Value Date   WBC 5.7 04/02/2021   HGB 10.6 (L) 04/02/2021   HCT 33.6 (L) 04/02/2021   MCV 80.4 04/02/2021   PLT 138 (L) 04/02/2021   Recent Labs    01/16/21 1323  NA 139  K 3.6  CL 102  CO2 26  GLUCOSE 90  BUN 15  CREATININE 0.95  CALCIUM 9.4  GFRNONAA >60  PROT 7.7  ALBUMIN 4.0  AST 26  ALT 16  ALKPHOS 66   BILITOT 0.6  Iron/TIBC/Ferritin/ %Sat    Component Value Date/Time   IRON 40 04/02/2021 1125   TIBC 480 (H) 04/02/2021 1125   FERRITIN 25 04/02/2021 1125   IRONPCTSAT 8 (L) 04/02/2021 1125     RADIOGRAPHIC STUDIES: I have personally reviewed the radiological images as listed and agreed with the findings in the report. CT ENTERO ABD/PELVIS W CONTAST  Result Date: 02/01/2021 CLINICAL DATA:  Question inflammatory bowel disease EXAM: CT ABDOMEN AND PELVIS WITH CONTRAST (ENTEROGRAPHY) TECHNIQUE: Multidetector CT of the abdomen and pelvis during bolus administration of intravenous contrast. Negative oral contrast was given. CONTRAST:  72mL OMNIPAQUE IOHEXOL 350 MG/ML SOLN COMPARISON:  None. FINDINGS: Lower chest: Lung bases are clear. Hepatobiliary: Fine nodular contour of the liver. Portal veins are patent. Gallbladder normal. Pancreas: Pancreas is normal. No ductal dilatation. No pancreatic inflammation. Spleen: Spleen is enlarged measuring 15 cm in craniocaudad dimension. Adrenals/urinary tract: Benign cysts in the upper pole of the RIGHT kidney. Ureters and bladder normal. Stomach/Bowel: GI tract is distended by negative oral contrast. Normal mucosal pattern in the stomach. Duodenum is normal. Normal mucosal pattern jejunum and ileum. No stricturing of the ileum. No dilatation. Post partial RIGHT hemicolectomy. No inflammation at the enteric colonic anastomosis in the RIGHT upper quadrant. Transverse and descending colon normal. Mild mucosal enhancement of the sigmoid colon. Sigmoid mucosal enhancement seen on coronal image 56/series 6. Rectum normal. No fistula or abscess. Vascular/Lymphatic: Abdominal aorta is normal caliber with atherosclerotic calcification. There is no retroperitoneal or periportal lymphadenopathy. No pelvic lymphadenopathy. Reproductive: Uterus and adnexa unremarkable. Other: No free fluid. Musculoskeletal: No aggressive osseous lesion. IMPRESSION: 1. Mild mucosal enhancement  of sigmoid colon could represent mild inflammatory bowel disease. 2. No stricturing or dilatation of the small bowel. No active inflammation of the small bowel. 3. Post partial RIGHT hemicolectomy. No inflammation at the enteric colonic anastomosis. 4. No fistula or abscess. 5. Nodular liver and splenomegaly suggest early cirrhosis. Electronically Signed   By: Suzy Bouchard M.D.   On: 02/01/2021 19:43   CT CHEST LCS NODULE F/U W/O CONTRAST  Result Date: 02/21/2021 CLINICAL DATA:  Former smoker, quit in January 2020, 52 pack-year history. EXAM: CT CHEST WITHOUT CONTRAST FOR LUNG CANCER SCREENING NODULE FOLLOW-UP TECHNIQUE: Multidetector CT imaging of the chest was performed following the standard protocol without IV contrast. COMPARISON:  11/15/2020 and 11/01/2019. FINDINGS: Cardiovascular: Atherosclerotic calcification of the aorta, aortic valve and coronary arteries. Heart is at the upper limits of normal in size to mildly enlarged. No pericardial effusion. Mediastinum/Nodes: No pathologically enlarged mediastinal or axillary lymph nodes. Hilar regions are difficult to evaluate without IV contrast but appear grossly unremarkable. Esophagus is grossly unremarkable. Lungs/Pleura: Centrilobular emphysema. Residual smoking related respiratory bronchiolitis. Pulmonary nodules measure 7.0 mm or less in size, as before. No suspicious pulmonary nodules. No pleural fluid. Debris in the airway. Upper Abdomen: Liver margin is irregular. Liver appears enlarged but is incompletely imaged. Adrenal glands are unremarkable. Low-attenuation lesions in the upper pole right kidney measure up to 2.1 cm and are likely cysts. Visualized portions of the kidneys are otherwise unremarkable. Spleen appears enlarged but is incompletely imaged. Visualized portion of the pancreas is unremarkable. Thickening of the greater curvature of the stomach. No upper abdominal adenopathy. Musculoskeletal: Degenerative changes in the spine. No  worrisome lytic or sclerotic lesions. IMPRESSION: 1. Lung-RADS 2, benign appearance or behavior. Continue annual screening with low-dose chest CT without contrast in 12 months. 2. Cirrhosis.  Spleen appears enlarged but is incompletely imaged. 3. Thickening of the  greater curvature of the stomach can be seen with gastritis. 4. Aortic atherosclerosis (ICD10-I70.0). Coronary artery calcification. 5.  Emphysema (ICD10-J43.9). Electronically Signed   By: Lorin Picket M.D.   On: 02/21/2021 12:35        ASSESSMENT & PLAN:  1. Iron deficiency anemia due to chronic blood loss   2. Thrombocytopenia ([place])   3. Cirrhosis of liver without ascites, unspecified hepatic cirrhosis type ([place])   4. Portal hypertensive gastropathy (HCC)    # Iron deficiency anemia.  Labs are reviewed and discussed with patient Hemoglobin is stable at 10.6, Iron panel is consistent with iron deficiency  Recommend IV Venofer every 2 weeks x 3  # Cirrhosis/ portal hypertension gastropathy.  Continue follow-up with gastroenterology..   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   # Former smoker, Lung cancer screening CT 11/25/20 reviewed. Continue annual CT screening   Orders Placed This Encounter  Procedures   CBC with Differential/Platelet    Standing Status:   Future    Standing Expiration Date:   04/04/2022   Ferritin    Standing Status:   Future    Standing Expiration Date:   04/04/2022   Iron and TIBC    Standing Status:   Future    Standing Expiration Date:   04/04/2022    All questions were answered. The patient knows to call the clinic with any problems questions or concerns.  Cc Adin Hector, MD  Return of visit:  3 months.   Earlie Server, MD, PhD  04/04/2021

## 2021-04-04 NOTE — Patient Instructions (Signed)

## 2021-04-17 ENCOUNTER — Encounter: Payer: Self-pay | Admitting: Licensed Clinical Social Worker

## 2021-04-17 ENCOUNTER — Inpatient Hospital Stay: Payer: Medicare Other

## 2021-04-17 ENCOUNTER — Inpatient Hospital Stay: Payer: Medicare Other | Attending: Oncology | Admitting: Licensed Clinical Social Worker

## 2021-04-17 ENCOUNTER — Other Ambulatory Visit: Payer: Self-pay

## 2021-04-17 DIAGNOSIS — Q859 Phakomatosis, unspecified: Secondary | ICD-10-CM | POA: Insufficient documentation

## 2021-04-17 DIAGNOSIS — Z803 Family history of malignant neoplasm of breast: Secondary | ICD-10-CM

## 2021-04-17 DIAGNOSIS — Z8051 Family history of malignant neoplasm of kidney: Secondary | ICD-10-CM

## 2021-04-17 DIAGNOSIS — D500 Iron deficiency anemia secondary to blood loss (chronic): Secondary | ICD-10-CM | POA: Insufficient documentation

## 2021-04-17 DIAGNOSIS — Z800 Family history of malignant neoplasm of digestive organs: Secondary | ICD-10-CM

## 2021-04-17 DIAGNOSIS — Z801 Family history of malignant neoplasm of trachea, bronchus and lung: Secondary | ICD-10-CM | POA: Insufficient documentation

## 2021-04-17 NOTE — Progress Notes (Signed)
REFERRING PROVIDER: Geanie Kenning, PA-C [place],  [place] [address]  PRIMARY PROVIDER:  Adin Hector, MD  PRIMARY REASON FOR VISIT:  1. Family history of breast cancer   2. Family history of colon cancer   3. Family history of lung cancer   4. Family history of kidney cancer   5. Hamartomatous polyp of large intestine (HCC)      HISTORY OF PRESENT ILLNESS:   Brenda Beasley, a 70 y.o. female, was seen for a [place] cancer genetics consultation at the request of Dr. Chauncey Mann due to a personal history of colon polyps and family history of cancer.  Brenda Beasley presents to clinic today to discuss the possibility of a hereditary predisposition to cancer, genetic testing, and to further clarify her future cancer risks, as well as potential cancer risks for family members.   Brenda Beasley is a 70 y.o. female with no personal history of cancer.    CANCER HISTORY:  Oncology History   No history exists.     RISK FACTORS:  Menarche was at age 68.  First live birth at age 69.  Ovaries intact: yes.  Hysterectomy: no.  Menopausal status: postmenopausal.  Colonoscopy: yes;  2022- 1 stomach polyp, suggestive of hamartomatous,  2 hyperplastic colon polyps, 1 rectal polyp suggestive of hamartomatous polyp. Colon polyps in 1994 and 2004, partial colectomy 2008 due to large polyp. Mammogram within the last year: no. Number of breast biopsies: 0.  Past Medical History:  Diagnosis Date   Anxiety    Asthma    Chronic pruritus    Cirrhosis of liver (HCC)    COPD (chronic obstructive pulmonary disease) ([place])    Diabetes mellitus without complication ([place])    Family history of breast cancer    Family history of colon cancer    Family history of kidney cancer    Family history of lung cancer    GERD (gastroesophageal reflux disease)    Hamartomatous polyp of large intestine (HCC)    Hematuria, microscopic    History of uterine fibroid    History of ventral hernia     Hyperlipidemia    Hypertension    IBS (irritable bowel syndrome)    IDA (iron deficiency anemia) 11/13/2020   Osteoporosis with current pathological fracture 11/03/2018   Personal history of tobacco use, presenting hazards to health 09/20/2015   Proteinuria    Stroke ([place])    Tobacco abuse 10/04/2014   Vitamin B 12 deficiency    Vitamin D deficiency     Past Surgical History:  Procedure Laterality Date   COLON SURGERY     COLONOSCOPY N/A 01/09/2021   Procedure: COLONOSCOPY;  Surgeon: Annamaria Helling, DO;  Location: [facility] ENDOSCOPY;  Service: Gastroenterology;  Laterality: N/A;   COLONOSCOPY WITH PROPOFOL N/A 11/23/2018   Procedure: COLONOSCOPY WITH PROPOFOL;  Surgeon: Toledo, Benay Pike, MD;  Location: [facility] ENDOSCOPY;  Service: Gastroenterology;  Laterality: N/A;   ESOPHAGOGASTRODUODENOSCOPY N/A 01/09/2021   Procedure: ESOPHAGOGASTRODUODENOSCOPY (EGD);  Surgeon: Annamaria Helling, DO;  Location: [facility] ENDOSCOPY;  Service: Gastroenterology;  Laterality: N/A;   ESOPHAGOGASTRODUODENOSCOPY (EGD) WITH PROPOFOL N/A 11/23/2018   Procedure: ESOPHAGOGASTRODUODENOSCOPY (EGD) WITH PROPOFOL;  Surgeon: Toledo, Benay Pike, MD;  Location: [facility] ENDOSCOPY;  Service: Gastroenterology;  Laterality: N/A;   ESOPHAGOGASTRODUODENOSCOPY (EGD) WITH PROPOFOL N/A 01/17/2021   Procedure: ESOPHAGOGASTRODUODENOSCOPY (EGD) WITH PROPOFOL;  Surgeon: Lin Landsman, MD;  Location: [facility] ENDOSCOPY;  Service: Gastroenterology;  Laterality: N/A;   HERNIA REPAIR  PARTIAL COLECTOMY     TONSILLECTOMY     VENTRAL HERNIA REPAIR      Social History   Socioeconomic History   Marital status: Widowed    Spouse name: Not on file   Number of children: Not on file   Years of education: Not on file   Highest education level: Not on file  Occupational History   Not on file  Tobacco Use   Smoking status: Former    Packs/day: 1.00    Years: 52.00    Pack years: 52.00    Types: Cigarettes    Quit date: 06/12/2018     Years since quitting: 2.8   Smokeless tobacco: Never  Vaping Use   Vaping Use: Never used  Substance and Sexual Activity   Alcohol use: No    Alcohol/week: 0.0 standard drinks   Drug use: Not Currently   Sexual activity: Not on file  Other Topics Concern   Not on file  Social History Narrative   Not on file   Social Determinants of Health   Financial Resource Strain: Not on file  Food Insecurity: Not on file  Transportation Needs: Not on file  Physical Activity: Not on file  Stress: Not on file  Social Connections: Not on file     FAMILY HISTORY:  We obtained a detailed, 4-generation family history.  Significant diagnoses are listed below: Family History  Problem Relation Age of Onset   Diabetes Mother    Diabetes Father    Diabetes Sister    Diabetes Maternal Grandmother    Diabetes Paternal Grandmother    Brenda Beasley has 1 daughter, 39, and 2 sons, 90 and 33. They have not had colonoscopies. She had 2 sisters as well, one sister died at 38 from metastatic kidney cancer. She had a daughter who had breast cancer. [name] other sister had breast cancer over age 29 and history of colon polyps. One of her daughters, patient's niece, had breast cancer that is metastatic that she is currently being treated for, no hereditary cancer genetic testing. Another niece possibly has pancreatic cancer.  Brenda Beasley mother had skin cancer and colon cancer in her 43s-60s. Patient had 4 maternal aunts, all died of lung cancer (history of smoking). She had 1 maternal uncle. No known cancers in maternal cousins. Maternal grandparents passed in their 24s.  Brenda Beasley father died of heart issues at 34. Patient had 15 paternal aunts/uncles, none had cancer that patient is aware of but she has limited information. One cousin did just pass of breast cancer. Paternal grandmother died of diabetes, grandfather died at 8 of heart issues.  Brenda Beasley is unaware of previous family history  of genetic testing for hereditary cancer risks. [name] maternal ancestors are of English/Scottish descent, and paternal ancestors are of Native American/European descent. There is no reported Ashkenazi Jewish ancestry. There is no known consanguinity.    GENETIC COUNSELING ASSESSMENT: Brenda Beasley is a 70 y.o. female with a personal history of possible hamartomatous polyps and family history of breast cancer which is somewhat suggestive of a hereditary cancer syndrome and predisposition to cancer. We, therefore, discussed and recommended the following at today's visit.   DISCUSSION:  Hamartomatous polyps may be sporadic or reveal a hereditary syndrome. The hamartomatous polyposis syndromes include juvenile polyposis syndrome (JPS); PTEN hamartoma tumor syndrome, and Peutz-Jeghers syndrome (PJS). JPS is a hereditary condition that is characterized by the presence of hamartomatous polyps in the digestive tract caused by BMPR1A and SMAD4 genes. PTEN  hamartoma tumor syndrome is characterized by a high risk of both benign and cancerous tumors of the breast, thyroid, endometrium (uterus), colorectal, kidney, and skin (melanoma). Other key features are skin changes, such as trichilemmomas (skin tags) and papillomatous papules, and macrocephaly, meaning larger than average head size. PJS is an inherited condition that puts people at an increased risk for developing hamartomatous polyps in the digestive tract, as well as cancers of the breast, colon and rectum, pancreas, stomach, testicles, ovaries, lung, cervix, and other types, caused by STK11 gene.  It is possible that one of these conditions could explain her polyps and/or family history, it is also possible that both polyps and cancer in the family happened by chance. We discussed that testing is beneficial for several reasons including knowing about other cancer risks, identifying potential screening and risk-reduction options that may be appropriate, and to  understand if other family members could be at risk for cancer and allow them to undergo genetic testing.   We reviewed the characteristics, features and inheritance patterns of hereditary cancer syndromes. We also discussed genetic testing, including the appropriate family members to test, the process of testing, insurance coverage and turn-around-time for results. We discussed the implications of a negative, positive and/or variant of uncertain significant result. We recommended Brenda Beasley pursue genetic testing for the [facility] Multi-Cancer+RNA gene panel.   The Multi-Cancer Panel + RNA offered by [facility] includes sequencing and/or deletion duplication testing of the following 84 genes: AIP, ALK, APC, ATM, AXIN2,BAP1,  BARD1, BLM, BMPR1A, BRCA1, BRCA2, BRIP1, CASR, CDC73, CDH1, CDK4, CDKN1B, CDKN1C, CDKN2A (p14ARF), CDKN2A (p16INK4a), CEBPA, CHEK2, CTNNA1, DICER1, DIS3L2, EGFR (c.2369C>T, p.Thr790Met variant only), EPCAM (Deletion/duplication testing only), FH, FLCN, GATA2, GPC3, GREM1 (Promoter region deletion/duplication testing only), HOXB13 (c.251G>A, p.Gly84Glu), HRAS, KIT, MAX, MEN1, MET, MITF (c.952G>A, p.Glu318Lys variant only), MLH1, MSH2, MSH3, MSH6, MUTYH, NBN, NF1, NF2, NTHL1, PALB2, PDGFRA, PHOX2B, PMS2, POLD1, POLE, POT1, PRKAR1A, PTCH1, PTEN, RAD50, RAD51C, RAD51D, RB1, RECQL4, RET, RUNX1, SDHAF2, SDHA (sequence changes only), SDHB, SDHC, SDHD, SMAD4, SMARCA4, SMARCB1, SMARCE1, STK11, SUFU, TERC, TERT, TMEM127, TP53, TSC1, TSC2, VHL, WRN and WT1.  Based on Brenda Beasley's family history of cancer, she meets medical criteria for genetic testing. Despite that she meets criteria, she may still have an out of pocket cost. We discussed that if her out of pocket cost for testing is over $100, the laboratory will call and confirm whether she wants to proceed with testing.  If the out of pocket cost of testing is less than $100 she will be billed by the genetic testing laboratory.   PLAN: After  considering the risks, benefits, and limitations, Brenda Beasley provided informed consent to pursue genetic testing and the blood sample was sent to [facility] for analysis of the Multi-Cancer+RNA. Results should be available within approximately 2-3 weeks' time, at which point they will be disclosed by telephone to Brenda Beasley, as will any additional recommendations warranted by these results. Brenda Beasley will receive a summary of her genetic counseling visit and a copy of her results once available. This information will also be available in Epic.   Brenda Beasley questions were answered to her satisfaction today. Our contact information was provided should additional questions or concerns arise. Thank you for the referral and allowing [name] to share in the care of your patient.   Faith Rogue, MS, [facility] Genetic Counselor [email or web address] Phone: [phone]  The patient was seen for a total of 40 minutes in face-to-face genetic counseling.  Patient was seen alone.  Dr. Grayland Ormond was available for discussion regarding this case.   _______________________________________________________________________ For Office Staff:  Number of people involved in session: 1 Was an Intern/ student involved with case: no

## 2021-04-18 ENCOUNTER — Inpatient Hospital Stay: Payer: Medicare Other

## 2021-04-18 VITALS — BP 152/74 | HR 72 | Temp 97.1°F | Resp 19

## 2021-04-18 DIAGNOSIS — D500 Iron deficiency anemia secondary to blood loss (chronic): Secondary | ICD-10-CM | POA: Diagnosis present

## 2021-04-18 DIAGNOSIS — D508 Other iron deficiency anemias: Secondary | ICD-10-CM

## 2021-04-18 MED ORDER — SODIUM CHLORIDE 0.9 % IV SOLN: 200.0000 mg | Freq: Once | INTRAVENOUS | Status: DC

## 2021-04-18 MED ORDER — SODIUM CHLORIDE 0.9 % IV SOLN
Freq: Once | INTRAVENOUS | Status: AC
  Filled 2021-04-18: qty 250

## 2021-04-18 MED ORDER — IRON SUCROSE 20 MG/ML IV SOLN
200.0000 mg | Freq: Once | INTRAVENOUS | Status: AC
  Administered 2021-04-18: 200 mg via INTRAVENOUS
  Filled 2021-04-18: qty 10

## 2021-04-18 NOTE — Patient Instructions (Signed)

## 2021-05-05 ENCOUNTER — Encounter: Payer: Self-pay | Admitting: Licensed Clinical Social Worker

## 2021-05-05 ENCOUNTER — Telehealth: Payer: Self-pay | Admitting: Licensed Clinical Social Worker

## 2021-05-05 DIAGNOSIS — Z1379 Encounter for other screening for genetic and chromosomal anomalies: Secondary | ICD-10-CM | POA: Insufficient documentation

## 2021-05-06 ENCOUNTER — Ambulatory Visit: Payer: Self-pay | Admitting: Licensed Clinical Social Worker

## 2021-05-06 DIAGNOSIS — Z800 Family history of malignant neoplasm of digestive organs: Secondary | ICD-10-CM

## 2021-05-06 DIAGNOSIS — Z801 Family history of malignant neoplasm of trachea, bronchus and lung: Secondary | ICD-10-CM

## 2021-05-06 DIAGNOSIS — Z8051 Family history of malignant neoplasm of kidney: Secondary | ICD-10-CM

## 2021-05-06 DIAGNOSIS — Z803 Family history of malignant neoplasm of breast: Secondary | ICD-10-CM

## 2021-05-06 DIAGNOSIS — Q859 Phakomatosis, unspecified: Secondary | ICD-10-CM

## 2021-05-06 DIAGNOSIS — Z1379 Encounter for other screening for genetic and chromosomal anomalies: Secondary | ICD-10-CM

## 2021-05-06 NOTE — Progress Notes (Signed)
HPI:  Brenda Beasley was previously seen in the [place] clinic due to a personal history of polyps and concerns regarding a hereditary predisposition to cancer. Please refer to our prior cancer genetics clinic note for more information regarding our discussion, assessment and recommendations, at the time. Brenda Beasley recent genetic test results were disclosed to her, as were recommendations warranted by these results. These results and recommendations are discussed in more detail below.  Brenda Beasley had a colonoscopy in 2022- 1 stomach polyp, suggestive of hamartomatous,  2 hyperplastic colon polyps, 1 rectal polyp suggestive of hamartomatous polyp. Colon polyps in 1994 and 2004, partial colectomy 2008 due to large polyp. She does not report any other features of Cowden, Peutz Jeghers Syndrome, or Juvenile Polyposis Syndrome (including mucocutaneous lesions/hyperpigmentation, breast/endometrial/thyroid cancer).   CANCER HISTORY:  Oncology History   No history exists.    FAMILY HISTORY:  We obtained a detailed, 4-generation family history.  Significant diagnoses are listed below: Family History  Problem Relation Age of Onset   Diabetes Mother    Diabetes Father    Diabetes Sister    Diabetes Maternal Grandmother    Diabetes Paternal Grandmother     Brenda Beasley has 1 daughter, 38, and 2 sons, 83 and 77. They have not had colonoscopies. She had 2 sisters as well, one sister died at 72 from metastatic kidney cancer. She had a daughter who had breast cancer. [name] other sister had breast cancer over age 39 and history of colon polyps. One of her daughters, patient's niece, had breast cancer that is metastatic that she is currently being treated for, no hereditary cancer genetic testing. Another niece possibly has pancreatic cancer.   Brenda Beasley mother had skin cancer and colon cancer in her 41s-60s. Patient had 4 maternal aunts, all died of lung cancer (history of  smoking). She had 1 maternal uncle. No known cancers in maternal cousins. Maternal grandparents passed in their 63s.   Brenda Beasley father died of heart issues at 35. Patient had 15 paternal aunts/uncles, none had cancer that patient is aware of but she has limited information. One cousin did just pass of breast cancer. Paternal grandmother died of diabetes, grandfather died at 8 of heart issues.   Brenda Beasley is unaware of previous family history of genetic testing for hereditary cancer risks. [name] maternal ancestors are of English/Scottish descent, and paternal ancestors are of Native American/European descent. There is no reported Ashkenazi Jewish ancestry. There is no known consanguinity.    GENETIC TEST RESULTS: Genetic testing reported out on 05/04/2021 through the [facility] Multi-Cancer+RNA cancer panel found no pathogenic mutations.   The Multi-Cancer Panel + RNA offered by [facility] includes sequencing and/or deletion duplication testing of the following 84 genes: AIP, ALK, APC, ATM, AXIN2,BAP1,  BARD1, BLM, BMPR1A, BRCA1, BRCA2, BRIP1, CASR, CDC73, CDH1, CDK4, CDKN1B, CDKN1C, CDKN2A (p14ARF), CDKN2A (p16INK4a), CEBPA, CHEK2, CTNNA1, DICER1, DIS3L2, EGFR (c.2369C>T, p.Thr790Met variant only), EPCAM (Deletion/duplication testing only), FH, FLCN, GATA2, GPC3, GREM1 (Promoter region deletion/duplication testing only), HOXB13 (c.251G>A, p.Gly84Glu), HRAS, KIT, MAX, MEN1, MET, MITF (c.952G>A, p.Glu318Lys variant only), MLH1, MSH2, MSH3, MSH6, MUTYH, NBN, NF1, NF2, NTHL1, PALB2, PDGFRA, PHOX2B, PMS2, POLD1, POLE, POT1, PRKAR1A, PTCH1, PTEN, RAD50, RAD51C, RAD51D, RB1, RECQL4, RET, RUNX1, SDHAF2, SDHA (sequence changes only), SDHB, SDHC, SDHD, SMAD4, SMARCA4, SMARCB1, SMARCE1, STK11, SUFU, TERC, TERT, TMEM127, TP53, TSC1, TSC2, VHL, WRN and WT1.   The test report has been scanned into EPIC and is located under the Molecular Pathology section of the  Results Review tab.  A portion of the result  report is included below for reference.     We discussed that because current genetic testing is not perfect, it is possible there may be a gene mutation in one of these genes that current testing cannot detect, but that chance is small.  There could be another gene that has not yet been discovered, or that we have not yet tested, that is responsible for the cancer diagnoses in the family. It is also possible there is a hereditary cause for the cancer in the family that Brenda Beasley did not inherit and therefore was not identified in her testing.  Therefore, it is important to remain in touch with cancer genetics in the future so that we can continue to offer Brenda Beasley the most up to date genetic testing.   Genetic testing did identify a variant of uncertain significance (VUS) in the RAD51D gene called c.208G>A.  At this time, it is unknown if this variant is associated with increased cancer risk or if this is a normal finding, but most variants such as this get reclassified to being inconsequential. It should not be used to make medical management decisions. With time, we suspect the lab will determine the significance of this variant, if any. If we do learn more about it we will try to contact Brenda Beasley to discuss it further. However, it is important to stay in touch with [name] periodically and keep the address and phone number up to date.  ADDITIONAL GENETIC TESTING: We discussed with Brenda Beasley that her genetic testing was fairly extensive.  If there are genes identified to increase cancer risk that can be analyzed in the future, we would be happy to discuss and coordinate this testing at that time.    CANCER SCREENING RECOMMENDATIONS: Brenda Beasley test result is considered negative (normal).  This means that we have not identified a hereditary cause for her personal history of polyps family history of cancer at this time. A clinical diagnosis of Cowden, Peutz Jeghers, or Juvenile Polyposis  could be considered if she develops any additional features of these conditions.  While reassuring, this does not definitively rule out a hereditary predisposition to cancer. It is still possible that there could be genetic mutations that are undetectable by current technology. There could be genetic mutations in genes that have not been tested or identified to increase cancer risk.  Therefore, it is recommended she continue to follow the cancer management and screening guidelines provided by her primary healthcare provider.   An individual's cancer risk and medical management are not determined by genetic test results alone. Overall cancer risk assessment incorporates additional factors, including personal medical history, family history, and any available genetic information that may result in a personalized plan for cancer prevention and surveillance.  RECOMMENDATIONS FOR FAMILY MEMBERS:  Relatives in this family might be at some increased risk of developing cancer, over the general population risk, simply due to the family history of cancer.  We recommended female relatives in this family have a yearly mammogram beginning at age 44, or 66 years younger than the earliest onset of cancer, an annual clinical breast exam, and perform monthly breast self-exams. Female relatives in this family should also have a gynecological exam as recommended by their primary provider.  All family members should be referred for colonoscopy starting at age 34.    It is also possible there is a hereditary cause for the cancer in Ms. Mariotti's family that  she did not inherit and therefore was not identified in her.  Based on Ms. Marotto's family history, we recommended her nieces who have had breast cancer have genetic counseling and testing. Ms. Harkless will let us know if we can be of any assistance in coordinating genetic counseling and/or testing for these family members.  FOLLOW-UP: Lastly, we discussed with Ms.  Barnett that cancer genetics is a rapidly advancing field and it is possible that new genetic tests will be appropriate for her and/or her family members in the future. We encouraged her to remain in contact with cancer genetics on an annual basis so we can update her personal and family histories and let her know of advances in cancer genetics that may benefit this family.   Our contact number was provided. Ms. Delawder questions were answered to her satisfaction, and she knows she is welcome to call us at anytime with additional questions or concerns.   Faith Rogue, MS, [facility] Genetic Counselor [email or web address] Phone: [phone]

## 2021-05-08 ENCOUNTER — Encounter: Payer: Self-pay | Admitting: Oncology

## 2021-05-08 NOTE — Telephone Encounter (Signed)
Revealed negative genetic testing.  We discussed that we do not know why she has had polyps or why there is cancer in the family. It could be due to a different gene that we are not testing, or something our current technology cannot pick up.  It will be important for her to keep in contact with genetics to learn if additional testing may be needed in the future.

## 2021-05-09 ENCOUNTER — Telehealth: Payer: Self-pay | Admitting: Oncology

## 2021-05-09 ENCOUNTER — Inpatient Hospital Stay: Payer: Medicare Other

## 2021-05-09 NOTE — Telephone Encounter (Signed)
Pt called to cancel her appt for today. Call back at [phone]

## 2021-05-22 ENCOUNTER — Inpatient Hospital Stay: Payer: Medicare Other | Attending: Oncology

## 2021-05-22 ENCOUNTER — Other Ambulatory Visit: Payer: Self-pay

## 2021-05-22 VITALS — BP 148/77 | HR 83 | Temp 97.1°F | Resp 16

## 2021-05-22 DIAGNOSIS — D500 Iron deficiency anemia secondary to blood loss (chronic): Secondary | ICD-10-CM | POA: Diagnosis present

## 2021-05-22 DIAGNOSIS — D508 Other iron deficiency anemias: Secondary | ICD-10-CM

## 2021-05-22 MED ORDER — SODIUM CHLORIDE 0.9 % IV SOLN
Freq: Once | INTRAVENOUS | Status: AC
  Filled 2021-05-22: qty 250

## 2021-05-22 MED ORDER — SODIUM CHLORIDE 0.9 % IV SOLN: 200.0000 mg | Freq: Once | INTRAVENOUS | Status: DC

## 2021-05-22 MED ORDER — IRON SUCROSE 20 MG/ML IV SOLN
200.0000 mg | Freq: Once | INTRAVENOUS | Status: AC
  Administered 2021-05-22: 200 mg via INTRAVENOUS
  Filled 2021-05-22: qty 10

## 2021-05-22 NOTE — Patient Instructions (Signed)

## 2021-07-01 ENCOUNTER — Other Ambulatory Visit: Payer: Self-pay | Admitting: *Deleted

## 2021-07-01 DIAGNOSIS — D508 Other iron deficiency anemias: Secondary | ICD-10-CM

## 2021-07-08 ENCOUNTER — Inpatient Hospital Stay: Payer: Medicare Other

## 2021-07-08 ENCOUNTER — Inpatient Hospital Stay (HOSPITAL_BASED_OUTPATIENT_CLINIC_OR_DEPARTMENT_OTHER): Payer: Medicare Other | Admitting: Oncology

## 2021-07-08 ENCOUNTER — Inpatient Hospital Stay: Payer: Medicare Other | Attending: Oncology

## 2021-07-08 ENCOUNTER — Other Ambulatory Visit: Payer: Self-pay

## 2021-07-08 ENCOUNTER — Encounter: Payer: Self-pay | Admitting: Oncology

## 2021-07-08 VITALS — BP 122/75 | HR 72 | Temp 96.3°F | Resp 18 | Wt 170.5 lb

## 2021-07-08 DIAGNOSIS — K766 Portal hypertension: Secondary | ICD-10-CM | POA: Insufficient documentation

## 2021-07-08 DIAGNOSIS — I10 Essential (primary) hypertension: Secondary | ICD-10-CM | POA: Insufficient documentation

## 2021-07-08 DIAGNOSIS — E119 Type 2 diabetes mellitus without complications: Secondary | ICD-10-CM | POA: Insufficient documentation

## 2021-07-08 DIAGNOSIS — D696 Thrombocytopenia, unspecified: Secondary | ICD-10-CM

## 2021-07-08 DIAGNOSIS — K3189 Other diseases of stomach and duodenum: Secondary | ICD-10-CM | POA: Diagnosis not present

## 2021-07-08 DIAGNOSIS — D500 Iron deficiency anemia secondary to blood loss (chronic): Secondary | ICD-10-CM

## 2021-07-08 DIAGNOSIS — Z87891 Personal history of nicotine dependence: Secondary | ICD-10-CM | POA: Diagnosis not present

## 2021-07-08 DIAGNOSIS — D508 Other iron deficiency anemias: Secondary | ICD-10-CM | POA: Insufficient documentation

## 2021-07-08 LAB — CBC WITH DIFFERENTIAL/PLATELET
Abs Immature Granulocytes: 0.02 10*3/uL (ref 0.00–0.07)
Basophils Absolute: 0 10*3/uL (ref 0.0–0.1)
Basophils Relative: 1 %
Eosinophils Absolute: 0.1 10*3/uL (ref 0.0–0.5)
Eosinophils Relative: 2 %
HCT: 34.1 % — ABNORMAL LOW (ref 36.0–46.0)
Hemoglobin: 11.1 g/dL — ABNORMAL LOW (ref 12.0–15.0)
Immature Granulocytes: 0 %
Lymphocytes Relative: 19 %
Lymphs Abs: 1 10*3/uL (ref 0.7–4.0)
MCH: 26.4 pg (ref 26.0–34.0)
MCHC: 32.6 g/dL (ref 30.0–36.0)
MCV: 81.2 fL (ref 80.0–100.0)
Monocytes Absolute: 0.5 10*3/uL (ref 0.1–1.0)
Monocytes Relative: 10 %
Neutro Abs: 3.5 10*3/uL (ref 1.7–7.7)
Neutrophils Relative %: 68 %
Platelets: 131 10*3/uL — ABNORMAL LOW (ref 150–400)
RBC: 4.2 MIL/uL (ref 3.87–5.11)
RDW: 15.9 % — ABNORMAL HIGH (ref 11.5–15.5)
WBC: 5.2 10*3/uL (ref 4.0–10.5)
nRBC: 0 % (ref 0.0–0.2)

## 2021-07-08 LAB — IRON AND TIBC
Iron: 45 ug/dL (ref 28–170)
Saturation Ratios: 9 % — ABNORMAL LOW (ref 10.4–31.8)
TIBC: 480 ug/dL — ABNORMAL HIGH (ref 250–450)
UIBC: 435 ug/dL

## 2021-07-08 LAB — FERRITIN: Ferritin: 36 ng/mL (ref 11–307)

## 2021-07-08 NOTE — Progress Notes (Signed)
Pt here for follow up. No new concerns voiced.   

## 2021-07-08 NOTE — Progress Notes (Signed)
Hematology/Oncology progress note [phone]    Patient Care Team: Adin Hector, MD as PCP - General (Internal Medicine)  REFERRING PROVIDER: Adin Hector, MD CHIEF COMPLAINTS/REASON FOR VISIT:  iron deficiency anemia  HISTORY OF PRESENTING ILLNESS:  Brenda Beasley is a  71 y.o.  female with PMH listed below was seen in consultation at the request of Adin Hector, MD   for evaluation of iron deficiency anemia.   Reviewed patient's recent labs  6/21/202 Labs revealed anemia with hemoglobin of 9.1, MCV 76, normal total wbc and platlet Reviewed patient's previous labs ordered by primary care physician's office, anemia is chronic onset , duration is since 2019. Basline above 10 No aggravating or improving factors.  Associated signs and symptoms: Patient reports fatigue.  Mild SOB with exertion.  Denies weight loss, easy bruising, hematochezia, hemoptysis, hematuria. Context:  History of iron deficiency: long standing history of IDA. Takes oral iron supplementation Rectal bleeding: deneis.  Menstrual bleeding/ Vaginal bleeding : denies Hematemesis or hemoptysis : denies Blood in urine : denies   History partial colectomy about 15 years due to large polyp. Per patient, pathology showed pre-cancer polyp.  Family history is positive for colon cancer in mother and breast cancer in sister.  Accompanied by her son.  She craves for ice chips.   admission from 01/16/2021 - 01/17/2021 due to reported hematemesis which was later felt to be episode of epistaxis. Patient has heme positive stool 01/09/2021 upper EGD and colonoscopy.  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  Small hiatal hernia and grade 1 esophageal varices.  INTERVAL HISTORY Brenda Beasley is a 71 y.o. female who has above history reviewed by me today presents for follow up visit for  iron deficiency anemia.   Patient has received IV Venofer treatments.   Today she reports feeling better.  No new complaints.  She denies any black tarry stool or blood in the stool.  Review of Systems  Constitutional:  Positive for fatigue. Negative for appetite change, chills, fever and unexpected weight change.  HENT:   Negative for hearing loss and voice change.   Eyes:  Negative for eye problems.  Respiratory:  Negative for chest tightness, cough and shortness of breath.   Cardiovascular:  Negative for chest pain.  Gastrointestinal:  Negative for abdominal distention, abdominal pain and blood in stool.  Endocrine: Negative for hot flashes.  Genitourinary:  Negative for difficulty urinating and frequency.   Musculoskeletal:  Positive for back pain. Negative for arthralgias.  Skin:  Negative for itching and rash.  Neurological:  Negative for extremity weakness.  Hematological:  Negative for adenopathy.  Psychiatric/Behavioral:  Negative for confusion.    MEDICAL HISTORY:  Past Medical History:  Diagnosis Date   Anxiety    Asthma    Chronic pruritus    Cirrhosis of liver (HCC)    COPD (chronic obstructive pulmonary disease) ([place])    Diabetes mellitus without complication ([place])    Family history of breast cancer    Family history of colon cancer    Family history of kidney cancer    Family history of lung cancer    GERD (gastroesophageal reflux disease)    Hamartomatous polyp of large intestine (HCC)    Hematuria, microscopic    History of uterine fibroid    History of ventral hernia    Hyperlipidemia    Hypertension    IBS (  irritable bowel syndrome)    IDA (iron deficiency anemia) 11/13/2020   Osteoporosis with current pathological fracture 11/03/2018   Personal history of tobacco use, presenting hazards to health 09/20/2015   Proteinuria    Stroke ([place])    Tobacco abuse 10/04/2014   Vitamin B 12 deficiency    Vitamin D deficiency     SURGICAL HISTORY: Past Surgical History:  Procedure  Laterality Date   COLON SURGERY     COLONOSCOPY N/A 01/09/2021   Procedure: COLONOSCOPY;  Surgeon: Annamaria Helling, DO;  Location: [facility] ENDOSCOPY;  Service: Gastroenterology;  Laterality: N/A;   COLONOSCOPY WITH PROPOFOL N/A 11/23/2018   Procedure: COLONOSCOPY WITH PROPOFOL;  Surgeon: Toledo, Benay Pike, MD;  Location: [facility] ENDOSCOPY;  Service: Gastroenterology;  Laterality: N/A;   ESOPHAGOGASTRODUODENOSCOPY N/A 01/09/2021   Procedure: ESOPHAGOGASTRODUODENOSCOPY (EGD);  Surgeon: Annamaria Helling, DO;  Location: [facility] ENDOSCOPY;  Service: Gastroenterology;  Laterality: N/A;   ESOPHAGOGASTRODUODENOSCOPY (EGD) WITH PROPOFOL N/A 11/23/2018   Procedure: ESOPHAGOGASTRODUODENOSCOPY (EGD) WITH PROPOFOL;  Surgeon: Toledo, Benay Pike, MD;  Location: [facility] ENDOSCOPY;  Service: Gastroenterology;  Laterality: N/A;   ESOPHAGOGASTRODUODENOSCOPY (EGD) WITH PROPOFOL N/A 01/17/2021   Procedure: ESOPHAGOGASTRODUODENOSCOPY (EGD) WITH PROPOFOL;  Surgeon: Lin Landsman, MD;  Location: [facility] ENDOSCOPY;  Service: Gastroenterology;  Laterality: N/A;   HERNIA REPAIR     PARTIAL COLECTOMY     TONSILLECTOMY     VENTRAL HERNIA REPAIR      SOCIAL HISTORY: Social History   Socioeconomic History   Marital status: Widowed    Spouse name: Not on file   Number of children: Not on file   Years of education: Not on file   Highest education level: Not on file  Occupational History   Not on file  Tobacco Use   Smoking status: Former    Packs/day: 1.00    Years: 52.00    Pack years: 52.00    Types: Cigarettes    Quit date: 06/12/2018    Years since quitting: 3.0   Smokeless tobacco: Never  Vaping Use   Vaping Use: Never used  Substance and Sexual Activity   Alcohol use: No    Alcohol/week: 0.0 standard drinks   Drug use: Not Currently   Sexual activity: Not on file  Other Topics Concern   Not on file  Social History Narrative   Not on file   Social Determinants of Health   Financial Resource Strain:  Not on file  Food Insecurity: Not on file  Transportation Needs: Not on file  Physical Activity: Not on file  Stress: Not on file  Social Connections: Not on file  Intimate Partner Violence: Not on file    FAMILY HISTORY: Family History  Problem Relation Age of Onset   Diabetes Mother    Diabetes Father    Diabetes Sister    Diabetes Maternal Grandmother    Diabetes Paternal Grandmother     ALLERGIES:  is allergic to molds & smuts, other, penicillins, pioglitazone, byetta 10 mcg pen [exenatide], dapagliflozin, jardiance [empagliflozin], victoza [liraglutide], sulfa antibiotics, and sulfasalazine.  MEDICATIONS:  Current Outpatient Medications  Medication Sig Dispense Refill   Cholecalciferol (VITAMIN D3) 5000 units TABS Take 5,000 Units by mouth daily.      gabapentin (NEURONTIN) 100 MG capsule Take 100 mg by mouth at bedtime as needed.     glipiZIDE (GLUCOTROL) 10 MG tablet Take 20 mg by mouth 2 (two) times daily.     hydrochlorothiazide (HYDRODIURIL) 25 MG tablet Hold until followup with outpatient doctor due  to intermittent low blood pressure.     Insulin Glargine, 2 Unit Dial, (TOUJEO MAX SOLOSTAR) 300 UNIT/ML SOPN Inject 300 Units/mL into the skin at bedtime.     lisinopril (ZESTRIL) 40 MG tablet Hold until followup with outpatient doctor due to intermittent low blood pressure.     lovastatin (MEVACOR) 40 MG tablet Take 2 tablets by mouth daily.     metFORMIN (GLUCOPHAGE) 1000 MG tablet Take 1,000 mg by mouth 2 (two) times daily.     omeprazole (PRILOSEC) 20 MG capsule Take 20 mg by mouth 2 (two) times daily.     cyanocobalamin (,VITAMIN B-12,) 1000 MCG/ML injection Inject 1,000 mcg into the muscle every 30 (thirty) days. (Patient not taking: Reported on 07/08/2021)     ferrous sulfate 325 (65 FE) MG tablet Take 325 mg by mouth daily. (Patient not taking: Reported on 07/08/2021)     fluticasone (FLONASE) 50 MCG/ACT nasal spray Place 2 sprays into the nose daily. (Patient not  taking: Reported on 07/08/2021)     ipratropium-albuterol (DUONEB) 0.5-2.5 (3) MG/3ML SOLN Take 3 mLs 4 (four) times daily as needed by nebulization. (Patient not taking: Reported on 07/08/2021)     MAGNESIUM PO Take 1 tablet by mouth at bedtime.  (Patient not taking: Reported on 07/08/2021)     vitamin C (ASCORBIC ACID) 250 MG tablet Take 250 mg by mouth daily. (Patient not taking: Reported on 07/08/2021)     No current facility-administered medications for this visit.     PHYSICAL EXAMINATION: ECOG PERFORMANCE STATUS: 1 - Symptomatic but completely ambulatory Vitals:   07/08/21 1026  BP: 122/75  Pulse: 72  Resp: 18  Temp: (!) 96.3 F (35.7 C)   Filed Weights   07/08/21 1026  Weight: 170 lb 8 oz (77.3 kg)    Physical Exam Constitutional:      General: She is not in acute distress.    Appearance: She is obese.     Comments: She walks independently.   HENT:     Head: Normocephalic and atraumatic.  Eyes:     General: No scleral icterus. Cardiovascular:     Rate and Rhythm: Normal rate and regular rhythm.     Heart sounds: Normal heart sounds.  Pulmonary:     Effort: Pulmonary effort is normal. No respiratory distress.     Breath sounds: No wheezing.  Abdominal:     General: Bowel sounds are normal. There is no distension.     Palpations: Abdomen is soft.  Musculoskeletal:        General: No deformity. Normal range of motion.     Cervical back: Normal range of motion and neck supple.  Skin:    General: Skin is warm and dry.     Findings: No erythema or rash.  Neurological:     Mental Status: She is alert and oriented to person, place, and time. Mental status is at baseline.     Cranial Nerves: No cranial nerve deficit.     Coordination: Coordination normal.  Psychiatric:        Mood and Affect: Mood normal.       CBC Latest Ref Rng & Units 07/08/2021  WBC 4.0 - 10.5 K/uL 5.2  Hemoglobin 12.0 - 15.0 g/dL 11.1(L)  Hematocrit 36.0 - 46.0 % 34.1(L)  Platelets 150 -  400 K/uL 131(L)     LABORATORY DATA:  I have reviewed the data as listed Lab Results  Component Value Date   WBC 5.2 07/08/2021   HGB  11.1 (L) 07/08/2021   HCT 34.1 (L) 07/08/2021   MCV 81.2 07/08/2021   PLT 131 (L) 07/08/2021   Recent Labs    01/16/21 1323  NA 139  K 3.6  CL 102  CO2 26  GLUCOSE 90  BUN 15  CREATININE 0.95  CALCIUM 9.4  GFRNONAA >60  PROT 7.7  ALBUMIN 4.0  AST 26  ALT 16  ALKPHOS 66  BILITOT 0.6    Iron/TIBC/Ferritin/ %Sat    Component Value Date/Time   IRON 45 07/08/2021 1006   TIBC 480 (H) 07/08/2021 1006   FERRITIN 36 07/08/2021 1006   IRONPCTSAT 9 (L) 07/08/2021 1006     RADIOGRAPHIC STUDIES: I have personally reviewed the radiological images as listed and agreed with the findings in the report. No results found.      ASSESSMENT & PLAN:  1. Other iron deficiency anemia   2. Thrombocytopenia ([place])   3. Portal hypertensive gastropathy (HCC)    # Iron deficiency anemia.  Labs are reviewed and discussed with patient Hemoglobin has improved to 11.1. Iron panel is consistent with iron deficiency Recommend IV Venofer every 2 weeks x 4.  # Cirrhosis/ portal hypertension gastropathy.  Continue follow-up with gastroenterology..   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  Counts are stable.  # Former smoker, Lung cancer screening CT 11/25/20 reviewed. Continue annual CT screening   Orders Placed This Encounter  Procedures   CBC with Differential/Platelet    Standing Status:   Future    Standing Expiration Date:   07/08/2022   Ferritin    Standing Status:   Future    Standing Expiration Date:   07/08/2022   Iron and TIBC    Standing Status:   Future    Standing Expiration Date:   07/08/2022   Retic Panel    Standing Status:   Future    Standing Expiration Date:   07/08/2022    All questions were answered. The patient knows to call the clinic with any problems questions or concerns.  Cc Adin Hector, MD  Return of visit:  6 months.   Earlie Server, MD, PhD  07/08/2021

## 2021-07-09 ENCOUNTER — Telehealth: Payer: Self-pay

## 2021-07-09 NOTE — Telephone Encounter (Signed)
Unable to reach patient. Detailed VM left informing her of MD recommendation.

## 2021-07-09 NOTE — Telephone Encounter (Signed)
Unable to reach pt. Left her VM with appt details and encouraged to call back if she has any questions.

## 2021-07-09 NOTE — Telephone Encounter (Signed)
Appts mailed.

## 2021-07-09 NOTE — Telephone Encounter (Signed)
-----   Message from Earlie Server, MD sent at 07/08/2021  6:31 PM EST ----- Please let her know that iron panel still shows iron deficiency.  Recommend IV Venofer every 2 weeks x 4.  Keep current follow-up appointment.

## 2021-07-16 ENCOUNTER — Other Ambulatory Visit: Payer: Self-pay

## 2021-07-16 ENCOUNTER — Inpatient Hospital Stay: Payer: Medicare Other | Attending: Oncology

## 2021-07-16 VITALS — BP 169/87 | HR 75 | Temp 97.6°F | Resp 16

## 2021-07-16 DIAGNOSIS — D508 Other iron deficiency anemias: Secondary | ICD-10-CM

## 2021-07-16 DIAGNOSIS — D509 Iron deficiency anemia, unspecified: Secondary | ICD-10-CM | POA: Insufficient documentation

## 2021-07-16 MED ORDER — SODIUM CHLORIDE 0.9 % IV SOLN
INTRAVENOUS | Status: DC | PRN
  Filled 2021-07-16: qty 250

## 2021-07-16 MED ORDER — SODIUM CHLORIDE 0.9 % IV SOLN: 200.0000 mg | Freq: Once | INTRAVENOUS | Status: DC

## 2021-07-16 MED ORDER — IRON SUCROSE 20 MG/ML IV SOLN
200.0000 mg | Freq: Once | INTRAVENOUS | Status: AC
  Administered 2021-07-16: 200 mg via INTRAVENOUS
  Filled 2021-07-16: qty 10

## 2021-07-30 ENCOUNTER — Other Ambulatory Visit: Payer: Self-pay

## 2021-07-30 ENCOUNTER — Inpatient Hospital Stay: Payer: Medicare Other

## 2021-07-30 VITALS — BP 163/83 | HR 73 | Temp 97.3°F | Resp 16

## 2021-07-30 DIAGNOSIS — D509 Iron deficiency anemia, unspecified: Secondary | ICD-10-CM | POA: Diagnosis not present

## 2021-07-30 DIAGNOSIS — D508 Other iron deficiency anemias: Secondary | ICD-10-CM

## 2021-07-30 MED ORDER — IRON SUCROSE 20 MG/ML IV SOLN
200.0000 mg | Freq: Once | INTRAVENOUS | Status: AC
  Administered 2021-07-30: 200 mg via INTRAVENOUS
  Filled 2021-07-30: qty 10

## 2021-07-30 MED ORDER — SODIUM CHLORIDE 0.9 % IV SOLN: 200.0000 mg | Freq: Once | INTRAVENOUS | Status: DC

## 2021-07-30 MED ORDER — SODIUM CHLORIDE 0.9 % IV SOLN
Freq: Once | INTRAVENOUS | Status: AC
  Filled 2021-07-30: qty 250

## 2021-07-30 NOTE — Patient Instructions (Signed)

## 2021-08-12 ENCOUNTER — Other Ambulatory Visit: Payer: Self-pay

## 2021-08-12 ENCOUNTER — Inpatient Hospital Stay: Payer: Medicare Other

## 2021-08-12 VITALS — BP 165/90 | HR 77 | Temp 97.6°F

## 2021-08-12 DIAGNOSIS — D509 Iron deficiency anemia, unspecified: Secondary | ICD-10-CM | POA: Diagnosis not present

## 2021-08-12 DIAGNOSIS — D508 Other iron deficiency anemias: Secondary | ICD-10-CM

## 2021-08-12 MED ORDER — SODIUM CHLORIDE 0.9 % IV SOLN
INTRAVENOUS | Status: DC
  Filled 2021-08-12: qty 250

## 2021-08-12 MED ORDER — SODIUM CHLORIDE 0.9 % IV SOLN: 200.0000 mg | Freq: Once | INTRAVENOUS | Status: DC

## 2021-08-12 MED ORDER — IRON SUCROSE 20 MG/ML IV SOLN
200.0000 mg | Freq: Once | INTRAVENOUS | Status: AC
  Administered 2021-08-12: 200 mg via INTRAVENOUS
  Filled 2021-08-12: qty 10

## 2021-08-12 NOTE — Patient Instructions (Signed)

## 2021-08-13 ENCOUNTER — Inpatient Hospital Stay: Payer: Medicare Other

## 2021-08-27 ENCOUNTER — Inpatient Hospital Stay: Payer: Medicare Other | Attending: Oncology

## 2021-08-27 VITALS — BP 141/76 | HR 79 | Temp 97.8°F

## 2021-08-27 DIAGNOSIS — D696 Thrombocytopenia, unspecified: Secondary | ICD-10-CM | POA: Insufficient documentation

## 2021-08-27 DIAGNOSIS — D508 Other iron deficiency anemias: Secondary | ICD-10-CM | POA: Diagnosis present

## 2021-08-27 MED ORDER — SODIUM CHLORIDE 0.9 % IV SOLN
INTRAVENOUS | Status: DC
  Filled 2021-08-27: qty 250

## 2021-08-27 MED ORDER — SODIUM CHLORIDE 0.9 % IV SOLN: 200.0000 mg | Freq: Once | INTRAVENOUS | Status: DC

## 2021-08-27 MED ORDER — IRON SUCROSE 20 MG/ML IV SOLN
200.0000 mg | Freq: Once | INTRAVENOUS | Status: AC
  Administered 2021-08-27: 200 mg via INTRAVENOUS
  Filled 2021-08-27: qty 10

## 2021-08-27 NOTE — Patient Instructions (Signed)

## 2021-12-26 ENCOUNTER — Other Ambulatory Visit: Payer: Self-pay | Admitting: Gastroenterology

## 2021-12-26 DIAGNOSIS — K7460 Unspecified cirrhosis of liver: Secondary | ICD-10-CM

## 2022-01-05 ENCOUNTER — Other Ambulatory Visit: Payer: Self-pay | Admitting: *Deleted

## 2022-01-05 ENCOUNTER — Inpatient Hospital Stay: Payer: Medicare Other | Attending: Oncology

## 2022-01-05 DIAGNOSIS — D500 Iron deficiency anemia secondary to blood loss (chronic): Secondary | ICD-10-CM | POA: Diagnosis present

## 2022-01-05 DIAGNOSIS — D696 Thrombocytopenia, unspecified: Secondary | ICD-10-CM | POA: Diagnosis not present

## 2022-01-05 DIAGNOSIS — K766 Portal hypertension: Secondary | ICD-10-CM | POA: Insufficient documentation

## 2022-01-05 DIAGNOSIS — K3189 Other diseases of stomach and duodenum: Secondary | ICD-10-CM | POA: Insufficient documentation

## 2022-01-05 DIAGNOSIS — D508 Other iron deficiency anemias: Secondary | ICD-10-CM

## 2022-01-05 DIAGNOSIS — K7460 Unspecified cirrhosis of liver: Secondary | ICD-10-CM | POA: Diagnosis not present

## 2022-01-05 LAB — CBC WITH DIFFERENTIAL/PLATELET
Abs Immature Granulocytes: 0.03 10*3/uL (ref 0.00–0.07)
Basophils Absolute: 0 10*3/uL (ref 0.0–0.1)
Basophils Relative: 1 %
Eosinophils Absolute: 0.1 10*3/uL (ref 0.0–0.5)
Eosinophils Relative: 2 %
HCT: 34.5 % — ABNORMAL LOW (ref 36.0–46.0)
Hemoglobin: 10.8 g/dL — ABNORMAL LOW (ref 12.0–15.0)
Immature Granulocytes: 1 %
Lymphocytes Relative: 15 %
Lymphs Abs: 0.9 10*3/uL (ref 0.7–4.0)
MCH: 26 pg (ref 26.0–34.0)
MCHC: 31.3 g/dL (ref 30.0–36.0)
MCV: 82.9 fL (ref 80.0–100.0)
Monocytes Absolute: 0.5 10*3/uL (ref 0.1–1.0)
Monocytes Relative: 9 %
Neutro Abs: 4.2 10*3/uL (ref 1.7–7.7)
Neutrophils Relative %: 72 %
Platelets: 125 10*3/uL — ABNORMAL LOW (ref 150–400)
RBC: 4.16 MIL/uL (ref 3.87–5.11)
RDW: 15.6 % — ABNORMAL HIGH (ref 11.5–15.5)
WBC: 5.8 10*3/uL (ref 4.0–10.5)
nRBC: 0 % (ref 0.0–0.2)

## 2022-01-05 LAB — RETIC PANEL
Immature Retic Fract: 17.1 % — ABNORMAL HIGH (ref 2.3–15.9)
RBC.: 4.17 MIL/uL (ref 3.87–5.11)
Retic Count, Absolute: 91.3 10*3/uL (ref 19.0–186.0)
Retic Ct Pct: 2.2 % (ref 0.4–3.1)
Reticulocyte Hemoglobin: 28.3 pg (ref >27.9)

## 2022-01-05 LAB — IRON AND TIBC
Iron: 36 ug/dL (ref 28–170)
Saturation Ratios: 8 % — ABNORMAL LOW (ref 10.4–31.8)
TIBC: 476 ug/dL — ABNORMAL HIGH (ref 250–450)
UIBC: 440 ug/dL

## 2022-01-05 LAB — FERRITIN: Ferritin: 35 ng/mL (ref 11–307)

## 2022-01-06 ENCOUNTER — Inpatient Hospital Stay: Payer: Medicare Other

## 2022-01-06 ENCOUNTER — Inpatient Hospital Stay (HOSPITAL_BASED_OUTPATIENT_CLINIC_OR_DEPARTMENT_OTHER): Payer: Medicare Other | Admitting: Oncology

## 2022-01-06 ENCOUNTER — Encounter: Payer: Self-pay | Admitting: Oncology

## 2022-01-06 VITALS — BP 148/67 | HR 75

## 2022-01-06 VITALS — BP 147/73 | HR 84 | Temp 98.7°F | Resp 20 | Wt 178.1 lb

## 2022-01-06 DIAGNOSIS — D696 Thrombocytopenia, unspecified: Secondary | ICD-10-CM

## 2022-01-06 DIAGNOSIS — D500 Iron deficiency anemia secondary to blood loss (chronic): Secondary | ICD-10-CM | POA: Diagnosis not present

## 2022-01-06 DIAGNOSIS — K3189 Other diseases of stomach and duodenum: Secondary | ICD-10-CM

## 2022-01-06 DIAGNOSIS — K7469 Other cirrhosis of liver: Secondary | ICD-10-CM

## 2022-01-06 DIAGNOSIS — K766 Portal hypertension: Secondary | ICD-10-CM

## 2022-01-06 DIAGNOSIS — D508 Other iron deficiency anemias: Secondary | ICD-10-CM | POA: Diagnosis not present

## 2022-01-06 DIAGNOSIS — K7460 Unspecified cirrhosis of liver: Secondary | ICD-10-CM

## 2022-01-06 MED ORDER — SODIUM CHLORIDE 0.9 % IV SOLN
200.0000 mg | Freq: Once | INTRAVENOUS | Status: AC
  Administered 2022-01-06: 200 mg via INTRAVENOUS
  Filled 2022-01-06: qty 200

## 2022-01-06 MED ORDER — SODIUM CHLORIDE 0.9 % IV SOLN
INTRAVENOUS | Status: DC
  Filled 2022-01-06: qty 250

## 2022-01-06 NOTE — Patient Instructions (Signed)

## 2022-01-06 NOTE — Progress Notes (Signed)
Hematology/Oncology progress note [phone]    Patient Care Team: Adin Hector, MD as PCP - General (Internal Medicine)  REFERRING PROVIDER: Adin Hector, MD CHIEF COMPLAINTS/REASON FOR VISIT:  iron deficiency anemia  HISTORY OF PRESENTING ILLNESS:  Brenda Beasley is a  71 y.o.  female with PMH listed below was seen in consultation at the request of Adin Hector, MD   for evaluation of iron deficiency anemia.   Reviewed patient's recent labs  6/21/202 Labs revealed anemia with hemoglobin of 9.1, MCV 76, normal total wbc and platlet Reviewed patient's previous labs ordered by primary care physician's office, anemia is chronic onset , duration is since 2019. Basline above 10 No aggravating or improving factors.  Associated signs and symptoms: Patient reports fatigue.  Mild SOB with exertion.  Denies weight loss, easy bruising, hematochezia, hemoptysis, hematuria. Context:  History of iron deficiency: long standing history of IDA. Takes oral iron supplementation Rectal bleeding: deneis.  Menstrual bleeding/ Vaginal bleeding : denies Hematemesis or hemoptysis : denies Blood in urine : denies   History partial colectomy about 15 years due to large polyp. Per patient, pathology showed pre-cancer polyp.  Family history is positive for colon cancer in mother and breast cancer in sister.  Accompanied by her son.  She craves for ice chips.   admission from 01/16/2021 - 01/17/2021 due to reported hematemesis which was later felt to be episode of epistaxis. Patient has heme positive stool 01/09/2021 upper EGD and colonoscopy.  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  Small hiatal hernia and grade 1 esophageal varices.  INTERVAL HISTORY Brenda Beasley is a 71 y.o. female who has above history reviewed by me today presents for follow up visit for  iron deficiency anemia.   Patient previously tolerates IV Venofer treatments. Chronic fatigue, unchanged. She has noticed bright red blood in the stool recently.   Review of Systems  Constitutional:  Positive for fatigue. Negative for appetite change, chills, fever and unexpected weight change.  HENT:   Negative for hearing loss and voice change.   Eyes:  Negative for eye problems.  Respiratory:  Negative for chest tightness, cough and shortness of breath.   Cardiovascular:  Negative for chest pain.  Gastrointestinal:  Positive for blood in stool. Negative for abdominal distention and abdominal pain.  Endocrine: Negative for hot flashes.  Genitourinary:  Negative for difficulty urinating and frequency.   Musculoskeletal:  Positive for back pain. Negative for arthralgias.  Skin:  Negative for itching and rash.  Neurological:  Negative for extremity weakness.  Hematological:  Negative for adenopathy.  Psychiatric/Behavioral:  Negative for confusion.     MEDICAL HISTORY:  Past Medical History:  Diagnosis Date   Anxiety    Asthma    Chronic pruritus    Cirrhosis of liver (HCC)    COPD (chronic obstructive pulmonary disease) ([place])    Diabetes mellitus without complication ([place])    Family history of breast cancer    Family history of colon cancer    Family history of kidney cancer    Family history of lung cancer    GERD (gastroesophageal reflux disease)    Hamartomatous polyp of large intestine (HCC)    Hematuria, microscopic    History of uterine fibroid    History of ventral hernia    Hyperlipidemia    Hypertension    IBS (irritable bowel syndrome)  IDA (iron deficiency anemia) 11/13/2020   Osteoporosis with current pathological fracture 11/03/2018   Personal history of tobacco use, presenting hazards to health 09/20/2015   Proteinuria    Stroke ([place])    Tobacco abuse 10/04/2014   Vitamin B 12 deficiency    Vitamin D deficiency     SURGICAL HISTORY: Past Surgical History:  Procedure  Laterality Date   COLON SURGERY     COLONOSCOPY N/A 01/09/2021   Procedure: COLONOSCOPY;  Surgeon: Annamaria Helling, DO;  Location: [facility] ENDOSCOPY;  Service: Gastroenterology;  Laterality: N/A;   COLONOSCOPY WITH PROPOFOL N/A 11/23/2018   Procedure: COLONOSCOPY WITH PROPOFOL;  Surgeon: Toledo, Benay Pike, MD;  Location: [facility] ENDOSCOPY;  Service: Gastroenterology;  Laterality: N/A;   ESOPHAGOGASTRODUODENOSCOPY N/A 01/09/2021   Procedure: ESOPHAGOGASTRODUODENOSCOPY (EGD);  Surgeon: Annamaria Helling, DO;  Location: [facility] ENDOSCOPY;  Service: Gastroenterology;  Laterality: N/A;   ESOPHAGOGASTRODUODENOSCOPY (EGD) WITH PROPOFOL N/A 11/23/2018   Procedure: ESOPHAGOGASTRODUODENOSCOPY (EGD) WITH PROPOFOL;  Surgeon: Toledo, Benay Pike, MD;  Location: [facility] ENDOSCOPY;  Service: Gastroenterology;  Laterality: N/A;   ESOPHAGOGASTRODUODENOSCOPY (EGD) WITH PROPOFOL N/A 01/17/2021   Procedure: ESOPHAGOGASTRODUODENOSCOPY (EGD) WITH PROPOFOL;  Surgeon: Lin Landsman, MD;  Location: [facility] ENDOSCOPY;  Service: Gastroenterology;  Laterality: N/A;   HERNIA REPAIR     PARTIAL COLECTOMY     TONSILLECTOMY     VENTRAL HERNIA REPAIR      SOCIAL HISTORY: Social History   Socioeconomic History   Marital status: Widowed    Spouse name: Not on file   Number of children: Not on file   Years of education: Not on file   Highest education level: Not on file  Occupational History   Not on file  Tobacco Use   Smoking status: Former    Packs/day: 1.00    Years: 52.00    Total pack years: 52.00    Types: Cigarettes    Quit date: 06/12/2018    Years since quitting: 3.5   Smokeless tobacco: Never  Vaping Use   Vaping Use: Never used  Substance and Sexual Activity   Alcohol use: No    Alcohol/week: 0.0 standard drinks of alcohol   Drug use: Not Currently   Sexual activity: Not on file  Other Topics Concern   Not on file  Social History Narrative   Not on file   Social Determinants of Health   Financial  Resource Strain: Not on file  Food Insecurity: Not on file  Transportation Needs: Not on file  Physical Activity: Not on file  Stress: Not on file  Social Connections: Not on file  Intimate Partner Violence: Not on file    FAMILY HISTORY: Family History  Problem Relation Age of Onset   Diabetes Mother    Diabetes Father    Diabetes Sister    Diabetes Maternal Grandmother    Diabetes Paternal Grandmother     ALLERGIES:  is allergic to molds & smuts, other, penicillins, pioglitazone, byetta 10 mcg pen [exenatide], dapagliflozin, jardiance [empagliflozin], victoza [liraglutide], sulfa antibiotics, and sulfasalazine.  MEDICATIONS:  Current Outpatient Medications  Medication Sig Dispense Refill   Cholecalciferol (VITAMIN D3) 5000 units TABS Take 5,000 Units by mouth daily.      gabapentin (NEURONTIN) 100 MG capsule Take 100 mg by mouth at bedtime as needed.     glipiZIDE (GLUCOTROL) 10 MG tablet Take 20 mg by mouth 2 (two) times daily.     hydrochlorothiazide (HYDRODIURIL) 25 MG tablet Hold until followup with outpatient doctor due to intermittent low  blood pressure.     Insulin Glargine, 2 Unit Dial, (TOUJEO MAX SOLOSTAR) 300 UNIT/ML SOPN Inject 300 Units/mL into the skin at bedtime.     lisinopril (ZESTRIL) 40 MG tablet Hold until followup with outpatient doctor due to intermittent low blood pressure.     lovastatin (MEVACOR) 40 MG tablet Take 2 tablets by mouth daily.     MAGNESIUM PO Take 1 tablet by mouth at bedtime.     metFORMIN (GLUCOPHAGE) 1000 MG tablet Take 1,000 mg by mouth 2 (two) times daily.     omeprazole (PRILOSEC) 20 MG capsule Take 20 mg by mouth 2 (two) times daily.     vitamin C (ASCORBIC ACID) 250 MG tablet Take 250 mg by mouth daily.     cyanocobalamin (,VITAMIN B-12,) 1000 MCG/ML injection Inject 1,000 mcg into the muscle every 30 (thirty) days. (Patient not taking: Reported on 07/08/2021)     ferrous sulfate 325 (65 FE) MG tablet Take 325 mg by mouth daily.  (Patient not taking: Reported on 07/08/2021)     fluticasone (FLONASE) 50 MCG/ACT nasal spray Place 2 sprays into the nose daily. (Patient not taking: Reported on 07/08/2021)     ipratropium-albuterol (DUONEB) 0.5-2.5 (3) MG/3ML SOLN Take 3 mLs 4 (four) times daily as needed by nebulization. (Patient not taking: Reported on 07/08/2021)     No current facility-administered medications for this visit.     PHYSICAL EXAMINATION: ECOG PERFORMANCE STATUS: 1 - Symptomatic but completely ambulatory Vitals:   01/06/22 1033  BP: (!) 147/73  Pulse: 84  Resp: 20  Temp: 98.7 F (37.1 C)  SpO2: 100%   Filed Weights   01/06/22 1033  Weight: 178 lb 1.6 oz (80.8 kg)    Physical Exam Constitutional:      General: She is not in acute distress.    Appearance: She is obese.     Comments: She walks independently.   HENT:     Head: Normocephalic and atraumatic.  Eyes:     General: No scleral icterus. Cardiovascular:     Rate and Rhythm: Normal rate and regular rhythm.     Heart sounds: Normal heart sounds.  Pulmonary:     Effort: Pulmonary effort is normal. No respiratory distress.     Breath sounds: No wheezing.  Abdominal:     General: Bowel sounds are normal. There is no distension.     Palpations: Abdomen is soft.  Musculoskeletal:        General: No deformity. Normal range of motion.     Cervical back: Normal range of motion and neck supple.  Skin:    General: Skin is warm and dry.     Findings: No erythema or rash.  Neurological:     Mental Status: She is alert and oriented to person, place, and time. Mental status is at baseline.     Cranial Nerves: No cranial nerve deficit.     Coordination: Coordination normal.  Psychiatric:        Mood and Affect: Mood normal.      Laboratory studies I have reviewed the data as listed    Latest Ref Rng & Units 01/05/2022    9:31 AM  CBC  WBC 4.0 - 10.5 K/uL 5.8   Hemoglobin 12.0 - 15.0 g/dL 10.8   Hematocrit 36.0 - 46.0 % 34.5    Platelets 150 - 400 K/uL 125        Latest Ref Rng & Units 01/16/2021    1:23 PM 06/23/2018  12:02 AM 04/05/2017    4:38 AM  CMP  Glucose 70 - 99 mg/dL 90  160  184   BUN 8 - 23 mg/dL 15  20  12    Creatinine 0.44 - 1.00 mg/dL 0.95  0.97  0.87   Sodium 135 - 145 mmol/L 139  136  135   Potassium 3.5 - 5.1 mmol/L 3.6  4.2  3.8   Chloride 98 - 111 mmol/L 102  98  107   CO2 22 - 32 mmol/L 26  27  23    Calcium 8.9 - 10.3 mg/dL 9.4  9.3  8.5   Total Protein 6.5 - 8.1 g/dL 7.7  7.1    Total Bilirubin 0.3 - 1.2 mg/dL 0.6  0.8    Alkaline Phos 38 - 126 U/L 66  61    AST 15 - 41 U/L 26  30    ALT 0 - 44 U/L 16  23      Iron/TIBC/Ferritin/ %Sat    Component Value Date/Time   IRON 36 01/05/2022 0931   TIBC 476 (H) 01/05/2022 0931   FERRITIN 35 01/05/2022 0931   IRONPCTSAT 8 (L) 01/05/2022 0931     RADIOGRAPHIC STUDIES: I have personally reviewed the radiological images as listed and agreed with the findings in the report. No results found.      ASSESSMENT & PLAN:  1. Other iron deficiency anemia   2. Thrombocytopenia ([place])   3. Portal hypertensive gastropathy (HCC)   4. Other cirrhosis of liver ([place])   5. Cirrhosis of liver without ascites, unspecified hepatic cirrhosis type ([place])    # Iron deficiency anemia.  Labs reviewed and discussed with patient Persistent iron deficiency anemia. Recommend IV Venofer weekly x4. Need to follow-up with gastroenterology.  Etiology of iron deficiency due to GI blood loss. . # Cirrhosis/ portal hypertension gastropathy.  She is overdue for left upper quadrant ultrasound will obtain.  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  Counts are stable.  # Former smoker, Lung cancer screening CT 11/25/20 reviewed. Continue annual CT screening   Orders Placed This Encounter  Procedures   US Abdomen Limited RUQ (LIVER/GB)    Standing Status:    Future    Standing Expiration Date:   01/07/2023    Order Specific Question:   Reason for Exam (SYMPTOM  OR DIAGNOSIS REQUIRED)    Answer:   cirrhosis    Order Specific Question:   Preferred imaging location?    Answer:   [facility]   CBC with Differential/Platelet    Standing Status:   Future    Standing Expiration Date:   01/07/2023   Comprehensive metabolic panel    Standing Status:   Future    Standing Expiration Date:   01/06/2023   Iron and TIBC    Standing Status:   Future    Standing Expiration Date:   01/07/2023   Ferritin    Standing Status:   Future    Standing Expiration Date:   01/07/2023   AFP tumor marker    Standing Status:   Future    Standing Expiration Date:   01/06/2023   Protime-INR    Standing Status:   Future    Standing Expiration Date:   01/07/2023   APTT    Standing Status:   Future    Standing Expiration Date:   01/07/2023    All questions were answered. The patient  knows to call the clinic with any problems questions or concerns.  Cc Adin Hector, MD  Return of visit:  6 months.   Earlie Server, MD, PhD  01/06/2022

## 2022-01-13 ENCOUNTER — Other Ambulatory Visit: Payer: Self-pay

## 2022-01-13 ENCOUNTER — Telehealth: Payer: Self-pay | Admitting: *Deleted

## 2022-01-13 ENCOUNTER — Telehealth: Payer: Self-pay

## 2022-01-13 ENCOUNTER — Ambulatory Visit
Admission: RE | Admit: 2022-01-13 | Discharge: 2022-01-13 | Disposition: A | Discharge status: Home / Self Care | Payer: Medicare Other | Source: Ambulatory Visit | Attending: Oncology | Admitting: Oncology

## 2022-01-13 ENCOUNTER — Inpatient Hospital Stay: Payer: Medicare Other

## 2022-01-13 DIAGNOSIS — K7469 Other cirrhosis of liver: Secondary | ICD-10-CM | POA: Diagnosis present

## 2022-01-13 DIAGNOSIS — D500 Iron deficiency anemia secondary to blood loss (chronic): Secondary | ICD-10-CM | POA: Diagnosis not present

## 2022-01-13 DIAGNOSIS — K828 Other specified diseases of gallbladder: Secondary | ICD-10-CM

## 2022-01-13 DIAGNOSIS — D696 Thrombocytopenia, unspecified: Secondary | ICD-10-CM

## 2022-01-13 DIAGNOSIS — D508 Other iron deficiency anemias: Secondary | ICD-10-CM

## 2022-01-13 MED ORDER — SODIUM CHLORIDE 0.9 % IV SOLN
INTRAVENOUS | Status: DC
  Filled 2022-01-13: qty 250

## 2022-01-13 MED ORDER — SODIUM CHLORIDE 0.9 % IV SOLN
200.0000 mg | Freq: Once | INTRAVENOUS | Status: AC
  Administered 2022-01-13: 200 mg via INTRAVENOUS
  Filled 2022-01-13: qty 200

## 2022-01-13 NOTE — Telephone Encounter (Signed)
-----   Message from Earlie Server, MD sent at 01/13/2022  3:39 PM EDT ----- Please arrange her to do MRI/MRCP abdomen asap - possible gallbladder mass Thanks.

## 2022-01-13 NOTE — Telephone Encounter (Signed)
Please schedule and make patient aware. Thank you. ORDER IS IN.

## 2022-01-13 NOTE — Patient Instructions (Signed)
[facility] CANCER CTR AT [place]  Discharge Instructions: Thank you for choosing [facility] to provide your oncology and hematology care.  If you have a lab appointment with the [facility], please go directly to the [facility] and check in at the registration area.  Wear comfortable clothing and clothing appropriate for easy access to any Portacath or PICC line.   We strive to give you quality time with your provider. You may need to reschedule your appointment if you arrive late (15 or more minutes).  Arriving late affects you and other patients whose appointments are after yours.  Also, if you miss three or more appointments without notifying the office, you may be dismissed from the clinic at the provider's discretion.      For prescription refill requests, have your pharmacy contact our office and allow 72 hours for refills to be completed.    Today you received the following chemotherapy and/or immunotherapy agents VENOFER      To help prevent nausea and vomiting after your treatment, we encourage you to take your nausea medication as directed.  BELOW ARE SYMPTOMS THAT SHOULD BE REPORTED IMMEDIATELY: *FEVER GREATER THAN 100.4 F (38 C) OR HIGHER *CHILLS OR SWEATING *NAUSEA AND VOMITING THAT IS NOT CONTROLLED WITH YOUR NAUSEA MEDICATION *UNUSUAL SHORTNESS OF BREATH *UNUSUAL BRUISING OR BLEEDING *URINARY PROBLEMS (pain or burning when urinating, or frequent urination) *BOWEL PROBLEMS (unusual diarrhea, constipation, pain near the anus) TENDERNESS IN MOUTH AND THROAT WITH OR WITHOUT PRESENCE OF ULCERS (sore throat, sores in mouth, or a toothache) UNUSUAL RASH, SWELLING OR PAIN  UNUSUAL VAGINAL DISCHARGE OR ITCHING   Items with * indicate a potential emergency and should be followed up as soon as possible or go to the Emergency Department if any problems should occur.  Please show the CHEMOTHERAPY ALERT CARD or IMMUNOTHERAPY ALERT CARD at check-in to the  Emergency Department and triage nurse.  Should you have questions after your visit or need to cancel or reschedule your appointment, please contact [facility] [place] AT [place]  [phone] and follow the prompts.  Office hours are 8:00 a.m. to 4:30 p.m. Monday - Friday. Please note that voicemails left after 4:00 p.m. may not be returned until the following business day.  We are closed weekends and major holidays. You have access to a nurse at all times for urgent questions. Please call the main number to the clinic [phone] and follow the prompts.  For any non-urgent questions, you may also contact your provider using MyChart. We now offer e-Visits for anyone 42 and older to request care online for non-urgent symptoms. For details visit [email or web address].   Also download the MyChart app! Go to the app store, search "MyChart", open the app, select [facility], and log in with your MyChart username and password.  Masks are optional in the cancer centers. If you would like for your care team to wear a mask while they are taking care of you, please let them know. For doctor visits, patients may have with them one support person who is at least 72 years old. At this time, visitors are not allowed in the infusion area.  Iron Sucrose Injection What is this medication? IRON SUCROSE (EYE ern SOO krose) treats low levels of iron (iron deficiency anemia) in people with kidney disease. Iron is a mineral that plays an important role in making red blood cells, which carry oxygen from your lungs to the rest of your body. This medicine may be  used for other purposes; ask your health care provider or pharmacist if you have questions. COMMON BRAND NAME(S): Venofer What should I tell my care team before I take this medication? They need to know if you have any of these conditions: Anemia not caused by low iron levels Heart disease High levels of iron in the blood Kidney disease Liver  disease An unusual or allergic reaction to iron, other medications, foods, dyes, or preservatives Pregnant or trying to get pregnant Breast-feeding How should I use this medication? This medication is for infusion into a vein. It is given in a hospital or clinic setting. Talk to your care team about the use of this medication in children. While this medication may be prescribed for children as young as 2 years for selected conditions, precautions do apply. Overdosage: If you think you have taken too much of this medicine contact a poison control center or emergency room at once. NOTE: This medicine is only for you. Do not share this medicine with others. What if I miss a dose? It is important not to miss your dose. Call your care team if you are unable to keep an appointment. What may interact with this medication? Do not take this medication with any of the following: Deferoxamine Dimercaprol Other iron products This medication may also interact with the following: Chloramphenicol Deferasirox This list may not describe all possible interactions. Give your health care provider a list of all the medicines, herbs, non-prescription drugs, or dietary supplements you use. Also tell them if you smoke, drink alcohol, or use illegal drugs. Some items may interact with your medicine. What should I watch for while using this medication? Visit your care team regularly. Tell your care team if your symptoms do not start to get better or if they get worse. You may need blood work done while you are taking this medication. You may need to follow a special diet. Talk to your care team. Foods that contain iron include: whole grains/cereals, dried fruits, beans, or peas, leafy green vegetables, and organ meats (liver, kidney). What side effects may I notice from receiving this medication? Side effects that you should report to your care team as soon as possible: Allergic reactions--skin rash, itching, hives,  swelling of the face, lips, tongue, or throat Low blood pressure--dizziness, feeling faint or lightheaded, blurry vision Shortness of breath Side effects that usually do not require medical attention (report to your care team if they continue or are bothersome): Flushing Headache Joint pain Muscle pain Nausea Pain, redness, or irritation at injection site This list may not describe all possible side effects. Call your doctor for medical advice about side effects. You may report side effects to FDA at 1-800-FDA-1088. Where should I keep my medication? This medication is given in a hospital or clinic and will not be stored at home. NOTE: This sheet is a summary. It may not cover all possible information. If you have questions about this medicine, talk to your doctor, pharmacist, or health care provider.  2023 Elsevier/Gold Standard (2007-06-25 00:00:00)

## 2022-01-13 NOTE — Telephone Encounter (Signed)
Called report  IMPRESSION: 1. Possible mass with associated ring down artifact in the fundus of the gallbladder. The finding could represent an adherent polyp or a neoplasm. Recommend an MRCP of the abdomen for better evaluation of this region. The gallbladder is otherwise unremarkable. 2. The liver demonstrates a mildly nodular contour and heterogeneous coarsened echogenicity consistent with the reported history of cirrhosis. No liver mass identified.   These results will be called to the ordering clinician or representative by the Radiologist Assistant, and communication documented in the PACS or [employer].     Electronically Signed   By: Dorise Bullion III M.D.   On: 01/13/2022 12:37

## 2022-01-14 ENCOUNTER — Encounter: Payer: Self-pay | Admitting: Oncology

## 2022-01-14 NOTE — Telephone Encounter (Signed)
Anderson Malta confirmed that there are no restrictions for venofer infusions, just feraheme. Ok to schedule MRI.

## 2022-01-16 MED FILL — Iron Sucrose Inj 20 MG/ML (Fe Equiv): INTRAVENOUS | Qty: 10 | Status: AC

## 2022-01-17 ENCOUNTER — Other Ambulatory Visit: Payer: Self-pay | Admitting: Oncology

## 2022-01-17 ENCOUNTER — Ambulatory Visit
Admission: RE | Admit: 2022-01-17 | Discharge: 2022-01-17 | Disposition: A | Discharge status: Home / Self Care | Payer: Medicare Other | Source: Ambulatory Visit | Attending: Oncology | Admitting: Oncology

## 2022-01-17 DIAGNOSIS — K828 Other specified diseases of gallbladder: Secondary | ICD-10-CM | POA: Insufficient documentation

## 2022-01-20 ENCOUNTER — Inpatient Hospital Stay: Payer: Medicare Other | Attending: Oncology

## 2022-01-20 DIAGNOSIS — D508 Other iron deficiency anemias: Secondary | ICD-10-CM | POA: Insufficient documentation

## 2022-01-20 NOTE — Progress Notes (Signed)
Unable to obtain IV access today. Pt stuck twice, did not want to be stuck anymore today. Has appt scheduled for next week. Will reschedule this appt for the week after

## 2022-01-27 ENCOUNTER — Inpatient Hospital Stay: Payer: Medicare Other

## 2022-01-27 VITALS — BP 151/74 | HR 71 | Temp 96.6°F | Resp 19

## 2022-01-27 DIAGNOSIS — D508 Other iron deficiency anemias: Secondary | ICD-10-CM | POA: Diagnosis present

## 2022-01-27 MED ORDER — SODIUM CHLORIDE 0.9 % IV SOLN
INTRAVENOUS | Status: DC | PRN
  Filled 2022-01-27: qty 250

## 2022-01-27 MED ORDER — SODIUM CHLORIDE 0.9 % IV SOLN
200.0000 mg | Freq: Once | INTRAVENOUS | Status: AC
  Administered 2022-01-27: 200 mg via INTRAVENOUS
  Filled 2022-01-27: qty 200

## 2022-02-02 MED FILL — Iron Sucrose Inj 20 MG/ML (Fe Equiv): INTRAVENOUS | Qty: 10 | Status: AC

## 2022-02-03 ENCOUNTER — Inpatient Hospital Stay: Payer: Medicare Other

## 2022-02-03 VITALS — BP 148/68 | HR 72

## 2022-02-03 DIAGNOSIS — D508 Other iron deficiency anemias: Secondary | ICD-10-CM

## 2022-02-03 MED ORDER — SODIUM CHLORIDE 0.9 % IV SOLN
INTRAVENOUS | Status: DC | PRN
  Filled 2022-02-03: qty 250

## 2022-02-03 MED ORDER — SODIUM CHLORIDE 0.9 % IV SOLN
200.0000 mg | Freq: Once | INTRAVENOUS | Status: AC
  Administered 2022-02-03: 200 mg via INTRAVENOUS
  Filled 2022-02-03: qty 200

## 2022-02-19 ENCOUNTER — Ambulatory Visit: Admission: RE | Admit: 2022-02-19 | Payer: Medicare Other | Source: Ambulatory Visit

## 2022-02-27 ENCOUNTER — Telehealth: Payer: Self-pay | Admitting: *Deleted

## 2022-02-27 NOTE — Telephone Encounter (Signed)
Left message on voicemail to call to schedule LCS CT scan.

## 2022-03-19 ENCOUNTER — Telehealth: Payer: Self-pay | Admitting: Acute Care

## 2022-04-01 ENCOUNTER — Other Ambulatory Visit: Payer: Self-pay | Admitting: Internal Medicine

## 2022-04-01 DIAGNOSIS — F17200 Nicotine dependence, unspecified, uncomplicated: Secondary | ICD-10-CM

## 2022-04-01 DIAGNOSIS — Z87891 Personal history of nicotine dependence: Secondary | ICD-10-CM

## 2022-04-01 NOTE — Telephone Encounter (Signed)
Left detailed message for the patient to call our central scheduling dept to get resc at [phone]

## 2022-04-03 NOTE — Telephone Encounter (Signed)
Pt has resc appt will close note

## 2022-04-08 ENCOUNTER — Ambulatory Visit
Admission: RE | Admit: 2022-04-08 | Discharge: 2022-04-08 | Disposition: A | Discharge status: Home / Self Care | Payer: Medicare Other | Source: Ambulatory Visit | Attending: Acute Care | Admitting: Acute Care

## 2022-04-08 DIAGNOSIS — F17200 Nicotine dependence, unspecified, uncomplicated: Secondary | ICD-10-CM | POA: Insufficient documentation

## 2022-04-08 DIAGNOSIS — Z87891 Personal history of nicotine dependence: Secondary | ICD-10-CM | POA: Diagnosis present

## 2022-07-01 ENCOUNTER — Encounter: Payer: Self-pay | Admitting: Gastroenterology

## 2022-07-02 ENCOUNTER — Encounter: Payer: Self-pay | Admitting: Gastroenterology

## 2022-07-02 ENCOUNTER — Ambulatory Visit: Payer: Medicare Other | Admitting: Anesthesiology

## 2022-07-02 ENCOUNTER — Encounter
Admission: RE | Disposition: A | Discharge status: Home / Self Care | Payer: Self-pay | Source: Home / Self Care | Attending: Gastroenterology

## 2022-07-02 ENCOUNTER — Ambulatory Visit
Admission: RE | Admit: 2022-07-02 | Discharge: 2022-07-02 | Disposition: A | Discharge status: Home / Self Care | Payer: Medicare Other | Attending: Gastroenterology | Admitting: Gastroenterology

## 2022-07-02 DIAGNOSIS — K589 Irritable bowel syndrome without diarrhea: Secondary | ICD-10-CM | POA: Insufficient documentation

## 2022-07-02 DIAGNOSIS — Z6834 Body mass index (BMI) 34.0-34.9, adult: Secondary | ICD-10-CM | POA: Diagnosis not present

## 2022-07-02 DIAGNOSIS — K766 Portal hypertension: Secondary | ICD-10-CM | POA: Diagnosis not present

## 2022-07-02 DIAGNOSIS — Z09 Encounter for follow-up examination after completed treatment for conditions other than malignant neoplasm: Secondary | ICD-10-CM | POA: Insufficient documentation

## 2022-07-02 DIAGNOSIS — K222 Esophageal obstruction: Secondary | ICD-10-CM | POA: Insufficient documentation

## 2022-07-02 DIAGNOSIS — Z8673 Personal history of transient ischemic attack (TIA), and cerebral infarction without residual deficits: Secondary | ICD-10-CM | POA: Insufficient documentation

## 2022-07-02 DIAGNOSIS — J449 Chronic obstructive pulmonary disease, unspecified: Secondary | ICD-10-CM | POA: Diagnosis not present

## 2022-07-02 DIAGNOSIS — K317 Polyp of stomach and duodenum: Secondary | ICD-10-CM | POA: Diagnosis not present

## 2022-07-02 DIAGNOSIS — K219 Gastro-esophageal reflux disease without esophagitis: Secondary | ICD-10-CM | POA: Insufficient documentation

## 2022-07-02 DIAGNOSIS — F5089 Other specified eating disorder: Secondary | ICD-10-CM | POA: Diagnosis not present

## 2022-07-02 DIAGNOSIS — Z7984 Long term (current) use of oral hypoglycemic drugs: Secondary | ICD-10-CM | POA: Insufficient documentation

## 2022-07-02 DIAGNOSIS — Z87891 Personal history of nicotine dependence: Secondary | ICD-10-CM | POA: Diagnosis not present

## 2022-07-02 DIAGNOSIS — I8510 Secondary esophageal varices without bleeding: Secondary | ICD-10-CM | POA: Diagnosis not present

## 2022-07-02 DIAGNOSIS — I10 Essential (primary) hypertension: Secondary | ICD-10-CM | POA: Diagnosis not present

## 2022-07-02 DIAGNOSIS — D509 Iron deficiency anemia, unspecified: Secondary | ICD-10-CM | POA: Diagnosis not present

## 2022-07-02 DIAGNOSIS — E785 Hyperlipidemia, unspecified: Secondary | ICD-10-CM | POA: Insufficient documentation

## 2022-07-02 DIAGNOSIS — K449 Diaphragmatic hernia without obstruction or gangrene: Secondary | ICD-10-CM | POA: Insufficient documentation

## 2022-07-02 DIAGNOSIS — K7460 Unspecified cirrhosis of liver: Secondary | ICD-10-CM | POA: Diagnosis not present

## 2022-07-02 DIAGNOSIS — K3189 Other diseases of stomach and duodenum: Secondary | ICD-10-CM | POA: Insufficient documentation

## 2022-07-02 DIAGNOSIS — E119 Type 2 diabetes mellitus without complications: Secondary | ICD-10-CM | POA: Insufficient documentation

## 2022-07-02 DIAGNOSIS — Z794 Long term (current) use of insulin: Secondary | ICD-10-CM | POA: Diagnosis not present

## 2022-07-02 HISTORY — PX: ESOPHAGOGASTRODUODENOSCOPY: SHX5428

## 2022-07-02 LAB — GLUCOSE, CAPILLARY: Glucose-Capillary: 131 mg/dL — ABNORMAL HIGH (ref 70–99)

## 2022-07-02 SURGERY — EGD (ESOPHAGOGASTRODUODENOSCOPY)
Anesthesia: General

## 2022-07-02 MED ORDER — SODIUM CHLORIDE (PF) 0.9 % IJ SOLN
INTRAMUSCULAR | Status: DC | PRN
  Administered 2022-07-02: 3 mL

## 2022-07-02 MED ORDER — PROPOFOL 10 MG/ML IV BOLUS
INTRAVENOUS | Status: DC | PRN
  Administered 2022-07-02: 30 mg via INTRAVENOUS
  Administered 2022-07-02: 20 mg via INTRAVENOUS
  Administered 2022-07-02 (×2): 30 mg via INTRAVENOUS
  Administered 2022-07-02: 20 mg via INTRAVENOUS
  Administered 2022-07-02 (×4): 30 mg via INTRAVENOUS

## 2022-07-02 MED ORDER — ESMOLOL HCL 100 MG/10ML IV SOLN
INTRAVENOUS | Status: DC | PRN
  Administered 2022-07-02 (×3): 10 mg via INTRAVENOUS

## 2022-07-02 MED ORDER — SODIUM CHLORIDE 0.9 % IV SOLN: INTRAVENOUS | Status: DC

## 2022-07-02 NOTE — Anesthesia Preprocedure Evaluation (Signed)
Anesthesia Evaluation  Patient identified by MRN, date of birth, ID band Patient awake    Reviewed: Allergy & Precautions, H&P , NPO status , [name] Chart, lab work & pertinent test results  History of Anesthesia Complications Negative for: history of anesthetic complications  Airway Mallampati: II  TM Distance: >3 FB     Dental  (+) Upper Dentures, Lower Dentures, Dental Advidsory Given   Pulmonary shortness of breath and with exertion, asthma , neg sleep apnea, COPD,  COPD inhaler, neg recent URI, former smoker          Cardiovascular hypertension, (-) angina (-) Past MI and (-) Cardiac Stents (-) dysrhythmias (-) Valvular Problems/Murmurs     Neuro/Psych neg Seizures  Anxiety     CVA  negative psych ROS   GI/Hepatic Neg liver ROS,GERD  Controlled,,  Endo/Other  diabetes    Renal/GU negative Renal ROS  negative genitourinary   Musculoskeletal   Abdominal   Peds  Hematology negative hematology ROS (+)   Anesthesia Other Findings Past Medical History: No date: Anxiety No date: Asthma No date: Chronic pruritus No date: Cirrhosis of liver (HCC) No date: COPD (chronic obstructive pulmonary disease) (HCC) No date: Diabetes mellitus without complication (HCC) No date: GERD (gastroesophageal reflux disease) No date: Hematuria, microscopic No date: History of uterine fibroid No date: History of ventral hernia No date: Hyperlipidemia No date: Hypertension No date: IBS (irritable bowel syndrome) 11/03/2018: Osteoporosis with current pathological fracture 09/20/2015: Personal history of tobacco use, presenting hazards to  health No date: Proteinuria No date: Stroke ([place]) 10/04/2014: Tobacco abuse No date: Vitamin B 12 deficiency No date: Vitamin D deficiency  Past Surgical History: No date: COLON SURGERY No date: HERNIA REPAIR No date: PARTIAL COLECTOMY No date: TONSILLECTOMY No date: VENTRAL HERNIA  REPAIR  BMI    Body Mass Index: 33.76 kg/m      Reproductive/Obstetrics negative OB ROS                              Anesthesia Physical Anesthesia Plan  ASA: 3  Anesthesia Plan: General   Post-op Pain Management:    Induction: Intravenous  PONV Risk Score and Plan: Propofol infusion and TIVA  Airway Management Planned: Natural Airway and Nasal Cannula  Additional Equipment:   Intra-op Plan:   Post-operative Plan:   Informed Consent: I have reviewed the patients History and Physical, chart, labs and discussed the procedure including the risks, benefits and alternatives for the proposed anesthesia with the patient or authorized representative who has indicated his/her understanding and acceptance.     Dental Advisory Given  Plan Discussed with: Anesthesiologist and CRNA  Anesthesia Plan Comments:          Anesthesia Quick Evaluation

## 2022-07-02 NOTE — Op Note (Signed)
[facility] Patient Name: Brenda Beasley Procedure Date: 07/02/2022 10:08 AM MRN: [id number] Account #: [id number] Date of Birth: 1950-05-19 Admit Type: Outpatient Age: 72 Room: [facility] ENDO ROOM 1 Gender: Female Note Status: Finalized Instrument Name: Upper Endoscope [id number] Procedure:             Upper GI endoscopy Indications:           Iron deficiency anemia Providers:             Rueben Bash, DO Referring MD:          Ramonita Lab, MD (Referring MD) Medicines:             Monitored Anesthesia Care Complications:         No immediate complications. Estimated blood loss:                         Minimal. Procedure:             Pre-Anesthesia Assessment:                        - Prior to the procedure, a History and Physical was                         performed, and patient medications and allergies were                         reviewed. The patient is competent. The risks and                         benefits of the procedure and the sedation options and                         risks were discussed with the patient. All questions                         were answered and informed consent was obtained.                         Patient identification and proposed procedure were                         verified by the physician, the nurse, the anesthetist                         and the technician in the endoscopy suite. Mental                         Status Examination: alert and oriented. Airway                         Examination: normal oropharyngeal airway and neck                         mobility. Respiratory Examination: clear to                         auscultation. CV Examination: RRR, no murmurs, no S3  or S4. Prophylactic Antibiotics: The patient does not                         require prophylactic antibiotics. Prior                         Anticoagulants: The patient has taken no anticoagulant                          or antiplatelet agents. ASA Grade Assessment: III - A                         patient with severe systemic disease. After reviewing                         the risks and benefits, the patient was deemed in                         satisfactory condition to undergo the procedure. The                         anesthesia plan was to use monitored anesthesia care                         (MAC). Immediately prior to administration of                         medications, the patient was re-assessed for adequacy                         to receive sedatives. The heart rate, respiratory                         rate, oxygen saturations, blood pressure, adequacy of                         pulmonary ventilation, and response to care were                         monitored throughout the procedure. The physical                         status of the patient was re-assessed after the                         procedure.                        After obtaining informed consent, the endoscope was                         passed under direct vision. Throughout the procedure,                         the patient's blood pressure, pulse, and oxygen                         saturations were monitored continuously. The Endoscope  was introduced through the mouth, and advanced to the                         second part of duodenum. The upper GI endoscopy was                         accomplished without difficulty. The patient tolerated                         the procedure fairly well. Findings:      The duodenal bulb, first portion of the duodenum and second portion of       the duodenum were normal. no signs of avms or blood in the duodenum       Estimated blood loss: none.      Mild portal hypertensive gastropathy was found in the gastric body.       Estimated blood loss: none.      Multiple 1 to 22 mm pedunculated and sessile polyps with no bleeding and       stigmata of recent  bleeding were found in the gastric body. The polyp       was removed with a saline injection-lift technique using a hot snare.       Resection and retrieval were complete. To prevent bleeding after the       polypectomy, two hemostatic clips were successfully placed (MR       conditional). There was no bleeding at the end of the procedure. Gold       probe cautery for hemostasis of bleeding caused by the procedure using       heater probe was successful. Estimated blood loss was minimal.      The exam of the stomach was otherwise normal.      The Z-line was regular. Estimated blood loss: none.      A widely patent Schatzki ring was found at the gastroesophageal       junction. Estimated blood loss: none.      Grade I varices were found in the lower third of the esophagus.       Estimated blood loss: none.      The exam of the esophagus was otherwise normal.      A small hiatal hernia was present. Estimated blood loss: none. Impression:            - Normal duodenal bulb, first portion of the duodenum                         and second portion of the duodenum.                        - Portal hypertensive gastropathy.                        - Multiple gastric polyps. Resected and retrieved.                         Clips (MR conditional) were placed. Treated with a                         heater probe.                        -  Z-line regular.                        - Widely patent Schatzki ring.                        - Grade I esophageal varices.                        - Small hiatal hernia. Recommendation:        - Patient has a contact number available for                         emergencies. The signs and symptoms of potential                         delayed complications were discussed with the patient.                         Return to normal activities tomorrow. Written                         discharge instructions were provided to the patient.                        - Discharge  patient to home.                        - Resume previous diet.                        - Continue present medications.                        - No aspirin, ibuprofen, naproxen, or other                         non-steroidal anti-inflammatory drugs after polyp                         removal.                        - Await pathology results.                        - Return to GI clinic as previously scheduled.                        - The findings and recommendations were discussed with                         the patient's family.                        - The findings and recommendations were discussed with                         the patient. Procedure Code(s):     --- Professional ---                        [phone], Esophagogastroduodenoscopy, flexible,  transoral; with removal of tumor(s), polyp(s), or                         other lesion(s) by snare technique                        [id number], Esophagogastroduodenoscopy, flexible,                         transoral; with directed submucosal injection(s), any                         substance Diagnosis Code(s):     --- Professional ---                        K76.6, Portal hypertension                        K31.89, Other diseases of stomach and duodenum                        K31.7, Polyp of stomach and duodenum                        K22.2, Esophageal obstruction                        I85.00, Esophageal varices without bleeding                        K44.9, Diaphragmatic hernia without obstruction or                         gangrene                        D50.9, Iron deficiency anemia, unspecified CPT copyright 2022 American [employer] Association. All rights reserved. The codes documented in this report are preliminary and upon coder review may  be revised to meet current compliance requirements. Attending Participation:      I personally performed the entire procedure. Volney American, DO Annamaria Helling DO,  DO 07/02/2022 11:04:09 AM This report has been signed electronically. Number of Addenda: 0 Note Initiated On: 07/02/2022 10:08 AM Estimated Blood Loss:  Estimated blood loss was minimal.      [facility]

## 2022-07-02 NOTE — Interval H&P Note (Signed)
History and Physical Interval Note: Preprocedure H&P from 07/02/22  was reviewed and there was no interval change after seeing and examining the patient.  Written consent was obtained from the patient after discussion of risks, benefits, and alternatives. 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  The various methods of treatment have been discussed with the patient and family. After consideration of risks, benefits and other options for treatment, the patient has consented to  Procedure(s): ESOPHAGOGASTRODUODENOSCOPY (EGD) (N/A) as a surgical intervention.  The patient's history has been reviewed, patient examined, no change in status, stable for surgery.  I have reviewed the patient's chart and labs.  Questions were answered to the patient's satisfaction.     Annamaria Helling

## 2022-07-02 NOTE — Transfer of Care (Signed)
Immediate Anesthesia Transfer of Care Note  Patient: Brenda Beasley  Procedure(s) Performed: ESOPHAGOGASTRODUODENOSCOPY (EGD)  Patient Location: PACU  Anesthesia Type:General  Level of Consciousness: awake, alert , and oriented  Airway & Oxygen Therapy: Patient Spontanous Breathing and Patient connected to nasal cannula oxygen  Post-op Assessment: Report given to RN, Post -op Vital signs reviewed and stable, and Patient moving all extremities  Post vital signs: Reviewed and stable  Last Vitals:  Vitals Value Taken Time  BP 136/80 07/02/22 1103  Temp    Pulse 97 07/02/22 1103  Resp 33 07/02/22 1103  SpO2 97 % 07/02/22 1103  Vitals shown include unvalidated device data.  Last Pain:  Vitals:   07/02/22 0902  TempSrc: Temporal         Complications: No notable events documented.

## 2022-07-02 NOTE — H&P (Signed)
Pre-Procedure H&P   Patient ID: Brenda Beasley is a 72 y.o. female.  Gastroenterology Provider: Annamaria Helling, DO  Referring Provider: Octavia Bruckner, PA PCP: Adin Hector, MD  Date: 07/02/2022  HPI Ms. Brenda Beasley is a 72 y.o. female who presents today for Esophagogastroduodenoscopy for cirrhosis, anemia .  Patient with a history of cirrhosis with portal hypertension.  Last underwent EGD with portal hypertensive gastropathy and gastric polyps.  Previous gastric polyps demonstrated sarcomatous.  She also underwent EGD in August 2022 demonstrating AVMs in the duodenum x 2 which were treated with APC.  Suspected grade 1 esophageal varices at that point in time along with a Schatzki's ring.  Duodenal biopsies were negative.  Still with symptoms of pica and fatigue despite receiving IV iron infusions from hematology.  Her chronic diarrhea has improved. She denies any melena hematochezia hematemesis or coffee ground emesis.   Most recent lab work INR 1.2 hemoglobin stable at 11.2 MCV 83 platelets 134,000    Past Medical History:  Diagnosis Date   Anxiety    Asthma    Chronic pruritus    Cirrhosis of liver (HCC)    COPD (chronic obstructive pulmonary disease) ([place])    Diabetes mellitus without complication ([place])    Family history of breast cancer    Family history of colon cancer    Family history of kidney cancer    Family history of lung cancer    GERD (gastroesophageal reflux disease)    Hamartomatous polyp of large intestine (HCC)    Hematuria, microscopic    History of uterine fibroid    History of ventral hernia    Hyperlipidemia    Hypertension    IBS (irritable bowel syndrome)    IDA (iron deficiency anemia) 11/13/2020   Osteoporosis with current pathological fracture 11/03/2018   Personal history of tobacco use, presenting hazards to health 09/20/2015   Proteinuria    Stroke ([place])    Tobacco abuse 10/04/2014   Vitamin B 12 deficiency    Vitamin  D deficiency     Past Surgical History:  Procedure Laterality Date   COLON SURGERY     COLONOSCOPY N/A 01/09/2021   Procedure: COLONOSCOPY;  Surgeon: Annamaria Helling, DO;  Location: [facility] ENDOSCOPY;  Service: Gastroenterology;  Laterality: N/A;   COLONOSCOPY WITH PROPOFOL N/A 11/23/2018   Procedure: COLONOSCOPY WITH PROPOFOL;  Surgeon: Toledo, Benay Pike, MD;  Location: [facility] ENDOSCOPY;  Service: Gastroenterology;  Laterality: N/A;   ESOPHAGOGASTRODUODENOSCOPY N/A 01/09/2021   Procedure: ESOPHAGOGASTRODUODENOSCOPY (EGD);  Surgeon: Annamaria Helling, DO;  Location: [facility] ENDOSCOPY;  Service: Gastroenterology;  Laterality: N/A;   ESOPHAGOGASTRODUODENOSCOPY (EGD) WITH PROPOFOL N/A 11/23/2018   Procedure: ESOPHAGOGASTRODUODENOSCOPY (EGD) WITH PROPOFOL;  Surgeon: Toledo, Benay Pike, MD;  Location: [facility] ENDOSCOPY;  Service: Gastroenterology;  Laterality: N/A;   ESOPHAGOGASTRODUODENOSCOPY (EGD) WITH PROPOFOL N/A 01/17/2021   Procedure: ESOPHAGOGASTRODUODENOSCOPY (EGD) WITH PROPOFOL;  Surgeon: Lin Landsman, MD;  Location: [facility] ENDOSCOPY;  Service: Gastroenterology;  Laterality: N/A;   HERNIA REPAIR     PARTIAL COLECTOMY     TONSILLECTOMY     VENTRAL HERNIA REPAIR      Family History No h/o GI disease or malignancy  Review of Systems  Constitutional:  Negative for activity change, appetite change, chills, diaphoresis, fatigue, fever and unexpected weight change.  HENT:  Negative for trouble swallowing and voice change.   Respiratory:  Negative for shortness of breath and wheezing.   Cardiovascular:  Negative for chest pain, palpitations and  leg swelling.  Gastrointestinal:  Negative for abdominal distention, abdominal pain, anal bleeding, blood in stool, constipation, diarrhea, nausea, rectal pain and vomiting.  Musculoskeletal:  Negative for arthralgias and myalgias.  Skin:  Negative for color change and pallor.  Neurological:  Positive for weakness and light-headedness. Negative for  dizziness and syncope.  Psychiatric/Behavioral:  Negative for confusion.   All other systems reviewed and are negative.    Medications No current facility-administered medications on file prior to encounter.   Current Outpatient Medications on File Prior to Encounter  Medication Sig Dispense Refill   gabapentin (NEURONTIN) 100 MG capsule Take 100 mg by mouth at bedtime as needed.     glipiZIDE (GLUCOTROL) 10 MG tablet Take 20 mg by mouth 2 (two) times daily.     hydrochlorothiazide (HYDRODIURIL) 25 MG tablet Hold until followup with outpatient doctor due to intermittent low blood pressure.     Insulin Glargine, 2 Unit Dial, (TOUJEO MAX SOLOSTAR) 300 UNIT/ML SOPN Inject 300 Units/mL into the skin at bedtime.     lisinopril (ZESTRIL) 40 MG tablet Hold until followup with outpatient doctor due to intermittent low blood pressure.     metFORMIN (GLUCOPHAGE) 1000 MG tablet Take 1,000 mg by mouth 2 (two) times daily.     omeprazole (PRILOSEC) 20 MG capsule Take 20 mg by mouth 2 (two) times daily.     Cholecalciferol (VITAMIN D3) 5000 units TABS Take 5,000 Units by mouth daily.      cyanocobalamin (,VITAMIN B-12,) 1000 MCG/ML injection Inject 1,000 mcg into the muscle every 30 (thirty) days. (Patient not taking: Reported on 07/08/2021)     ferrous sulfate 325 (65 FE) MG tablet Take 325 mg by mouth daily. (Patient not taking: Reported on 07/08/2021)     fluticasone (FLONASE) 50 MCG/ACT nasal spray Place 2 sprays into the nose daily. (Patient not taking: Reported on 07/08/2021)     ipratropium-albuterol (DUONEB) 0.5-2.5 (3) MG/3ML SOLN Take 3 mLs 4 (four) times daily as needed by nebulization. (Patient not taking: Reported on 07/08/2021)     lovastatin (MEVACOR) 40 MG tablet Take 2 tablets by mouth daily.     MAGNESIUM PO Take 1 tablet by mouth at bedtime.     vitamin C (ASCORBIC ACID) 250 MG tablet Take 250 mg by mouth daily.      Pertinent medications related to GI and procedure were reviewed by me  with the patient prior to the procedure   Current Facility-Administered Medications:    0.9 %  sodium chloride infusion, , Intravenous, Continuous, Annamaria Helling, DO, Last Rate: 20 mL/hr at 07/02/22 0916, New Bag at 07/02/22 0916      Allergies  Allergen Reactions   Molds & Smuts Shortness Of Breath   Other Shortness Of Breath and Hives    Cats hives Dust causes sneezing Affects breathing Tape-unknown   Penicillins Hives and Swelling    Throat swell  Has patient had a PCN reaction causing immediate rash, facial/tongue/throat swelling, SOB or lightheadedness with hypotension: Yes Has patient had a PCN reaction causing severe rash involving mucus membranes or skin necrosis: Unknown Has patient had a PCN reaction that required hospitalization: Unknown Has patient had a PCN reaction occurring within the last 10 years: Unknown If all of the above answers are "NO", then may proceed with Cephalosporin use.    Pioglitazone Other (See Comments) and Palpitations    Chest pain, numbness in fingers,    Byetta 10 Mcg Pen [Exenatide] Other (See Comments)    Severe  stomach pains   Dapagliflozin Other (See Comments)    Intolerant/dyspnea   Jardiance [Empagliflozin] Other (See Comments)   Victoza [Liraglutide] Other (See Comments)    Severe stomach pains   Sulfa Antibiotics Rash   Sulfasalazine Rash   Allergies were reviewed by me prior to the procedure  Objective   Body mass index is 34.74 kg/m. Vitals:   07/02/22 0902  BP: (!) 160/81  Pulse: 80  Resp: 16  Temp: (!) 96.8 F (36 C)  TempSrc: Temporal  SpO2: 98%  Weight: 78 kg  Height: 4' 11"$  (1.499 m)     Physical Exam Vitals and nursing note reviewed.  Constitutional:      General: She is not in acute distress.    Appearance: Normal appearance. She is not ill-appearing, toxic-appearing or diaphoretic.  HENT:     Head: Normocephalic and atraumatic.     Nose: Nose normal.     Mouth/Throat:     Mouth: Mucous  membranes are moist.     Pharynx: Oropharynx is clear.  Eyes:     General: No scleral icterus.    Extraocular Movements: Extraocular movements intact.  Cardiovascular:     Rate and Rhythm: Normal rate and regular rhythm.     Heart sounds: Normal heart sounds. No murmur heard.    No friction rub. No gallop.  Pulmonary:     Effort: Pulmonary effort is normal. No respiratory distress.     Breath sounds: Normal breath sounds. No wheezing, rhonchi or rales.  Abdominal:     General: Bowel sounds are normal. There is no distension.     Palpations: Abdomen is soft.     Tenderness: There is no abdominal tenderness. There is no guarding or rebound.  Musculoskeletal:     Cervical back: Neck supple.     Right lower leg: No edema.     Left lower leg: No edema.  Skin:    General: Skin is warm and dry.     Coloration: Skin is not jaundiced or pale.  Neurological:     General: No focal deficit present.     Mental Status: She is alert and oriented to person, place, and time. Mental status is at baseline.  Psychiatric:        Mood and Affect: Mood normal.        Behavior: Behavior normal.        Thought Content: Thought content normal.        Judgment: Judgment normal.      Assessment:  Ms. Brenda Beasley is a 72 y.o. female  who presents today for Esophagogastroduodenoscopy for cirrhosis, anemia .  Plan:  Esophagogastroduodenoscopy with possible intervention today  Esophagogastroduodenoscopy with possible biopsy, control of bleeding, polypectomy, and interventions as necessary has been discussed with the patient/patient representative. Informed consent was obtained from the patient/patient representative after explaining the indication, nature, and risks of the procedure including but not limited to death, bleeding, perforation, missed neoplasm/lesions, cardiorespiratory compromise, and reaction to medications. Opportunity for questions was given and appropriate answers were provided.  Patient/patient representative has verbalized understanding is amenable to undergoing the procedure.   Annamaria Helling, DO  [facility]  Portions of the record may have been created with voice recognition software. Occasional wrong-word or 'sound-a-like' substitutions may have occurred due to the inherent limitations of voice recognition software.  Read the chart carefully and recognize, using context, where substitutions may have occurred.

## 2022-07-03 ENCOUNTER — Encounter: Payer: Self-pay | Admitting: Gastroenterology

## 2022-07-03 LAB — SURGICAL PATHOLOGY

## 2022-07-06 ENCOUNTER — Inpatient Hospital Stay: Payer: Medicare Other | Attending: Oncology

## 2022-07-06 DIAGNOSIS — E119 Type 2 diabetes mellitus without complications: Secondary | ICD-10-CM | POA: Diagnosis not present

## 2022-07-06 DIAGNOSIS — K7460 Unspecified cirrhosis of liver: Secondary | ICD-10-CM | POA: Diagnosis not present

## 2022-07-06 DIAGNOSIS — I10 Essential (primary) hypertension: Secondary | ICD-10-CM | POA: Diagnosis not present

## 2022-07-06 DIAGNOSIS — D696 Thrombocytopenia, unspecified: Secondary | ICD-10-CM | POA: Diagnosis not present

## 2022-07-06 DIAGNOSIS — K766 Portal hypertension: Secondary | ICD-10-CM | POA: Insufficient documentation

## 2022-07-06 DIAGNOSIS — D509 Iron deficiency anemia, unspecified: Secondary | ICD-10-CM | POA: Diagnosis present

## 2022-07-06 DIAGNOSIS — Z87891 Personal history of nicotine dependence: Secondary | ICD-10-CM | POA: Diagnosis not present

## 2022-07-06 DIAGNOSIS — D508 Other iron deficiency anemias: Secondary | ICD-10-CM

## 2022-07-06 LAB — CBC WITH DIFFERENTIAL/PLATELET
Abs Immature Granulocytes: 0.02 10*3/uL (ref 0.00–0.07)
Basophils Absolute: 0 10*3/uL (ref 0.0–0.1)
Basophils Relative: 1 %
Eosinophils Absolute: 0.1 10*3/uL (ref 0.0–0.5)
Eosinophils Relative: 2 %
HCT: 32.7 % — ABNORMAL LOW (ref 36.0–46.0)
Hemoglobin: 9.8 g/dL — ABNORMAL LOW (ref 12.0–15.0)
Immature Granulocytes: 1 %
Lymphocytes Relative: 19 %
Lymphs Abs: 0.8 10*3/uL (ref 0.7–4.0)
MCH: 23.8 pg — ABNORMAL LOW (ref 26.0–34.0)
MCHC: 30 g/dL (ref 30.0–36.0)
MCV: 79.6 fL — ABNORMAL LOW (ref 80.0–100.0)
Monocytes Absolute: 0.4 10*3/uL (ref 0.1–1.0)
Monocytes Relative: 8 %
Neutro Abs: 3 10*3/uL (ref 1.7–7.7)
Neutrophils Relative %: 69 %
Platelets: 120 10*3/uL — ABNORMAL LOW (ref 150–400)
RBC: 4.11 MIL/uL (ref 3.87–5.11)
RDW: 15.9 % — ABNORMAL HIGH (ref 11.5–15.5)
WBC: 4.4 10*3/uL (ref 4.0–10.5)
nRBC: 0 % (ref 0.0–0.2)

## 2022-07-06 LAB — COMPREHENSIVE METABOLIC PANEL
ALT: 17 U/L (ref 0–44)
AST: 21 U/L (ref 15–41)
Albumin: 3.7 g/dL (ref 3.5–5.0)
Alkaline Phosphatase: 53 U/L (ref 38–126)
Anion gap: 11 (ref 5–15)
BUN: 19 mg/dL (ref 8–23)
CO2: 26 mmol/L (ref 22–32)
Calcium: 8.8 mg/dL — ABNORMAL LOW (ref 8.9–10.3)
Chloride: 97 mmol/L — ABNORMAL LOW (ref 98–111)
Creatinine, Ser: 1 mg/dL (ref 0.44–1.00)
GFR, Estimated: 60 mL/min — ABNORMAL LOW (ref >60)
Glucose, Bld: 228 mg/dL — ABNORMAL HIGH (ref 70–99)
Potassium: 3.7 mmol/L (ref 3.5–5.1)
Sodium: 134 mmol/L — ABNORMAL LOW (ref 135–145)
Total Bilirubin: 0.6 mg/dL (ref 0.3–1.2)
Total Protein: 6.9 g/dL (ref 6.5–8.1)

## 2022-07-06 LAB — FERRITIN: Ferritin: 18 ng/mL (ref 11–307)

## 2022-07-06 LAB — RETIC PANEL
Immature Retic Fract: 12.5 % (ref 2.3–15.9)
RBC.: 4.03 MIL/uL (ref 3.87–5.11)
Retic Count, Absolute: 61.3 10*3/uL (ref 19.0–186.0)
Retic Ct Pct: 1.5 % (ref 0.4–3.1)
Reticulocyte Hemoglobin: 24.5 pg — ABNORMAL LOW (ref >27.9)

## 2022-07-06 LAB — IRON AND TIBC
Iron: 35 ug/dL (ref 28–170)
Saturation Ratios: 7 % — ABNORMAL LOW (ref 10.4–31.8)
TIBC: 480 ug/dL — ABNORMAL HIGH (ref 250–450)
UIBC: 445 ug/dL

## 2022-07-06 LAB — PROTIME-INR
INR: 1.1 (ref 0.8–1.2)
Prothrombin Time: 14.4 seconds (ref 11.4–15.2)

## 2022-07-06 LAB — APTT: aPTT: 32 seconds (ref 24–36)

## 2022-07-07 LAB — AFP TUMOR MARKER: AFP, Serum, Tumor Marker: 2.7 ng/mL (ref 0.0–9.2)

## 2022-07-09 ENCOUNTER — Inpatient Hospital Stay (HOSPITAL_BASED_OUTPATIENT_CLINIC_OR_DEPARTMENT_OTHER): Payer: Medicare Other | Admitting: Oncology

## 2022-07-09 ENCOUNTER — Encounter: Payer: Self-pay | Admitting: Oncology

## 2022-07-09 ENCOUNTER — Inpatient Hospital Stay: Payer: Medicare Other

## 2022-07-09 VITALS — BP 163/78 | HR 79 | Temp 97.4°F | Wt 174.9 lb

## 2022-07-09 VITALS — BP 160/70 | HR 74 | Resp 18

## 2022-07-09 DIAGNOSIS — D508 Other iron deficiency anemias: Secondary | ICD-10-CM

## 2022-07-09 DIAGNOSIS — D696 Thrombocytopenia, unspecified: Secondary | ICD-10-CM

## 2022-07-09 DIAGNOSIS — Z87891 Personal history of nicotine dependence: Secondary | ICD-10-CM

## 2022-07-09 DIAGNOSIS — K7460 Unspecified cirrhosis of liver: Secondary | ICD-10-CM | POA: Diagnosis not present

## 2022-07-09 DIAGNOSIS — D509 Iron deficiency anemia, unspecified: Secondary | ICD-10-CM | POA: Diagnosis not present

## 2022-07-09 MED ORDER — SODIUM CHLORIDE 0.9 % IV SOLN
INTRAVENOUS | Status: DC
  Filled 2022-07-09: qty 250

## 2022-07-09 MED ORDER — SODIUM CHLORIDE 0.9 % IV SOLN
200.0000 mg | Freq: Once | INTRAVENOUS | Status: AC
  Administered 2022-07-09: 200 mg via INTRAVENOUS
  Filled 2022-07-09: qty 200

## 2022-07-09 NOTE — Assessment & Plan Note (Signed)
#   Former smoker,   Continue annual CT screening

## 2022-07-09 NOTE — Assessment & Plan Note (Signed)
#   Iron deficiency anemia.  Labs reviewed and discussed with patient Persistent iron deficiency anemia. Recommend IV Venofer weekly x4.

## 2022-07-09 NOTE — Anesthesia Postprocedure Evaluation (Signed)
Anesthesia Post Note  Patient: Diane A Lanzo  Procedure(s) Performed: ESOPHAGOGASTRODUODENOSCOPY (EGD)  Patient location during evaluation: Endoscopy Anesthesia Type: General Level of consciousness: awake and alert Pain management: pain level controlled Vital Signs Assessment: post-procedure vital signs reviewed and stable Respiratory status: spontaneous breathing, nonlabored ventilation, respiratory function stable and patient connected to nasal cannula oxygen Cardiovascular status: blood pressure returned to baseline and stable Postop Assessment: no apparent nausea or vomiting Anesthetic complications: no   No notable events documented.   Last Vitals:  Vitals:   07/02/22 1113 07/02/22 1123  BP: (!) 143/76 (!) 140/67  Pulse: 90   Resp: 17 20  Temp:    SpO2: 95% 98%    Last Pain:  Vitals:   07/03/22 0735  TempSrc:   PainSc: 1                  Martha Clan

## 2022-07-09 NOTE — Assessment & Plan Note (Signed)
#   Cirrhosis/ portal hypertension gastropathy.  Recommend patient to  follow-up with gastroenterology

## 2022-07-09 NOTE — Assessment & Plan Note (Signed)
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  Counts are stable.

## 2022-07-09 NOTE — Progress Notes (Signed)
Hematology/Oncology progress note [phone]     CHIEF COMPLAINTS/REASON FOR VISIT:  iron deficiency anemia   ASSESSMENT & PLAN:   IDA (iron deficiency anemia) # Iron deficiency anemia.  Labs reviewed and discussed with patient Persistent iron deficiency anemia. Recommend IV Venofer weekly x4.   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  Counts are stable.  Personal history of tobacco use, presenting hazards to health # Former smoker,   Continue annual CT screening   Orders Placed This Encounter  Procedures   CBC with Differential ([place] Only)    Standing Status:   Future    Standing Expiration Date:   07/10/2023   Iron and TIBC    Standing Status:   Future    Standing Expiration Date:   07/10/2023   Ferritin    Standing Status:   Future    Standing Expiration Date:   07/10/2023   AFP tumor marker    Standing Status:   Future    Standing Expiration Date:   07/10/2023   Retic Panel    Standing Status:   Future    Standing Expiration Date:   07/10/2023   Follow up in 6 months All questions were answered. The patient knows to call the clinic with any problems, questions or concerns.  Earlie Server, MD, PhD [facility] Hematology Oncology 07/09/2022   HISTORY OF PRESENTING ILLNESS:  Brenda Beasley is a  72 y.o.  female with PMH listed below was seen in consultation at the request of Adin Hector, MD   for evaluation of iron deficiency anemia.   Reviewed patient's recent labs  6/21/202 Labs revealed anemia with hemoglobin of 9.1, MCV 76, normal total wbc and platlet Reviewed patient's previous labs ordered by primary care physician's office, anemia is chronic onset , duration is since 2019. Basline above 10 No aggravating or improving factors.  Associated signs and  symptoms: Patient reports fatigue.  Mild SOB with exertion.  Denies weight loss, easy bruising, hematochezia, hemoptysis, hematuria. Context:  History of iron deficiency: long standing history of IDA. Takes oral iron supplementation Rectal bleeding: deneis.  Menstrual bleeding/ Vaginal bleeding : denies Hematemesis or hemoptysis : denies Blood in urine : denies   History partial colectomy about 15 years due to large polyp. Per patient, pathology showed pre-cancer polyp.  Family history is positive for colon cancer in mother and breast cancer in sister.  Accompanied by her son.  She craves for ice chips.   admission from 01/16/2021 - 01/17/2021 due to reported hematemesis which was later felt to be episode of epistaxis. Patient has heme positive stool 01/09/2021 upper EGD and colonoscopy.  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  Small hiatal hernia and grade 1 esophageal varices.  INTERVAL HISTORY Brenda Beasley is a 72 y.o. female who has above history reviewed by me today presents for follow up visit for  iron deficiency anemia.  Patient previously tolerates IV Venofer treatments. Chronic fatigue, unchanged.    Review of Systems  Constitutional:  Positive for fatigue. Negative for appetite change, chills, fever and unexpected weight change.  HENT:   Negative for hearing loss and voice change.   Eyes:  Negative for eye problems.  Respiratory:  Negative for chest tightness, cough and shortness of breath.  Cardiovascular:  Negative for chest pain.  Gastrointestinal:  Positive for blood in stool. Negative for abdominal distention and abdominal pain.  Endocrine: Negative for hot flashes.  Genitourinary:  Negative for difficulty urinating and frequency.   Musculoskeletal:  Positive for back pain. Negative for arthralgias.  Skin:  Negative for itching and rash.  Neurological:  Negative for  extremity weakness.  Hematological:  Negative for adenopathy.  Psychiatric/Behavioral:  Negative for confusion.     MEDICAL HISTORY:  Past Medical History:  Diagnosis Date   Anxiety    Asthma    Chronic pruritus    Cirrhosis of liver (HCC)    COPD (chronic obstructive pulmonary disease) ([place])    Diabetes mellitus without complication ([place])    Family history of breast cancer    Family history of colon cancer    Family history of kidney cancer    Family history of lung cancer    GERD (gastroesophageal reflux disease)    Hamartomatous polyp of large intestine (HCC)    Hematuria, microscopic    History of uterine fibroid    History of ventral hernia    Hyperlipidemia    Hypertension    IBS (irritable bowel syndrome)    IDA (iron deficiency anemia) 11/13/2020   Osteoporosis with current pathological fracture 11/03/2018   Personal history of tobacco use, presenting hazards to health 09/20/2015   Proteinuria    Stroke ([place])    Tobacco abuse 10/04/2014   Vitamin B 12 deficiency    Vitamin D deficiency     SURGICAL HISTORY: Past Surgical History:  Procedure Laterality Date   COLON SURGERY     COLONOSCOPY N/A 01/09/2021   Procedure: COLONOSCOPY;  Surgeon: Annamaria Helling, DO;  Location: [facility] ENDOSCOPY;  Service: Gastroenterology;  Laterality: N/A;   COLONOSCOPY WITH PROPOFOL N/A 11/23/2018   Procedure: COLONOSCOPY WITH PROPOFOL;  Surgeon: Toledo, Benay Pike, MD;  Location: [facility] ENDOSCOPY;  Service: Gastroenterology;  Laterality: N/A;   ESOPHAGOGASTRODUODENOSCOPY N/A 01/09/2021   Procedure: ESOPHAGOGASTRODUODENOSCOPY (EGD);  Surgeon: Annamaria Helling, DO;  Location: [facility] ENDOSCOPY;  Service: Gastroenterology;  Laterality: N/A;   ESOPHAGOGASTRODUODENOSCOPY N/A 07/02/2022   Procedure: ESOPHAGOGASTRODUODENOSCOPY (EGD);  Surgeon: Annamaria Helling, DO;  Location: [facility] ENDOSCOPY;  Service: Gastroenterology;  Laterality: N/A;   ESOPHAGOGASTRODUODENOSCOPY (EGD) WITH PROPOFOL  N/A 11/23/2018   Procedure: ESOPHAGOGASTRODUODENOSCOPY (EGD) WITH PROPOFOL;  Surgeon: Toledo, Benay Pike, MD;  Location: [facility] ENDOSCOPY;  Service: Gastroenterology;  Laterality: N/A;   ESOPHAGOGASTRODUODENOSCOPY (EGD) WITH PROPOFOL N/A 01/17/2021   Procedure: ESOPHAGOGASTRODUODENOSCOPY (EGD) WITH PROPOFOL;  Surgeon: Lin Landsman, MD;  Location: [facility] ENDOSCOPY;  Service: Gastroenterology;  Laterality: N/A;   HERNIA REPAIR     PARTIAL COLECTOMY     TONSILLECTOMY     VENTRAL HERNIA REPAIR      SOCIAL HISTORY: Social History   Socioeconomic History   Marital status: Widowed    Spouse name: Not on file   Number of children: Not on file   Years of education: Not on file   Highest education level: Not on file  Occupational History   Not on file  Tobacco Use   Smoking status: Former    Packs/day: 1.00    Years: 52.00    Total pack years: 52.00    Types: Cigarettes    Quit date: 06/12/2018    Years since quitting: 4.0   Smokeless tobacco: Never  Vaping Use   Vaping Use: Never used  Substance and Sexual Activity   Alcohol use: No    Alcohol/week: 0.0  standard drinks of alcohol   Drug use: Not Currently   Sexual activity: Not on file  Other Topics Concern   Not on file  Social History Narrative   Not on file   Social Determinants of Health   Financial Resource Strain: Not on file  Food Insecurity: Not on file  Transportation Needs: Not on file  Physical Activity: Not on file  Stress: Not on file  Social Connections: Not on file  Intimate Partner Violence: Not on file    FAMILY HISTORY: Family History  Problem Relation Age of Onset   Diabetes Mother    Diabetes Father    Diabetes Sister    Diabetes Maternal Grandmother    Diabetes Paternal Grandmother     ALLERGIES:  is allergic to molds & smuts, other, penicillins, pioglitazone, byetta 10 mcg pen [exenatide], dapagliflozin, jardiance [empagliflozin], victoza [liraglutide], sulfa antibiotics, and  sulfasalazine.  MEDICATIONS:  Current Outpatient Medications  Medication Sig Dispense Refill   Cholecalciferol (VITAMIN D3) 5000 units TABS Take 5,000 Units by mouth daily.      gabapentin (NEURONTIN) 100 MG capsule Take 100 mg by mouth at bedtime as needed.     glipiZIDE (GLUCOTROL) 10 MG tablet Take 20 mg by mouth 2 (two) times daily.     hydrochlorothiazide (HYDRODIURIL) 25 MG tablet Hold until followup with outpatient doctor due to intermittent low blood pressure.     Insulin Glargine, 2 Unit Dial, (TOUJEO MAX SOLOSTAR) 300 UNIT/ML SOPN Inject 300 Units/mL into the skin at bedtime.     lisinopril (ZESTRIL) 40 MG tablet Hold until followup with outpatient doctor due to intermittent low blood pressure.     lovastatin (MEVACOR) 40 MG tablet Take 2 tablets by mouth daily.     MAGNESIUM PO Take 1 tablet by mouth at bedtime.     metFORMIN (GLUCOPHAGE) 1000 MG tablet Take 1,000 mg by mouth 2 (two) times daily.     omeprazole (PRILOSEC) 20 MG capsule Take 20 mg by mouth 2 (two) times daily.     vitamin C (ASCORBIC ACID) 250 MG tablet Take 250 mg by mouth daily.     amLODipine (NORVASC) 2.5 MG tablet Take 2.5 mg by mouth daily.     cyanocobalamin (,VITAMIN B-12,) 1000 MCG/ML injection Inject 1,000 mcg into the muscle every 30 (thirty) days. (Patient not taking: Reported on 07/08/2021)     ferrous sulfate 325 (65 FE) MG tablet Take 325 mg by mouth daily. (Patient not taking: Reported on 07/08/2021)     fluticasone (FLONASE) 50 MCG/ACT nasal spray Place 2 sprays into the nose daily. (Patient not taking: Reported on 07/08/2021)     ipratropium-albuterol (DUONEB) 0.5-2.5 (3) MG/3ML SOLN Take 3 mLs 4 (four) times daily as needed by nebulization. (Patient not taking: Reported on 07/08/2021)     PULMICORT FLEXHALER 180 MCG/ACT inhaler Inhale 1 puff into the lungs 2 (two) times daily.     No current facility-administered medications for this visit.     PHYSICAL EXAMINATION: ECOG PERFORMANCE STATUS: 1 -  Symptomatic but completely ambulatory Vitals:   07/09/22 1432  BP: (!) 163/78  Pulse: 79  Temp: (!) 97.4 F (36.3 C)  SpO2: 97%   Filed Weights   07/09/22 1432  Weight: 174 lb 14.4 oz (79.3 kg)    Physical Exam Constitutional:      General: She is not in acute distress.    Appearance: She is obese.     Comments: She walks independently.   HENT:  Head: Normocephalic and atraumatic.  Eyes:     General: No scleral icterus. Cardiovascular:     Rate and Rhythm: Normal rate and regular rhythm.     Heart sounds: Normal heart sounds.  Pulmonary:     Effort: Pulmonary effort is normal. No respiratory distress.     Breath sounds: No wheezing.  Abdominal:     General: Bowel sounds are normal. There is no distension.     Palpations: Abdomen is soft.  Musculoskeletal:        General: No deformity. Normal range of motion.     Cervical back: Normal range of motion and neck supple.  Skin:    General: Skin is warm and dry.     Findings: No erythema or rash.  Neurological:     Mental Status: She is alert and oriented to person, place, and time. Mental status is at baseline.     Cranial Nerves: No cranial nerve deficit.     Coordination: Coordination normal.  Psychiatric:        Mood and Affect: Mood normal.      Laboratory studies I have reviewed the data as listed    Latest Ref Rng & Units 07/06/2022   11:03 AM  CBC  WBC 4.0 - 10.5 K/uL 4.4   Hemoglobin 12.0 - 15.0 g/dL 9.8   Hematocrit 36.0 - 46.0 % 32.7   Platelets 150 - 400 K/uL 120        Latest Ref Rng & Units 07/06/2022   11:03 AM 01/16/2021    1:23 PM 06/23/2018   12:02 AM  CMP  Glucose 70 - 99 mg/dL 228  90  160   BUN 8 - 23 mg/dL 19  15  20   $ Creatinine 0.44 - 1.00 mg/dL 1.00  0.95  0.97   Sodium 135 - 145 mmol/L 134  139  136   Potassium 3.5 - 5.1 mmol/L 3.7  3.6  4.2   Chloride 98 - 111 mmol/L 97  102  98   CO2 22 - 32 mmol/L 26  26  27   $ Calcium 8.9 - 10.3 mg/dL 8.8  9.4  9.3   Total Protein 6.5 - 8.1  g/dL 6.9  7.7  7.1   Total Bilirubin 0.3 - 1.2 mg/dL 0.6  0.6  0.8   Alkaline Phos 38 - 126 U/L 53  66  61   AST 15 - 41 U/L 21  26  30   $ ALT 0 - 44 U/L 17  16  23     $ Iron/TIBC/Ferritin/ %Sat    Component Value Date/Time   IRON 35 07/06/2022 1103   TIBC 480 (H) 07/06/2022 1103   FERRITIN 18 07/06/2022 1103   IRONPCTSAT 7 (L) 07/06/2022 1103     RADIOGRAPHIC STUDIES: I have personally reviewed the radiological images as listed and agreed with the findings in the report. No results found.

## 2022-07-09 NOTE — Patient Instructions (Signed)

## 2022-07-16 ENCOUNTER — Inpatient Hospital Stay: Payer: Medicare Other

## 2022-07-16 VITALS — BP 146/89 | HR 80 | Temp 99.3°F | Resp 18

## 2022-07-16 DIAGNOSIS — D509 Iron deficiency anemia, unspecified: Secondary | ICD-10-CM | POA: Diagnosis not present

## 2022-07-16 DIAGNOSIS — D508 Other iron deficiency anemias: Secondary | ICD-10-CM

## 2022-07-16 MED ORDER — SODIUM CHLORIDE 0.9 % IV SOLN
200.0000 mg | Freq: Once | INTRAVENOUS | Status: AC
  Administered 2022-07-16: 200 mg via INTRAVENOUS
  Filled 2022-07-16: qty 200

## 2022-07-16 MED ORDER — SODIUM CHLORIDE 0.9 % IV SOLN
INTRAVENOUS | Status: DC | PRN
  Filled 2022-07-16: qty 250

## 2022-07-16 NOTE — Progress Notes (Signed)
Patient declined to stay for the 30 minutes post infusion observation today.

## 2022-07-23 ENCOUNTER — Inpatient Hospital Stay: Payer: Medicare Other | Attending: Oncology

## 2022-07-23 VITALS — BP 162/74 | HR 72 | Temp 97.2°F | Resp 17

## 2022-07-23 DIAGNOSIS — D509 Iron deficiency anemia, unspecified: Secondary | ICD-10-CM | POA: Diagnosis present

## 2022-07-23 DIAGNOSIS — D696 Thrombocytopenia, unspecified: Secondary | ICD-10-CM | POA: Diagnosis not present

## 2022-07-23 DIAGNOSIS — Z87891 Personal history of nicotine dependence: Secondary | ICD-10-CM | POA: Diagnosis not present

## 2022-07-23 DIAGNOSIS — D508 Other iron deficiency anemias: Secondary | ICD-10-CM

## 2022-07-23 MED ORDER — SODIUM CHLORIDE 0.9 % IV SOLN
INTRAVENOUS | Status: DC
  Filled 2022-07-23: qty 250

## 2022-07-23 MED ORDER — SODIUM CHLORIDE 0.9 % IV SOLN
200.0000 mg | Freq: Once | INTRAVENOUS | Status: AC
  Administered 2022-07-23: 200 mg via INTRAVENOUS
  Filled 2022-07-23: qty 200

## 2022-07-23 NOTE — Patient Instructions (Signed)

## 2022-07-23 NOTE — Progress Notes (Signed)
Refused 30 minute observation post- venofer infusion. Aware of potential adverse effects and when to seek medical attention. Vital signs stable at discharge.

## 2022-07-30 ENCOUNTER — Inpatient Hospital Stay: Payer: Medicare Other

## 2022-07-30 VITALS — BP 167/79 | HR 75 | Temp 97.4°F | Resp 18

## 2022-07-30 DIAGNOSIS — D508 Other iron deficiency anemias: Secondary | ICD-10-CM

## 2022-07-30 DIAGNOSIS — D509 Iron deficiency anemia, unspecified: Secondary | ICD-10-CM | POA: Diagnosis not present

## 2022-07-30 MED ORDER — SODIUM CHLORIDE 0.9 % IV SOLN
200.0000 mg | Freq: Once | INTRAVENOUS | Status: AC
  Administered 2022-07-30: 200 mg via INTRAVENOUS
  Filled 2022-07-30: qty 200

## 2022-07-30 MED ORDER — SODIUM CHLORIDE 0.9 % IV SOLN
INTRAVENOUS | Status: DC
  Filled 2022-07-30: qty 250

## 2022-07-30 NOTE — Progress Notes (Signed)
Pt tolerated treatment without complaints.  VSS.  Pt refused 30 minute post observation.  Pt understands to present to ED for any signs and symptoms of reaction.   

## 2022-07-30 NOTE — Patient Instructions (Signed)

## 2022-08-06 ENCOUNTER — Inpatient Hospital Stay: Payer: Medicare Other

## 2022-08-06 VITALS — BP 173/59 | HR 72 | Temp 97.9°F | Resp 18

## 2022-08-06 DIAGNOSIS — D508 Other iron deficiency anemias: Secondary | ICD-10-CM

## 2022-08-06 DIAGNOSIS — D509 Iron deficiency anemia, unspecified: Secondary | ICD-10-CM | POA: Diagnosis not present

## 2022-08-06 MED ORDER — SODIUM CHLORIDE 0.9 % IV SOLN
Freq: Once | INTRAVENOUS | Status: AC
  Filled 2022-08-06: qty 250

## 2022-08-06 MED ORDER — SODIUM CHLORIDE 0.9 % IV SOLN
200.0000 mg | Freq: Once | INTRAVENOUS | Status: AC
  Administered 2022-08-06: 200 mg via INTRAVENOUS
  Filled 2022-08-06: qty 200

## 2022-08-12 ENCOUNTER — Other Ambulatory Visit: Payer: Self-pay | Admitting: Gastroenterology

## 2022-08-12 DIAGNOSIS — I8510 Secondary esophageal varices without bleeding: Secondary | ICD-10-CM

## 2022-08-12 DIAGNOSIS — K7030 Alcoholic cirrhosis of liver without ascites: Secondary | ICD-10-CM

## 2022-08-12 DIAGNOSIS — K3189 Other diseases of stomach and duodenum: Secondary | ICD-10-CM

## 2022-08-19 ENCOUNTER — Encounter: Payer: Self-pay | Admitting: Oncology

## 2022-08-20 ENCOUNTER — Ambulatory Visit
Admission: RE | Admit: 2022-08-20 | Discharge: 2022-08-20 | Disposition: A | Discharge status: Home / Self Care | Payer: Medicare Other | Source: Ambulatory Visit | Attending: Gastroenterology | Admitting: Gastroenterology

## 2022-08-20 DIAGNOSIS — K3189 Other diseases of stomach and duodenum: Secondary | ICD-10-CM | POA: Insufficient documentation

## 2022-08-20 DIAGNOSIS — I8510 Secondary esophageal varices without bleeding: Secondary | ICD-10-CM | POA: Diagnosis present

## 2022-08-20 DIAGNOSIS — K766 Portal hypertension: Secondary | ICD-10-CM | POA: Diagnosis present

## 2022-08-20 DIAGNOSIS — K7030 Alcoholic cirrhosis of liver without ascites: Secondary | ICD-10-CM | POA: Insufficient documentation

## 2022-12-31 ENCOUNTER — Encounter: Payer: Self-pay | Admitting: Oncology

## 2023-01-07 ENCOUNTER — Inpatient Hospital Stay: Payer: Medicare Other | Attending: Oncology

## 2023-01-07 DIAGNOSIS — D509 Iron deficiency anemia, unspecified: Secondary | ICD-10-CM | POA: Insufficient documentation

## 2023-01-07 DIAGNOSIS — K7460 Unspecified cirrhosis of liver: Secondary | ICD-10-CM | POA: Insufficient documentation

## 2023-01-07 DIAGNOSIS — D508 Other iron deficiency anemias: Secondary | ICD-10-CM

## 2023-01-07 DIAGNOSIS — D696 Thrombocytopenia, unspecified: Secondary | ICD-10-CM | POA: Diagnosis not present

## 2023-01-07 DIAGNOSIS — Z87891 Personal history of nicotine dependence: Secondary | ICD-10-CM | POA: Diagnosis not present

## 2023-01-07 LAB — CBC WITH DIFFERENTIAL (CANCER CENTER ONLY)
Abs Immature Granulocytes: 0.05 10*3/uL (ref 0.00–0.07)
Basophils Absolute: 0 10*3/uL (ref 0.0–0.1)
Basophils Relative: 1 %
Eosinophils Absolute: 0.1 10*3/uL (ref 0.0–0.5)
Eosinophils Relative: 2 %
HCT: 31.4 % — ABNORMAL LOW (ref 36.0–46.0)
Hemoglobin: 9.7 g/dL — ABNORMAL LOW (ref 12.0–15.0)
Immature Granulocytes: 1 %
Lymphocytes Relative: 18 %
Lymphs Abs: 0.8 10*3/uL (ref 0.7–4.0)
MCH: 25.3 pg — ABNORMAL LOW (ref 26.0–34.0)
MCHC: 30.9 g/dL (ref 30.0–36.0)
MCV: 81.8 fL (ref 80.0–100.0)
Monocytes Absolute: 0.3 10*3/uL (ref 0.1–1.0)
Monocytes Relative: 7 %
Neutro Abs: 3.3 10*3/uL (ref 1.7–7.7)
Neutrophils Relative %: 71 %
Platelet Count: 94 10*3/uL — ABNORMAL LOW (ref 150–400)
RBC: 3.84 MIL/uL — ABNORMAL LOW (ref 3.87–5.11)
RDW: 15.9 % — ABNORMAL HIGH (ref 11.5–15.5)
WBC Count: 4.6 10*3/uL (ref 4.0–10.5)
nRBC: 0 % (ref 0.0–0.2)

## 2023-01-07 LAB — IRON AND TIBC
Iron: 48 ug/dL (ref 28–170)
Saturation Ratios: 12 % (ref 10.4–31.8)
TIBC: 392 ug/dL (ref 250–450)
UIBC: 344 ug/dL

## 2023-01-07 LAB — RETIC PANEL
Immature Retic Fract: 10.9 % (ref 2.3–15.9)
RBC.: 3.83 MIL/uL — ABNORMAL LOW (ref 3.87–5.11)
Retic Count, Absolute: 64 10*3/uL (ref 19.0–186.0)
Retic Ct Pct: 1.7 % (ref 0.4–3.1)
Reticulocyte Hemoglobin: 28.2 pg (ref >27.9)

## 2023-01-07 LAB — FERRITIN: Ferritin: 39 ng/mL (ref 11–307)

## 2023-01-08 LAB — AFP TUMOR MARKER: AFP, Serum, Tumor Marker: 2.5 ng/mL (ref 0.0–9.2)

## 2023-01-12 ENCOUNTER — Encounter: Payer: Self-pay | Admitting: Oncology

## 2023-01-12 ENCOUNTER — Inpatient Hospital Stay: Payer: Medicare Other

## 2023-01-12 ENCOUNTER — Inpatient Hospital Stay (HOSPITAL_BASED_OUTPATIENT_CLINIC_OR_DEPARTMENT_OTHER): Payer: Medicare Other | Admitting: Oncology

## 2023-01-12 VITALS — BP 128/69 | HR 79 | Temp 97.7°F | Resp 18 | Wt 176.2 lb

## 2023-01-12 VITALS — BP 159/72 | HR 93 | Temp 97.6°F | Resp 19

## 2023-01-12 DIAGNOSIS — D500 Iron deficiency anemia secondary to blood loss (chronic): Secondary | ICD-10-CM | POA: Diagnosis not present

## 2023-01-12 DIAGNOSIS — D508 Other iron deficiency anemias: Secondary | ICD-10-CM

## 2023-01-12 DIAGNOSIS — D696 Thrombocytopenia, unspecified: Secondary | ICD-10-CM | POA: Diagnosis not present

## 2023-01-12 DIAGNOSIS — K7469 Other cirrhosis of liver: Secondary | ICD-10-CM

## 2023-01-12 DIAGNOSIS — D509 Iron deficiency anemia, unspecified: Secondary | ICD-10-CM | POA: Diagnosis not present

## 2023-01-12 MED ORDER — SODIUM CHLORIDE 0.9 % IV SOLN
200.0000 mg | Freq: Once | INTRAVENOUS | Status: AC
  Administered 2023-01-12: 200 mg via INTRAVENOUS
  Filled 2023-01-12: qty 200

## 2023-01-12 MED ORDER — SODIUM CHLORIDE 0.9 % IV SOLN
INTRAVENOUS | Status: DC
  Filled 2023-01-12: qty 250

## 2023-01-12 NOTE — Assessment & Plan Note (Signed)
#   Cirrhosis/ portal hypertension gastropathy.  Recommend patient to  follow-up with gastroenterology

## 2023-01-12 NOTE — Assessment & Plan Note (Addendum)
#   Iron deficiency anemia.  Labs reviewed and discussed with patient Lab Results  Component Value Date   HGB 9.7 (L) 01/07/2023   TIBC 392 01/07/2023   IRONPCTSAT 12 01/07/2023   FERRITIN 39 01/07/2023     Persistent anemia, borderline iron saturation Recommend IV Venofer weekly x2 Check B12, Folate, SPEP

## 2023-01-12 NOTE — Progress Notes (Signed)
Hematology/Oncology progress note [phone]     CHIEF COMPLAINTS/REASON FOR VISIT:  iron deficiency anemia   ASSESSMENT & PLAN:   IDA (iron deficiency anemia) # Iron deficiency anemia.  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  Count has decreased.  Close monitor.  .  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  Recommend patient to  follow-up with gastroenterology    Orders Placed This Encounter  Procedures   CBC with Differential (Cancer Center Only)    Standing Status:   Future    Standing Expiration Date:   01/12/2024   Iron and TIBC    Standing Status:   Future    Standing Expiration Date:   01/12/2024   Ferritin    Standing Status:   Future    Standing Expiration Date:   01/12/2024   Retic Panel    Standing Status:   Future    Standing Expiration Date:   01/12/2024   Vitamin B12    Standing Status:   Future    Standing Expiration Date:   01/12/2024   Folate    Standing Status:   Future    Standing Expiration Date:   01/12/2024   CMP (Cancer Center only)    Standing Status:   Future    Standing Expiration Date:   01/12/2024   Lactate dehydrogenase    Standing Status:   Future    Standing Expiration Date:   01/12/2024   Protein electrophoresis, serum    Standing Status:   Future    Standing Expiration Date:   01/12/2024   Follow up in 3 months All questions were answered. The patient knows to call the clinic with any problems, questions or concerns.  Rickard Patience, MD, PhD [facility] Hematology Oncology 01/12/2023   HISTORY OF PRESENTING ILLNESS:  Brenda Beasley is a  72 y.o.   female with PMH listed below was seen in consultation at the request of Lynnea Ferrier, MD   for evaluation of iron deficiency anemia.   Reviewed patient's recent labs  6/21/202 Labs revealed anemia with hemoglobin of 9.1, MCV 76, normal total wbc and platlet Reviewed patient's previous labs ordered by primary care physician's office, anemia is chronic onset , duration is since 2019. Basline above 10 No aggravating or improving factors.  Associated signs and symptoms: Patient reports fatigue.  Mild SOB with exertion.  Denies weight loss, easy bruising, hematochezia, hemoptysis, hematuria. Context:  History of iron deficiency: long standing history of IDA. Takes oral iron supplementation Rectal bleeding: deneis.  Menstrual bleeding/ Vaginal bleeding : denies Hematemesis or hemoptysis : denies Blood in urine : denies   History partial colectomy about 15 years due to large polyp. Per patient, pathology showed pre-cancer polyp.  Family history is positive for colon cancer in mother and breast cancer in sister.  Accompanied by her son.  She craves for ice chips.   admission from 01/16/2021 - 01/17/2021 due to reported hematemesis which was later felt to be episode of epistaxis. Patient has heme positive stool 01/09/2021 upper EGD and colonoscopy.  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  Small hiatal hernia and grade 1 esophageal varices.  INTERVAL HISTORY Brenda Beasley is a 72 y.o. female who has above history reviewed by me today presents for follow up visit for  iron deficiency anemia.  Patient previously tolerates IV Venofer treatments. Chronic fatigue, unchanged.    Review of Systems  Constitutional:  Positive for fatigue. Negative for appetite change, chills, fever and unexpected weight change.  HENT:   Negative for hearing loss and voice change.   Eyes:  Negative for eye problems.   Respiratory:  Negative for chest tightness, cough and shortness of breath.   Cardiovascular:  Negative for chest pain.  Gastrointestinal:  Positive for blood in stool. Negative for abdominal distention and abdominal pain.  Endocrine: Negative for hot flashes.  Genitourinary:  Negative for difficulty urinating and frequency.   Musculoskeletal:  Positive for back pain. Negative for arthralgias.  Skin:  Negative for itching and rash.  Neurological:  Negative for extremity weakness.  Hematological:  Negative for adenopathy.  Psychiatric/Behavioral:  Negative for confusion.     MEDICAL HISTORY:  Past Medical History:  Diagnosis Date   Anxiety    Asthma    Chronic pruritus    Cirrhosis of liver (HCC)    COPD (chronic obstructive pulmonary disease) (HCC)    Diabetes mellitus without complication (HCC)    Family history of breast cancer    Family history of colon cancer    Family history of kidney cancer    Family history of lung cancer    GERD (gastroesophageal reflux disease)    Hamartomatous polyp of large intestine (HCC)    Hematuria, microscopic    History of uterine fibroid    History of ventral hernia    Hyperlipidemia    Hypertension    IBS (irritable bowel syndrome)    IDA (iron deficiency anemia) 11/13/2020   Osteoporosis with current pathological fracture 11/03/2018   Personal history of tobacco use, presenting hazards to health 09/20/2015   Proteinuria    Stroke (HCC)    Tobacco abuse 10/04/2014   Vitamin B 12 deficiency    Vitamin D deficiency     SURGICAL HISTORY: Past Surgical History:  Procedure Laterality Date   COLON SURGERY     COLONOSCOPY N/A 01/09/2021   Procedure: COLONOSCOPY;  Surgeon: Jaynie Collins, DO;  Location: [facility] ENDOSCOPY;  Service: Gastroenterology;  Laterality: N/A;   COLONOSCOPY WITH PROPOFOL N/A 11/23/2018   Procedure: COLONOSCOPY WITH PROPOFOL;  Surgeon: Toledo, Boykin Nearing, MD;  Location: [facility] ENDOSCOPY;  Service: Gastroenterology;   Laterality: N/A;   ESOPHAGOGASTRODUODENOSCOPY N/A 01/09/2021   Procedure: ESOPHAGOGASTRODUODENOSCOPY (EGD);  Surgeon: Jaynie Collins, DO;  Location: [facility] ENDOSCOPY;  Service: Gastroenterology;  Laterality: N/A;   ESOPHAGOGASTRODUODENOSCOPY N/A 07/02/2022   Procedure: ESOPHAGOGASTRODUODENOSCOPY (EGD);  Surgeon: Jaynie Collins, DO;  Location: [facility] ENDOSCOPY;  Service: Gastroenterology;  Laterality: N/A;   ESOPHAGOGASTRODUODENOSCOPY (EGD) WITH PROPOFOL N/A 11/23/2018   Procedure: ESOPHAGOGASTRODUODENOSCOPY (EGD) WITH PROPOFOL;  Surgeon: Toledo, Boykin Nearing, MD;  Location: [facility] ENDOSCOPY;  Service: Gastroenterology;  Laterality: N/A;   ESOPHAGOGASTRODUODENOSCOPY (EGD) WITH PROPOFOL N/A 01/17/2021   Procedure: ESOPHAGOGASTRODUODENOSCOPY (EGD) WITH PROPOFOL;  Surgeon: Toney Reil, MD;  Location: [facility] ENDOSCOPY;  Service: Gastroenterology;  Laterality: N/A;   HERNIA REPAIR     PARTIAL COLECTOMY     TONSILLECTOMY     VENTRAL HERNIA REPAIR      SOCIAL HISTORY: Social History   Socioeconomic History   Marital status: Widowed    Spouse  name: Not on file   Number of children: Not on file   Years of education: Not on file   Highest education level: Not on file  Occupational History   Not on file  Tobacco Use   Smoking status: Former    Current packs/day: 0.00    Average packs/day: 1 pack/day for 52.0 years (52.0 ttl pk-yrs)    Types: Cigarettes    Start date: 06/12/1966    Quit date: 06/12/2018    Years since quitting: 4.5   Smokeless tobacco: Never  Vaping Use   Vaping status: Never Used  Substance and Sexual Activity   Alcohol use: No    Alcohol/week: 0.0 standard drinks of alcohol   Drug use: Not Currently   Sexual activity: Not on file  Other Topics Concern   Not on file  Social History Narrative   Not on file   Social Determinants of Health   Financial Resource Strain: Low Risk  (07/22/2022)   Received from [facility], [facility]   Overall Financial Resource Strain (CARDIA)    Difficulty of Paying Living Expenses: Not hard at all  Food Insecurity: No Food Insecurity (07/22/2022)   Received from [facility], [facility]   Hunger Vital Sign    Worried About Running Out of Food in the Last Year: Never true    Ran Out of Food in the Last Year: Never true  Transportation Needs: No Transportation Needs (07/22/2022)   Received from [facility], [facility]   [facility] - Transportation    In the past 12 months, has lack of transportation kept you from medical appointments or from getting medications?: No    Lack of Transportation (Non-Medical): No  Physical Activity: Not on file  Stress: Not on file  Social Connections: Not on file  Intimate Partner Violence: Not on file    FAMILY HISTORY: Family History  Problem Relation Age of Onset   Diabetes Mother    Diabetes Father    Diabetes Sister    Diabetes Maternal Grandmother    Diabetes Paternal Grandmother     ALLERGIES:  is allergic to molds & smuts, other, penicillins, pioglitazone, byetta 10 mcg pen [exenatide], dapagliflozin, jardiance [empagliflozin], victoza [liraglutide], sulfa antibiotics, and sulfasalazine.  MEDICATIONS:  Current Outpatient Medications  Medication Sig Dispense Refill   amLODipine (NORVASC) 2.5 MG tablet Take 2.5 mg by mouth daily.     Cholecalciferol (VITAMIN D3) 5000 units TABS Take 5,000 Units by mouth daily.      clotrimazole (MYCELEX) 10 MG troche Take 10 mg by mouth 3 (three) times daily.     cyanocobalamin (,VITAMIN B-12,) 1000 MCG/ML injection Inject 1,000 mcg into the muscle every 30 (thirty) days.     fluticasone (FLONASE) 50 MCG/ACT nasal spray Place 2 sprays into the nose daily.     gabapentin (NEURONTIN) 100 MG capsule Take 100 mg by mouth at bedtime as needed.     glipiZIDE (GLUCOTROL) 10 MG tablet Take 20 mg by mouth 2 (two) times daily.      guaiFENesin (MUCINEX) 600 MG 12 hr tablet Take 600 mg by mouth 2 (two) times daily as needed.     hydrochlorothiazide (HYDRODIURIL) 25 MG tablet Hold until followup with outpatient doctor due to intermittent low blood pressure.     ipratropium-albuterol (DUONEB) 0.5-2.5 (3) MG/3ML SOLN Take 3 mLs by nebulization 4 (four) times daily as needed.     levofloxacin (LEVAQUIN) 500  MG tablet Take 500 mg by mouth daily.     lisinopril (ZESTRIL) 40 MG tablet Hold until followup with outpatient doctor due to intermittent low blood pressure.     lovastatin (MEVACOR) 40 MG tablet Take 2 tablets by mouth daily.     MAGNESIUM PO Take 1 tablet by mouth at bedtime.     metFORMIN (GLUCOPHAGE) 1000 MG tablet Take 1,000 mg by mouth 2 (two) times daily.     omeprazole (PRILOSEC) 20 MG capsule Take 20 mg by mouth 2 (two) times daily.     predniSONE (DELTASONE) 10 MG tablet Take 4 tabs daily for 2 days, then 3 tabs daily for 2 days, then 2 tabs daily for 2 days, then 1 tab daily for 2 days     PULMICORT FLEXHALER 180 MCG/ACT inhaler Inhale 1 puff into the lungs 2 (two) times daily.     TRESIBA FLEXTOUCH 200 UNIT/ML FlexTouch Pen Inject 70 Units into the skin daily.     vitamin C (ASCORBIC ACID) 250 MG tablet Take 250 mg by mouth daily.     Insulin Glargine, 2 Unit Dial, (TOUJEO MAX SOLOSTAR) 300 UNIT/ML SOPN Inject 300 Units/mL into the skin at bedtime. (Patient not taking: Reported on 01/12/2023)     No current facility-administered medications for this visit.     PHYSICAL EXAMINATION: ECOG PERFORMANCE STATUS: 1 - Symptomatic but completely ambulatory Vitals:   01/12/23 1410  BP: 128/69  Pulse: 79  Resp: 18  Temp: 97.7 F (36.5 C)   Filed Weights   01/12/23 1410  Weight: 176 lb 3.2 oz (79.9 kg)    Physical Exam Constitutional:      General: She is not in acute distress.    Appearance: She is obese.  HENT:     Head: Normocephalic and atraumatic.  Eyes:     General: No scleral  icterus. Cardiovascular:     Rate and Rhythm: Normal rate.  Pulmonary:     Effort: Pulmonary effort is normal. No respiratory distress.  Abdominal:     General: Bowel sounds are normal.     Palpations: Abdomen is soft.  Musculoskeletal:        General: No deformity. Normal range of motion.     Cervical back: Normal range of motion and neck supple.  Skin:    General: Skin is warm and dry.     Findings: No rash.  Neurological:     Mental Status: She is alert and oriented to person, place, and time. Mental status is at baseline.  Psychiatric:        Mood and Affect: Mood normal.      Laboratory studies I have reviewed the data as listed    Latest Ref Rng & Units 01/07/2023   12:33 PM  CBC  WBC 4.0 - 10.5 K/uL 4.6   Hemoglobin 12.0 - 15.0 g/dL 9.7   Hematocrit 57.3 - 46.0 % 31.4   Platelets 150 - 400 K/uL 94        Latest Ref Rng & Units 07/06/2022   11:03 AM 01/16/2021    1:23 PM 06/23/2018   12:02 AM  CMP  Glucose 70 - 99 mg/dL 220  90  254   BUN 8 - 23 mg/dL 19  15  20    Creatinine 0.44 - 1.00 mg/dL 2.70  6.23  7.62   Sodium 135 - 145 mmol/L 134  139  136   Potassium 3.5 - 5.1 mmol/L 3.7  3.6  4.2   Chloride 98 -  111 mmol/L 97  102  98   CO2 22 - 32 mmol/L 26  26  27    Calcium 8.9 - 10.3 mg/dL 8.8  9.4  9.3   Total Protein 6.5 - 8.1 g/dL 6.9  7.7  7.1   Total Bilirubin 0.3 - 1.2 mg/dL 0.6  0.6  0.8   Alkaline Phos 38 - 126 U/L 53  66  61   AST 15 - 41 U/L 21  26  30    ALT 0 - 44 U/L 17  16  23      Iron/TIBC/Ferritin/ %Sat    Component Value Date/Time   IRON 48 01/07/2023 1233   TIBC 392 01/07/2023 1233   FERRITIN 39 01/07/2023 1233   IRONPCTSAT 12 01/07/2023 1233     RADIOGRAPHIC STUDIES: I have personally reviewed the radiological images as listed and agreed with the findings in the report. No results found.

## 2023-01-12 NOTE — Assessment & Plan Note (Addendum)
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  Count has decreased.  Close monitor.  Marland Kitchen

## 2023-01-29 ENCOUNTER — Inpatient Hospital Stay: Payer: Medicare Other | Attending: Oncology

## 2023-01-29 VITALS — BP 145/94 | HR 82 | Temp 98.5°F | Resp 18

## 2023-01-29 DIAGNOSIS — K7469 Other cirrhosis of liver: Secondary | ICD-10-CM | POA: Insufficient documentation

## 2023-01-29 DIAGNOSIS — D509 Iron deficiency anemia, unspecified: Secondary | ICD-10-CM | POA: Insufficient documentation

## 2023-01-29 DIAGNOSIS — D696 Thrombocytopenia, unspecified: Secondary | ICD-10-CM | POA: Insufficient documentation

## 2023-01-29 DIAGNOSIS — D508 Other iron deficiency anemias: Secondary | ICD-10-CM

## 2023-01-29 MED ORDER — SODIUM CHLORIDE 0.9 % IV SOLN
200.0000 mg | Freq: Once | INTRAVENOUS | Status: AC
  Administered 2023-01-29: 200 mg via INTRAVENOUS
  Filled 2023-01-29: qty 200

## 2023-01-29 MED ORDER — SODIUM CHLORIDE 0.9 % IV SOLN
INTRAVENOUS | Status: DC
  Filled 2023-01-29: qty 250

## 2023-01-29 NOTE — Progress Notes (Signed)
Pt has been educated and understands. Pt declined to stay 30 mins after iron infusion.

## 2023-04-12 ENCOUNTER — Encounter: Payer: Self-pay | Admitting: Oncology

## 2023-04-12 ENCOUNTER — Inpatient Hospital Stay: Payer: Medicare Other | Attending: Oncology

## 2023-04-12 ENCOUNTER — Inpatient Hospital Stay: Payer: Medicare Other

## 2023-04-12 ENCOUNTER — Inpatient Hospital Stay (HOSPITAL_BASED_OUTPATIENT_CLINIC_OR_DEPARTMENT_OTHER): Payer: Medicare Other | Admitting: Oncology

## 2023-04-12 VITALS — BP 166/92 | HR 80 | Temp 97.1°F | Resp 18 | Wt 174.5 lb

## 2023-04-12 DIAGNOSIS — Z803 Family history of malignant neoplasm of breast: Secondary | ICD-10-CM | POA: Insufficient documentation

## 2023-04-12 DIAGNOSIS — Z800 Family history of malignant neoplasm of digestive organs: Secondary | ICD-10-CM | POA: Insufficient documentation

## 2023-04-12 DIAGNOSIS — D696 Thrombocytopenia, unspecified: Secondary | ICD-10-CM

## 2023-04-12 DIAGNOSIS — D500 Iron deficiency anemia secondary to blood loss (chronic): Secondary | ICD-10-CM | POA: Diagnosis not present

## 2023-04-12 DIAGNOSIS — D509 Iron deficiency anemia, unspecified: Secondary | ICD-10-CM | POA: Insufficient documentation

## 2023-04-12 DIAGNOSIS — K7469 Other cirrhosis of liver: Secondary | ICD-10-CM

## 2023-04-12 DIAGNOSIS — K7460 Unspecified cirrhosis of liver: Secondary | ICD-10-CM | POA: Insufficient documentation

## 2023-04-12 DIAGNOSIS — Z87891 Personal history of nicotine dependence: Secondary | ICD-10-CM | POA: Diagnosis not present

## 2023-04-12 LAB — CMP (CANCER CENTER ONLY)
ALT: 13 U/L (ref 0–44)
AST: 20 U/L (ref 15–41)
Albumin: 3.9 g/dL (ref 3.5–5.0)
Alkaline Phosphatase: 58 U/L (ref 38–126)
Anion gap: 16 — ABNORMAL HIGH (ref 5–15)
BUN: 19 mg/dL (ref 8–23)
CO2: 24 mmol/L (ref 22–32)
Calcium: 8.8 mg/dL — ABNORMAL LOW (ref 8.9–10.3)
Chloride: 99 mmol/L (ref 98–111)
Creatinine: 0.9 mg/dL (ref 0.44–1.00)
GFR, Estimated: >60 mL/min (ref >60)
Glucose, Bld: 146 mg/dL — ABNORMAL HIGH (ref 70–99)
Potassium: 3.8 mmol/L (ref 3.5–5.1)
Sodium: 139 mmol/L (ref 135–145)
Total Bilirubin: 0.4 mg/dL (ref <1.2)
Total Protein: 7 g/dL (ref 6.5–8.1)

## 2023-04-12 LAB — CBC WITH DIFFERENTIAL (CANCER CENTER ONLY)
Abs Immature Granulocytes: 0.02 10*3/uL (ref 0.00–0.07)
Basophils Absolute: 0.1 10*3/uL (ref 0.0–0.1)
Basophils Relative: 1 %
Eosinophils Absolute: 0.1 10*3/uL (ref 0.0–0.5)
Eosinophils Relative: 2 %
HCT: 33.7 % — ABNORMAL LOW (ref 36.0–46.0)
Hemoglobin: 10.6 g/dL — ABNORMAL LOW (ref 12.0–15.0)
Immature Granulocytes: 0 %
Lymphocytes Relative: 18 %
Lymphs Abs: 1 10*3/uL (ref 0.7–4.0)
MCH: 25.9 pg — ABNORMAL LOW (ref 26.0–34.0)
MCHC: 31.5 g/dL (ref 30.0–36.0)
MCV: 82.2 fL (ref 80.0–100.0)
Monocytes Absolute: 0.5 10*3/uL (ref 0.1–1.0)
Monocytes Relative: 8 %
Neutro Abs: 4.1 10*3/uL (ref 1.7–7.7)
Neutrophils Relative %: 71 %
Platelet Count: 123 10*3/uL — ABNORMAL LOW (ref 150–400)
RBC: 4.1 MIL/uL (ref 3.87–5.11)
RDW: 15.7 % — ABNORMAL HIGH (ref 11.5–15.5)
WBC Count: 5.8 10*3/uL (ref 4.0–10.5)
nRBC: 0 % (ref 0.0–0.2)

## 2023-04-12 LAB — RETIC PANEL
Immature Retic Fract: 13.6 % (ref 2.3–15.9)
RBC.: 4.09 MIL/uL (ref 3.87–5.11)
Retic Count, Absolute: 69.9 10*3/uL (ref 19.0–186.0)
Retic Ct Pct: 1.7 % (ref 0.4–3.1)
Reticulocyte Hemoglobin: 27 pg — ABNORMAL LOW (ref >27.9)

## 2023-04-12 LAB — IRON AND TIBC
Iron: 42 ug/dL (ref 28–170)
Saturation Ratios: 9 % — ABNORMAL LOW (ref 10.4–31.8)
TIBC: 465 ug/dL — ABNORMAL HIGH (ref 250–450)
UIBC: 423 ug/dL

## 2023-04-12 LAB — LACTATE DEHYDROGENASE: LDH: 99 U/L (ref 98–192)

## 2023-04-12 LAB — FOLATE: Folate: 9.3 ng/mL (ref >5.9)

## 2023-04-12 LAB — FERRITIN: Ferritin: 25 ng/mL (ref 11–307)

## 2023-04-12 NOTE — Assessment & Plan Note (Signed)
#   Cirrhosis/ portal hypertension gastropathy.  Recommend patient to  follow-up with gastroenterology

## 2023-04-12 NOTE — Assessment & Plan Note (Addendum)
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

## 2023-04-12 NOTE — Progress Notes (Signed)
Hematology/Oncology progress note [phone]     CHIEF COMPLAINTS/REASON FOR VISIT:  iron deficiency anemia   ASSESSMENT & PLAN:   IDA (iron deficiency anemia) # Iron deficiency anemia.  Labs reviewed and discussed with patient Lab Results  Component Value Date   HGB 10.6 (L) 04/12/2023   TIBC 465 (H) 04/12/2023   IRONPCTSAT 9 (L) 04/12/2023   FERRITIN 25 04/12/2023     Persistent anemia, decreased iron saturation, consistent with iron deficiency anemia. 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   Orders Placed This Encounter  Procedures   CBC with Differential (Cancer Center Only)    Standing Status:   Future    Standing Expiration Date:   04/11/2024   Iron and TIBC    Standing Status:   Future    Standing Expiration Date:   04/11/2024   Ferritin    Standing Status:   Future    Standing Expiration Date:   04/11/2024   Retic Panel    Standing Status:   Future    Standing Expiration Date:   04/11/2024   Follow up in 6 months All questions were answered. The patient knows to call the clinic with any problems, questions or concerns.  Rickard Patience, MD, PhD [facility] Hematology Oncology 04/12/2023   HISTORY OF PRESENTING ILLNESS:  Brenda Beasley is a  72 y.o.  female with PMH listed below was seen in consultation at the request of Lynnea Ferrier, MD   for evaluation of iron deficiency anemia.   Reviewed patient's recent labs  6/21/202 Labs revealed anemia with hemoglobin of 9.1, MCV 76, normal total wbc and platlet Reviewed patient's previous labs ordered by primary care physician's office, anemia is chronic onset , duration is since 2019. Basline above 10 No aggravating or improving factors.  Associated signs and  symptoms: Patient reports fatigue.  Mild SOB with exertion.  Denies weight loss, easy bruising, hematochezia, hemoptysis, hematuria. Context:  History of iron deficiency: long standing history of IDA. Takes oral iron supplementation Rectal bleeding: deneis.  Menstrual bleeding/ Vaginal bleeding : denies Hematemesis or hemoptysis : denies Blood in urine : denies   History partial colectomy about 15 years due to large polyp. Per patient, pathology showed pre-cancer polyp.  Family history is positive for colon cancer in mother and breast cancer in sister.  Accompanied by her son.  She craves for ice chips.   admission from 01/16/2021 - 01/17/2021 due to reported hematemesis which was later felt to be episode of epistaxis. Patient has heme positive stool 01/09/2021 upper EGD and colonoscopy.  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  Small hiatal hernia and grade 1 esophageal varices.  INTERVAL HISTORY Brenda Beasley is a 72 y.o. female who has above history reviewed by me today presents for follow up visit for  iron deficiency anemia.  Patient previously tolerates IV Venofer treatments. Chronic fatigue, unchanged.    Review of Systems  Constitutional:  Positive for fatigue. Negative for appetite change, chills, fever and unexpected weight change.  HENT:   Negative for hearing loss and voice change.   Eyes:  Negative for eye problems.  Respiratory:  Negative for chest tightness, cough and shortness of breath.   Cardiovascular:  Negative for chest pain.  Gastrointestinal:  Positive for blood in stool. Negative for abdominal distention and abdominal pain.  Endocrine: Negative for hot flashes.  Genitourinary:  Negative for difficulty urinating and frequency.   Musculoskeletal:  Positive for back pain. Negative for arthralgias.  Skin:  Negative for itching and rash.  Neurological:  Negative for  extremity weakness.  Hematological:  Negative for adenopathy.  Psychiatric/Behavioral:  Negative for confusion.     MEDICAL HISTORY:  Past Medical History:  Diagnosis Date   Anxiety    Asthma    Chronic pruritus    Cirrhosis of liver (HCC)    COPD (chronic obstructive pulmonary disease) (HCC)    Diabetes mellitus without complication (HCC)    Family history of breast cancer    Family history of colon cancer    Family history of kidney cancer    Family history of lung cancer    GERD (gastroesophageal reflux disease)    Hamartomatous polyp of large intestine (HCC)    Hematuria, microscopic    History of uterine fibroid    History of ventral hernia    Hyperlipidemia    Hypertension    IBS (irritable bowel syndrome)    IDA (iron deficiency anemia) 11/13/2020   Osteoporosis with current pathological fracture 11/03/2018   Personal history of tobacco use, presenting hazards to health 09/20/2015   Proteinuria    Stroke (HCC)    Tobacco abuse 10/04/2014   Vitamin B 12 deficiency    Vitamin D deficiency     SURGICAL HISTORY: Past Surgical History:  Procedure Laterality Date   COLON SURGERY     COLONOSCOPY N/A 01/09/2021   Procedure: COLONOSCOPY;  Surgeon: Jaynie Collins, DO;  Location: [facility] ENDOSCOPY;  Service: Gastroenterology;  Laterality: N/A;   COLONOSCOPY WITH PROPOFOL N/A 11/23/2018   Procedure: COLONOSCOPY WITH PROPOFOL;  Surgeon: Toledo, Boykin Nearing, MD;  Location: [facility] ENDOSCOPY;  Service: Gastroenterology;  Laterality: N/A;   ESOPHAGOGASTRODUODENOSCOPY N/A 01/09/2021   Procedure: ESOPHAGOGASTRODUODENOSCOPY (EGD);  Surgeon: Jaynie Collins, DO;  Location[facility] ENDOSCOPY;  Service: Gastroenterology;  Laterality: N/A;   ESOPHAGOGASTRODUODENOSCOPY N/A 07/02/2022   Procedure: ESOPHAGOGASTRODUODENOSCOPY (EGD);  Surgeon: Jaynie Collins, DO;  Location: [facility] ENDOSCOPY;  Service: Gastroenterology;  Laterality: N/A;   ESOPHAGOGASTRODUODENOSCOPY (EGD) WITH PROPOFOL  N/A 11/23/2018   Procedure: ESOPHAGOGASTRODUODENOSCOPY (EGD) WITH PROPOFOL;  Surgeon: Toledo, Boykin Nearing, MD;  Location: [facility] ENDOSCOPY;  Service: Gastroenterology;  Laterality: N/A;   ESOPHAGOGASTRODUODENOSCOPY (EGD) WITH PROPOFOL N/A 01/17/2021   Procedure: ESOPHAGOGASTRODUODENOSCOPY (EGD) WITH PROPOFOL;  Surgeon: Toney Reil, MD;  Location: [facility] ENDOSCOPY;  Service: Gastroenterology;  Laterality: N/A;   HERNIA REPAIR     PARTIAL COLECTOMY     TONSILLECTOMY     VENTRAL HERNIA REPAIR      SOCIAL HISTORY: Social History   Socioeconomic History   Marital status: Widowed    Spouse name: Not on file   Number of children: Not on file   Years of education: Not on file   Highest education level: Not on file  Occupational History   Not on file  Tobacco Use   Smoking status: Former    Current packs/day: 0.00    Average packs/day: 1 pack/day for 52.0 years (52.0 ttl pk-yrs)    Types: Cigarettes    Start date: 06/12/1966    Quit date: 06/12/2018    Years since quitting: 4.8   Smokeless tobacco: Never  Vaping Use   Vaping status: Never Used  Substance and Sexual Activity   Alcohol use:  No    Alcohol/week: 0.0 standard drinks of alcohol   Drug use: Not Currently   Sexual activity: Not on file  Other Topics Concern   Not on file  Social History Narrative   Not on file   Social Determinants of Health   Financial Resource Strain: Low Risk  (07/22/2022)   Received from [facility], [facility]   Overall Financial Resource Strain (CARDIA)    Difficulty of Paying Living Expenses: Not hard at all  Food Insecurity: No Food Insecurity (07/22/2022)   Received from [facility], [facility]   Hunger Vital Sign    Worried About Running Out of Food in the Last Year: Never true    Ran Out of Food in the Last Year: Never true  Transportation Needs: No Transportation Needs (07/22/2022)   Received from [facility], [facility]   [facility] - Transportation    In the past 12 months, has lack of transportation kept you from medical appointments or from getting medications?: No    Lack of Transportation (Non-Medical): No  Physical Activity: Not on file  Stress: Not on file  Social Connections: Not on file  Intimate Partner Violence: Not on file    FAMILY HISTORY: Family History  Problem Relation Age of Onset   Diabetes Mother    Diabetes Father    Diabetes Sister    Diabetes Maternal Grandmother    Diabetes Paternal Grandmother     ALLERGIES:  is allergic to molds & smuts, other, penicillins, pioglitazone, byetta 10 mcg pen [exenatide], dapagliflozin, jardiance [empagliflozin], victoza [liraglutide], sulfa antibiotics, and sulfasalazine.  MEDICATIONS:  Current Outpatient Medications  Medication Sig Dispense Refill   albuterol (VENTOLIN HFA) 108 (90 Base) MCG/ACT inhaler SMARTSIG:2 Puff(s) By Mouth Every 6 Hours PRN     amLODipine (NORVASC) 2.5 MG tablet Take 2.5 mg by mouth daily.     BD PEN NEEDLE NANO 2ND GEN 32G X 4 MM MISC USE 1 EACH ONCE DAILY     Cholecalciferol (VITAMIN D3) 5000 units TABS Take 5,000 Units by mouth daily.      cyanocobalamin (,VITAMIN B-12,) 1000 MCG/ML injection Inject 1,000 mcg into the muscle every 30 (thirty) days.     fluticasone (FLONASE) 50 MCG/ACT nasal spray Place 2 sprays into the nose daily.     gabapentin (NEURONTIN) 100 MG capsule Take 100 mg by mouth at bedtime as needed.     glipiZIDE (GLUCOTROL) 10 MG tablet Take 20 mg by mouth 2 (two) times daily.     guaiFENesin (MUCINEX) 600 MG 12 hr tablet Take 600 mg by mouth 2 (two) times daily as needed.     hydrochlorothiazide (HYDRODIURIL) 25 MG tablet Hold until followup with outpatient doctor due to intermittent low blood pressure.     ipratropium-albuterol (DUONEB) 0.5-2.5 (3) MG/3ML SOLN Take 3 mLs by nebulization 4 (four) times daily as needed.     lisinopril (ZESTRIL) 40 MG tablet  Hold until followup with outpatient doctor due to intermittent low blood pressure.     lovastatin (MEVACOR) 40 MG tablet Take 2 tablets by mouth daily.     MAGNESIUM PO Take 1 tablet by mouth at bedtime.     metFORMIN (GLUCOPHAGE) 1000 MG tablet Take 1,000 mg by mouth 2 (two) times daily.     omeprazole (PRILOSEC) 20 MG capsule Take 20 mg by mouth 2 (two) times daily.     predniSONE (DELTASONE) 10 MG tablet Take  4 tabs daily for 2 days, then 3 tabs daily for 2 days, then 2 tabs daily for 2 days, then 1 tab daily for 2 days     PULMICORT FLEXHALER 180 MCG/ACT inhaler Inhale 1 puff into the lungs 2 (two) times daily.     torsemide (DEMADEX) 20 MG tablet Take by mouth.     TRESIBA FLEXTOUCH 200 UNIT/ML FlexTouch Pen Inject 70 Units into the skin daily.     vitamin C (ASCORBIC ACID) 250 MG tablet Take 250 mg by mouth daily.     Insulin Glargine, 2 Unit Dial, (TOUJEO MAX SOLOSTAR) 300 UNIT/ML SOPN Inject 300 Units/mL into the skin at bedtime. (Patient not taking: Reported on 01/12/2023)     No current facility-administered medications for this visit.     PHYSICAL EXAMINATION: ECOG PERFORMANCE STATUS: 1 - Symptomatic but completely ambulatory Vitals:   04/12/23 1329 04/12/23 1337  BP: (!) 166/76 (!) 166/92  Pulse: 80   Resp: 18   Temp: (!) 97.1 F (36.2 C)   SpO2: 99%    Filed Weights   04/12/23 1329  Weight: 174 lb 8 oz (79.2 kg)    Physical Exam Constitutional:      General: She is not in acute distress.    Appearance: She is obese.  HENT:     Head: Normocephalic and atraumatic.  Eyes:     General: No scleral icterus. Cardiovascular:     Rate and Rhythm: Normal rate.  Pulmonary:     Effort: Pulmonary effort is normal. No respiratory distress.  Abdominal:     General: Bowel sounds are normal.     Palpations: Abdomen is soft.  Musculoskeletal:        General: No deformity. Normal range of motion.     Cervical back: Normal range of motion and neck supple.  Skin:    General:  Skin is warm and dry.     Findings: No rash.  Neurological:     Mental Status: She is alert and oriented to person, place, and time. Mental status is at baseline.  Psychiatric:        Mood and Affect: Mood normal.      Laboratory studies I have reviewed the data as listed    Latest Ref Rng & Units 04/12/2023    1:04 PM  CBC  WBC 4.0 - 10.5 K/uL 5.8   Hemoglobin 12.0 - 15.0 g/dL 09.8   Hematocrit 11.9 - 46.0 % 33.7   Platelets 150 - 400 K/uL 123        Latest Ref Rng & Units 04/12/2023    1:04 PM 07/06/2022   11:03 AM 01/16/2021    1:23 PM  CMP  Glucose 70 - 99 mg/dL 147  829  90   BUN 8 - 23 mg/dL 19  19  15    Creatinine 0.44 - 1.00 mg/dL 5.62  1.30  8.65   Sodium 135 - 145 mmol/L 139  134  139   Potassium 3.5 - 5.1 mmol/L 3.8  3.7  3.6   Chloride 98 - 111 mmol/L 99  97  102   CO2 22 - 32 mmol/L 24  26  26    Calcium 8.9 - 10.3 mg/dL 8.8  8.8  9.4   Total Protein 6.5 - 8.1 g/dL 7.0  6.9  7.7   Total Bilirubin <1.2 mg/dL 0.4  0.6  0.6   Alkaline Phos 38 - 126 U/L 58  53  66   AST 15 - 41 U/L  20  21  26    ALT 0 - 44 U/L 13  17  16      Iron/TIBC/Ferritin/ %Sat    Component Value Date/Time   IRON 42 04/12/2023 1304   TIBC 465 (H) 04/12/2023 1304   FERRITIN 25 04/12/2023 1304   IRONPCTSAT 9 (L) 04/12/2023 1304     RADIOGRAPHIC STUDIES: I have personally reviewed the radiological images as listed and agreed with the findings in the report. No results found.

## 2023-04-12 NOTE — Assessment & Plan Note (Addendum)
#   Iron deficiency anemia.  Labs reviewed and discussed with patient Lab Results  Component Value Date   HGB 10.6 (L) 04/12/2023   TIBC 465 (H) 04/12/2023   IRONPCTSAT 9 (L) 04/12/2023   FERRITIN 25 04/12/2023     Persistent anemia, decreased iron saturation, consistent with iron deficiency anemia. Recommend IV Venofer weekly x 4

## 2023-04-13 ENCOUNTER — Telehealth: Payer: Self-pay

## 2023-04-13 ENCOUNTER — Other Ambulatory Visit: Payer: Medicare Other

## 2023-04-13 LAB — VITAMIN B12: Vitamin B-12: 850 pg/mL (ref 180–914)

## 2023-04-13 NOTE — Telephone Encounter (Signed)
Called and left message with patient that iron levels are still low and that Dr. Cathie Hoops recommends IV Venofer weekly x 4. I will have Morrie Sheldon schedule patient and notify her of dates and times.

## 2023-04-13 NOTE — Telephone Encounter (Signed)
-----   Message from Rickard Patience sent at 04/12/2023  9:14 PM EST ----- Please let patient know that her iron level is still low.  Recommend IV venofer weekly x 4

## 2023-04-14 ENCOUNTER — Ambulatory Visit: Payer: Medicare Other | Admitting: Oncology

## 2023-04-14 ENCOUNTER — Ambulatory Visit: Payer: Medicare Other

## 2023-04-18 LAB — PROTEIN ELECTROPHORESIS, SERUM
A/G Ratio: 1.3 (ref 0.7–1.7)
Albumin ELP: 3.4 g/dL (ref 2.9–4.4)
Alpha-1-Globulin: 0.2 g/dL (ref 0.0–0.4)
Alpha-2-Globulin: 0.7 g/dL (ref 0.4–1.0)
Beta Globulin: 1 g/dL (ref 0.7–1.3)
Gamma Globulin: 0.7 g/dL (ref 0.4–1.8)
Globulin, Total: 2.6 g/dL (ref 2.2–3.9)
Total Protein ELP: 6 g/dL (ref 6.0–8.5)

## 2023-05-25 ENCOUNTER — Inpatient Hospital Stay: Payer: Medicare Other | Attending: Oncology

## 2023-05-25 VITALS — BP 179/83 | HR 78 | Temp 98.4°F | Resp 18

## 2023-05-25 DIAGNOSIS — D508 Other iron deficiency anemias: Secondary | ICD-10-CM

## 2023-05-25 DIAGNOSIS — D509 Iron deficiency anemia, unspecified: Secondary | ICD-10-CM | POA: Insufficient documentation

## 2023-05-25 MED ORDER — SODIUM CHLORIDE 0.9% FLUSH
10.0000 mL | Freq: Once | INTRAVENOUS | Status: AC | PRN
  Administered 2023-05-25: 10 mL
  Filled 2023-05-25: qty 10

## 2023-05-25 MED ORDER — IRON SUCROSE 20 MG/ML IV SOLN
200.0000 mg | Freq: Once | INTRAVENOUS | Status: AC
Start: 2023-05-25 — End: 2023-05-25
  Administered 2023-05-25: 200 mg via INTRAVENOUS
  Filled 2023-05-25: qty 10

## 2023-06-01 ENCOUNTER — Inpatient Hospital Stay: Payer: Medicare Other

## 2023-06-01 VITALS — BP 153/95 | HR 85 | Temp 96.0°F | Resp 18

## 2023-06-01 DIAGNOSIS — D508 Other iron deficiency anemias: Secondary | ICD-10-CM

## 2023-06-01 DIAGNOSIS — D509 Iron deficiency anemia, unspecified: Secondary | ICD-10-CM | POA: Diagnosis not present

## 2023-06-01 MED ORDER — IRON SUCROSE 20 MG/ML IV SOLN
200.0000 mg | Freq: Once | INTRAVENOUS | Status: AC
  Administered 2023-06-01: 200 mg via INTRAVENOUS
  Filled 2023-06-01: qty 10

## 2023-06-08 ENCOUNTER — Inpatient Hospital Stay: Payer: Medicare Other

## 2023-06-08 VITALS — BP 173/74 | HR 90 | Resp 18

## 2023-06-08 DIAGNOSIS — D508 Other iron deficiency anemias: Secondary | ICD-10-CM

## 2023-06-08 DIAGNOSIS — D509 Iron deficiency anemia, unspecified: Secondary | ICD-10-CM | POA: Diagnosis not present

## 2023-06-08 MED ORDER — IRON SUCROSE 20 MG/ML IV SOLN
200.0000 mg | Freq: Once | INTRAVENOUS | Status: AC
  Administered 2023-06-08: 200 mg via INTRAVENOUS
  Filled 2023-06-08: qty 10

## 2023-06-08 NOTE — Patient Instructions (Signed)
Iron Sucrose Injection What is this medication? IRON SUCROSE (EYE ern SOO krose) treats low levels of iron (iron deficiency anemia) in people with kidney disease. Iron is a mineral that plays an important role in making red blood cells, which carry oxygen from your lungs to the rest of your body. This medicine may be used for other purposes; ask your health care provider or pharmacist if you have questions. COMMON BRAND NAME(S): Venofer What should I tell my care team before I take this medication? They need to know if you have any of these conditions: Anemia not caused by low iron levels Heart disease High levels of iron in the blood Kidney disease Liver disease An unusual or allergic reaction to iron, other medications, foods, dyes, or preservatives Pregnant or trying to get pregnant Breastfeeding How should I use this medication? This medication is for infusion into a vein. It is given in a hospital or clinic setting. Talk to your care team about the use of this medication in children. While this medication may be prescribed for children as young as 2 years for selected conditions, precautions do apply. Overdosage: If you think you have taken too much of this medicine contact a poison control center or emergency room at once. NOTE: This medicine is only for you. Do not share this medicine with others. What if I miss a dose? Keep appointments for follow-up doses. It is important not to miss your dose. Call your care team if you are unable to keep an appointment. What may interact with this medication? Do not take this medication with any of the following: Deferoxamine Dimercaprol Other iron products This medication may also interact with the following: Chloramphenicol Deferasirox This list may not describe all possible interactions. Give your health care provider a list of all the medicines, herbs, non-prescription drugs, or dietary supplements you use. Also tell them if you smoke,  drink alcohol, or use illegal drugs. Some items may interact with your medicine. What should I watch for while using this medication? Visit your care team regularly. Tell your care team if your symptoms do not start to get better or if they get worse. You may need blood work done while you are taking this medication. You may need to follow a special diet. Talk to your care team. Foods that contain iron include: whole grains/cereals, dried fruits, beans, or peas, leafy green vegetables, and organ meats (liver, kidney). What side effects may I notice from receiving this medication? Side effects that you should report to your care team as soon as possible: Allergic reactions--skin rash, itching, hives, swelling of the face, lips, tongue, or throat Low blood pressure--dizziness, feeling faint or lightheaded, blurry vision Shortness of breath Side effects that usually do not require medical attention (report to your care team if they continue or are bothersome): Flushing Headache Joint pain Muscle pain Nausea Pain, redness, or irritation at injection site This list may not describe all possible side effects. Call your doctor for medical advice about side effects. You may report side effects to FDA at 1-800-FDA-1088. Where should I keep my medication? This medication is given in a hospital or clinic and will not be stored at home. NOTE: This sheet is a summary. It may not cover all possible information. If you have questions about this medicine, talk to your doctor, pharmacist, or health care provider.  2023 Elsevier/Gold Standard (2020-08-15 00:00:00)  

## 2023-06-15 ENCOUNTER — Inpatient Hospital Stay: Payer: Medicare Other

## 2023-06-15 ENCOUNTER — Other Ambulatory Visit: Payer: Self-pay | Admitting: Pulmonary Disease

## 2023-06-15 VITALS — BP 164/90 | HR 78 | Temp 98.7°F | Resp 16

## 2023-06-15 DIAGNOSIS — J439 Emphysema, unspecified: Secondary | ICD-10-CM

## 2023-06-15 DIAGNOSIS — D508 Other iron deficiency anemias: Secondary | ICD-10-CM

## 2023-06-15 DIAGNOSIS — R911 Solitary pulmonary nodule: Secondary | ICD-10-CM

## 2023-06-15 DIAGNOSIS — D509 Iron deficiency anemia, unspecified: Secondary | ICD-10-CM | POA: Diagnosis not present

## 2023-06-15 MED ORDER — SODIUM CHLORIDE 0.9% FLUSH
10.0000 mL | Freq: Once | INTRAVENOUS | Status: AC | PRN
  Administered 2023-06-15: 10 mL
  Filled 2023-06-15: qty 10

## 2023-06-15 MED ORDER — IRON SUCROSE 20 MG/ML IV SOLN
200.0000 mg | Freq: Once | INTRAVENOUS | Status: AC
  Administered 2023-06-15: 200 mg via INTRAVENOUS
  Filled 2023-06-15: qty 10

## 2023-06-15 NOTE — Progress Notes (Signed)
Declined 30 minute post-observation. Aware of risks. Vitals stable at discharge.

## 2023-06-15 NOTE — Patient Instructions (Signed)

## 2023-06-22 ENCOUNTER — Other Ambulatory Visit: Payer: Self-pay | Admitting: Gastroenterology

## 2023-06-22 DIAGNOSIS — K7460 Unspecified cirrhosis of liver: Secondary | ICD-10-CM

## 2023-06-29 ENCOUNTER — Ambulatory Visit
Admission: RE | Admit: 2023-06-29 | Discharge: 2023-06-29 | Disposition: A | Discharge status: Home / Self Care | Payer: Medicare Other | Source: Ambulatory Visit | Attending: Pulmonary Disease | Admitting: Pulmonary Disease

## 2023-06-29 ENCOUNTER — Ambulatory Visit
Admission: RE | Admit: 2023-06-29 | Discharge: 2023-06-29 | Disposition: A | Discharge status: Home / Self Care | Payer: Medicare Other | Source: Ambulatory Visit | Attending: Gastroenterology | Admitting: Gastroenterology

## 2023-06-29 DIAGNOSIS — M8588 Other specified disorders of bone density and structure, other site: Secondary | ICD-10-CM | POA: Insufficient documentation

## 2023-06-29 DIAGNOSIS — K7460 Unspecified cirrhosis of liver: Secondary | ICD-10-CM | POA: Diagnosis present

## 2023-06-29 DIAGNOSIS — J439 Emphysema, unspecified: Secondary | ICD-10-CM | POA: Insufficient documentation

## 2023-06-29 DIAGNOSIS — Z87891 Personal history of nicotine dependence: Secondary | ICD-10-CM | POA: Diagnosis not present

## 2023-06-29 DIAGNOSIS — R161 Splenomegaly, not elsewhere classified: Secondary | ICD-10-CM | POA: Diagnosis not present

## 2023-06-29 DIAGNOSIS — N281 Cyst of kidney, acquired: Secondary | ICD-10-CM | POA: Diagnosis not present

## 2023-06-29 DIAGNOSIS — I700 Atherosclerosis of aorta: Secondary | ICD-10-CM | POA: Diagnosis not present

## 2023-06-29 DIAGNOSIS — R918 Other nonspecific abnormal finding of lung field: Secondary | ICD-10-CM | POA: Diagnosis not present

## 2023-06-29 DIAGNOSIS — I2510 Atherosclerotic heart disease of native coronary artery without angina pectoris: Secondary | ICD-10-CM | POA: Diagnosis not present

## 2023-06-29 DIAGNOSIS — R911 Solitary pulmonary nodule: Secondary | ICD-10-CM | POA: Insufficient documentation

## 2023-09-07 ENCOUNTER — Encounter: Payer: Self-pay | Admitting: Oncology

## 2023-09-27 ENCOUNTER — Encounter: Payer: Self-pay | Admitting: Ophthalmology

## 2023-09-27 ENCOUNTER — Encounter: Payer: Self-pay | Admitting: Oncology

## 2023-09-27 NOTE — Anesthesia Preprocedure Evaluation (Addendum)
 Anesthesia Evaluation  Patient identified by MRN, date of birth, ID band Patient awake    Reviewed: Allergy & Precautions, H&P , NPO status , [name] Chart, lab work & pertinent test results  Airway Mallampati: III  TM Distance: >3 FB Neck ROM: Full    Dental no notable dental hx. (+) Lower Dentures, Upper Dentures   Pulmonary neg pulmonary ROS, asthma , COPD, former smoker   Pulmonary exam normal breath sounds clear to auscultation       Cardiovascular hypertension, negative cardio ROS Normal cardiovascular exam Rhythm:Regular Rate:Normal     Neuro/Psych   Anxiety      Neuromuscular disease CVA negative neurological ROS  negative psych ROS   GI/Hepatic negative GI ROS, Neg liver ROS,GERD  ,,  Endo/Other  negative endocrine ROSdiabetes    Renal/GU negative Renal ROS  negative genitourinary   Musculoskeletal negative musculoskeletal ROS (+) Arthritis ,    Abdominal   Peds negative pediatric ROS (+)  Hematology negative hematology ROS (+) Blood dyscrasia, anemia   Anesthesia Other Findings Patient extremely anxious, will give preop versed 2 mg IV, place on oxygen and pulse oximetry  Tobacco abuse  Hyperlipidemia  Hypertension GERD (gastroesophageal reflux disease) Vitamin B 12 deficiency Diabetes mellitus without complication  IBS (irritable bowel syndrome) Stroke  COPD (chronic obstructive pulmonary disease)  Asthma  Osteoporosis with current pathological fracture Chronic pruritus  History of uterine fibroid Hematuria, microscopic  Cirrhosis of liver (HCC) Proteinuria  History of ventral hernia Vitamin D deficiency  Anxiety IDA (iron  deficiency anemia)  Hamartomatous polyp of large intestine  Wears dentures   06-21-23 office notes nonalcoholic cirrhosis of the liver c/b Grade I esophageal varices, portal hypertensive gastropathy, and splenomegaly.    Reproductive/Obstetrics negative OB ROS                              Anesthesia Physical Anesthesia Plan  ASA: 4  Anesthesia Plan: MAC   Post-op Pain Management:    Induction: Intravenous  PONV Risk Score and Plan:   Airway Management Planned: Natural Airway and Nasal Cannula  Additional Equipment:   Intra-op Plan:   Post-operative Plan:   Informed Consent: I have reviewed the patients History and Physical, chart, labs and discussed the procedure including the risks, benefits and alternatives for the proposed anesthesia with the patient or authorized representative who has indicated his/her understanding and acceptance.     Dental Advisory Given  Plan Discussed with: Anesthesiologist, CRNA and Surgeon  Anesthesia Plan Comments: (Patient consented for risks of anesthesia including but not limited to:  - adverse reactions to medications - damage to eyes, teeth, lips or other oral mucosa - nerve damage due to positioning  - sore throat or hoarseness - Damage to heart, brain, nerves, lungs, other parts of body or loss of life  Patient voiced understanding and assent.)        Anesthesia Quick Evaluation

## 2023-09-28 NOTE — Discharge Instructions (Signed)

## 2023-09-29 ENCOUNTER — Ambulatory Visit
Admission: RE | Admit: 2023-09-29 | Discharge: 2023-09-29 | Disposition: A | Discharge status: Home / Self Care | Attending: Ophthalmology | Admitting: Ophthalmology

## 2023-09-29 ENCOUNTER — Ambulatory Visit: Payer: Self-pay | Admitting: Anesthesiology

## 2023-09-29 ENCOUNTER — Encounter: Payer: Self-pay | Admitting: Ophthalmology

## 2023-09-29 ENCOUNTER — Other Ambulatory Visit: Payer: Self-pay

## 2023-09-29 ENCOUNTER — Encounter
Admission: RE | Disposition: A | Discharge status: Home / Self Care | Payer: Self-pay | Source: Home / Self Care | Attending: Ophthalmology

## 2023-09-29 DIAGNOSIS — Z8673 Personal history of transient ischemic attack (TIA), and cerebral infarction without residual deficits: Secondary | ICD-10-CM | POA: Insufficient documentation

## 2023-09-29 DIAGNOSIS — E1136 Type 2 diabetes mellitus with diabetic cataract: Secondary | ICD-10-CM | POA: Insufficient documentation

## 2023-09-29 DIAGNOSIS — G709 Myoneural disorder, unspecified: Secondary | ICD-10-CM | POA: Diagnosis not present

## 2023-09-29 DIAGNOSIS — I10 Essential (primary) hypertension: Secondary | ICD-10-CM | POA: Diagnosis not present

## 2023-09-29 DIAGNOSIS — Z7984 Long term (current) use of oral hypoglycemic drugs: Secondary | ICD-10-CM | POA: Insufficient documentation

## 2023-09-29 DIAGNOSIS — J4489 Other specified chronic obstructive pulmonary disease: Secondary | ICD-10-CM | POA: Diagnosis not present

## 2023-09-29 DIAGNOSIS — K219 Gastro-esophageal reflux disease without esophagitis: Secondary | ICD-10-CM | POA: Diagnosis not present

## 2023-09-29 DIAGNOSIS — M1990 Unspecified osteoarthritis, unspecified site: Secondary | ICD-10-CM | POA: Diagnosis not present

## 2023-09-29 DIAGNOSIS — Z87891 Personal history of nicotine dependence: Secondary | ICD-10-CM | POA: Insufficient documentation

## 2023-09-29 DIAGNOSIS — H2511 Age-related nuclear cataract, right eye: Secondary | ICD-10-CM | POA: Diagnosis present

## 2023-09-29 HISTORY — DX: Presence of dental prosthetic device (complete) (partial): Z97.2

## 2023-09-29 HISTORY — DX: Personal history of other diseases of the digestive system: Z87.19

## 2023-09-29 HISTORY — PX: CATARACT EXTRACTION W/PHACO: SHX586

## 2023-09-29 HISTORY — DX: Other diseases of stomach and duodenum: K31.89

## 2023-09-29 LAB — GLUCOSE, CAPILLARY: Glucose-Capillary: 117 mg/dL — ABNORMAL HIGH (ref 70–99)

## 2023-09-29 SURGERY — PHACOEMULSIFICATION, CATARACT, WITH IOL INSERTION
Anesthesia: Monitor Anesthesia Care | Site: Eye | Laterality: Right

## 2023-09-29 MED ORDER — MIDAZOLAM HCL 2 MG/2ML IJ SOLN
INTRAMUSCULAR | Status: AC
Start: 2023-09-29 — End: ?
  Filled 2023-09-29: qty 2

## 2023-09-29 MED ORDER — ARMC OPHTHALMIC DILATING DROPS
1.0000 | OPHTHALMIC | Status: DC | PRN
  Administered 2023-09-29 (×3): 1 via OPHTHALMIC

## 2023-09-29 MED ORDER — MIDAZOLAM HCL 2 MG/2ML IJ SOLN
2.0000 mg | Freq: Once | INTRAMUSCULAR | Status: AC
  Administered 2023-09-29: 2 mg via INTRAVENOUS

## 2023-09-29 MED ORDER — MIDAZOLAM HCL 2 MG/2ML IJ SOLN
INTRAMUSCULAR | Status: DC | PRN
  Administered 2023-09-29: 1 mg via INTRAVENOUS

## 2023-09-29 MED ORDER — LIDOCAINE HCL (PF) 2 % IJ SOLN
INTRAOCULAR | Status: DC | PRN
  Administered 2023-09-29: 2 mL

## 2023-09-29 MED ORDER — BRIMONIDINE TARTRATE-TIMOLOL 0.2-0.5 % OP SOLN
OPHTHALMIC | Status: DC | PRN
  Administered 2023-09-29: 1 [drp] via OPHTHALMIC

## 2023-09-29 MED ORDER — MOXIFLOXACIN HCL 0.5 % OP SOLN
OPHTHALMIC | Status: DC | PRN
  Administered 2023-09-29: .2 mL via OPHTHALMIC

## 2023-09-29 MED ORDER — SIGHTPATH DOSE#1 BSS IO SOLN
INTRAOCULAR | Status: DC | PRN
  Administered 2023-09-29: 15 mL via INTRAOCULAR

## 2023-09-29 MED ORDER — SIGHTPATH DOSE#1 BSS IO SOLN
INTRAOCULAR | Status: DC | PRN
  Administered 2023-09-29: 42 mL via OPHTHALMIC

## 2023-09-29 MED ORDER — FENTANYL CITRATE (PF) 100 MCG/2ML IJ SOLN
INTRAMUSCULAR | Status: DC | PRN
  Administered 2023-09-29 (×2): 25 ug via INTRAVENOUS

## 2023-09-29 MED ORDER — ARMC OPHTHALMIC DILATING DROPS
OPHTHALMIC | Status: AC
  Filled 2023-09-29: qty 0.5

## 2023-09-29 MED ORDER — TETRACAINE HCL 0.5 % OP SOLN
1.0000 [drp] | OPHTHALMIC | Status: DC | PRN
Start: 2023-09-29 — End: 2023-09-29
  Administered 2023-09-29 (×3): 1 [drp] via OPHTHALMIC

## 2023-09-29 MED ORDER — SIGHTPATH DOSE#1 NA HYALUR & NA CHOND-NA HYALUR IO KIT
PACK | INTRAOCULAR | Status: DC | PRN
  Administered 2023-09-29: 1 via OPHTHALMIC

## 2023-09-29 MED ORDER — MIDAZOLAM HCL 2 MG/2ML IJ SOLN
INTRAMUSCULAR | Status: AC
  Filled 2023-09-29: qty 2

## 2023-09-29 MED ORDER — TETRACAINE HCL 0.5 % OP SOLN
OPHTHALMIC | Status: AC
  Filled 2023-09-29: qty 4

## 2023-09-29 MED ORDER — FENTANYL CITRATE (PF) 100 MCG/2ML IJ SOLN
INTRAMUSCULAR | Status: AC
  Filled 2023-09-29: qty 2

## 2023-09-29 SURGICAL SUPPLY — 10 items
CATARACT SUITE SIGHTPATH (MISCELLANEOUS) ×1 IMPLANT
FEE CATARACT SUITE SIGHTPATH (MISCELLANEOUS) ×1 IMPLANT
GLOVE BIOGEL PI IND STRL 8 (GLOVE) ×1 IMPLANT
GLOVE SURG LX STRL 7.5 STRW (GLOVE) ×1 IMPLANT
GLOVE SURG PROTEXIS BL SZ6.5 (GLOVE) ×1 IMPLANT
GLOVE SURG SYN 6.5 PF PI BL (GLOVE) ×1 IMPLANT
LENS IOL TECNIS EYHANCE 23.5 (Intraocular Lens) IMPLANT
NDL FILTER BLUNT 18X1 1/2 (NEEDLE) ×1 IMPLANT
NEEDLE FILTER BLUNT 18X1 1/2 (NEEDLE) ×1 IMPLANT
SYR 3ML LL SCALE MARK (SYRINGE) ×1 IMPLANT

## 2023-09-29 NOTE — Op Note (Signed)
 LOCATION:  [facility]   PREOPERATIVE DIAGNOSIS:    Nuclear sclerotic cataract right eye. H25.11   POSTOPERATIVE DIAGNOSIS:  Nuclear sclerotic cataract right eye.     PROCEDURE:  Phacoemusification with posterior chamber intraocular lens placement of the right eye   ULTRASOUND TIME: Procedure(s): PHACOEMULSIFICATION, CATARACT, WITH IOL INSERTION 5.23 00:32.6 (Right)  LENS:   Implant Name Type Inv. Item Serial No. Manufacturer Lot No. LRB No. Used Action  LENS IOL TECNIS EYHANCE 23.5 - [id number] Intraocular Lens LENS IOL TECNIS EYHANCE 23.5 [id number] [employer]  Right 1 Implanted         SURGEON:  Berline Brenner, MD   ANESTHESIA:  Topical with tetracaine  drops and 2% Xylocaine  jelly, augmented with 1% preservative-free intracameral lidocaine .    COMPLICATIONS:  None.   DESCRIPTION OF PROCEDURE:  The patient was identified in the holding room and transported to the operating room and placed in the supine position under the operating microscope.  The right eye was identified as the operative eye and it was prepped and draped in the usual sterile ophthalmic fashion.   A 1 millimeter clear-corneal paracentesis was made at the 12:00 position.  0.5 ml of preservative-free 1% lidocaine  was injected into the anterior chamber. The anterior chamber was filled with Viscoat viscoelastic.  A 2.4 millimeter keratome was used to make a near-clear corneal incision at the 9:00 position.  A curvilinear capsulorrhexis was made with a cystotome and capsulorrhexis forceps.  Balanced salt solution was used to hydrodissect and hydrodelineate the nucleus.   Phacoemulsification was then used in stop and chop fashion to remove the lens nucleus and epinucleus.  The remaining cortex was then removed using the irrigation and aspiration handpiece. Provisc was then placed into the capsular bag to distend it for lens placement.  A lens was then injected into the capsular bag.  The remaining  viscoelastic was aspirated.  The capsule appeared to have an inferior zonular weakness versus peripheral capsular tear inferiorly.  No citreous presented and the lens remained centered after aspiration of viscoelastic. Wounds were hydrated with balanced salt solution.  The anterior chamber was inflated to a physiologic pressure with balanced salt solution.  No wound leaks were noted. Vigamox 0.2 ml of a 1mg  per ml solution was injected into the anterior chamber for a dose of 0.2 mg of intracameral antibiotic at the completion of the case.   Timolol and Brimonidine drops were applied to the eye.  The patient was taken to the recovery room in stable condition without complications of anesthesia or surgery.   Evadna Donaghy 09/29/2023, 11:13 AM

## 2023-09-29 NOTE — H&P (Signed)
 [facility]   Primary Care Physician:  Melchor Spoon, MD Ophthalmologist: Dr. Annell Kidney  Pre-Procedure History & Physical: HPI:  Brenda Beasley is a 73 y.o. female here for ophthalmic surgery.   Past Medical History:  Diagnosis Date   Anxiety    Asthma    Chronic pruritus    Cirrhosis of liver (HCC)    COPD (chronic obstructive pulmonary disease) (HCC)    Diabetes mellitus without complication (HCC)    Family history of breast cancer    Family history of colon cancer    Family history of kidney cancer    Family history of lung cancer    GERD (gastroesophageal reflux disease)    H/O esophageal varices    Hamartomatous polyp of large intestine (HCC)    Hematuria, microscopic    History of uterine fibroid    History of ventral hernia    Hyperlipidemia    Hypertension    IBS (irritable bowel syndrome)    IDA (iron  deficiency anemia) 11/13/2020   Osteoporosis with current pathological fracture 11/03/2018   Personal history of tobacco use, presenting hazards to health 09/20/2015   Portal hypertensive gastropathy (HCC)    Proteinuria    Stroke (HCC)    Tobacco abuse 10/04/2014   Vitamin B 12 deficiency    Vitamin D deficiency    Wears dentures    full upper and lower    Past Surgical History:  Procedure Laterality Date   COLON SURGERY     COLONOSCOPY N/A 01/09/2021   Procedure: COLONOSCOPY;  Surgeon: Quintin Buckle, DO;  Location: [facility] ENDOSCOPY;  Service: Gastroenterology;  Laterality: N/A;   COLONOSCOPY WITH PROPOFOL  N/A 11/23/2018   Procedure: COLONOSCOPY WITH PROPOFOL ;  Surgeon: Toledo, Alphonsus Jeans, MD;  Location: [facility] ENDOSCOPY;  Service: Gastroenterology;  Laterality: N/A;   ESOPHAGOGASTRODUODENOSCOPY N/A 01/09/2021   Procedure: ESOPHAGOGASTRODUODENOSCOPY (EGD);  Surgeon: Quintin Buckle, DO;  Location: [facility] ENDOSCOPY;  Service: Gastroenterology;  Laterality: N/A;   ESOPHAGOGASTRODUODENOSCOPY N/A 07/02/2022   Procedure:  ESOPHAGOGASTRODUODENOSCOPY (EGD);  Surgeon: Quintin Buckle, DO;  Location: [facility] ENDOSCOPY;  Service: Gastroenterology;  Laterality: N/A;   ESOPHAGOGASTRODUODENOSCOPY (EGD) WITH PROPOFOL  N/A 11/23/2018   Procedure: ESOPHAGOGASTRODUODENOSCOPY (EGD) WITH PROPOFOL ;  Surgeon: Toledo, Alphonsus Jeans, MD;  Location: [facility] ENDOSCOPY;  Service: Gastroenterology;  Laterality: N/A;   ESOPHAGOGASTRODUODENOSCOPY (EGD) WITH PROPOFOL  N/A 01/17/2021   Procedure: ESOPHAGOGASTRODUODENOSCOPY (EGD) WITH PROPOFOL ;  Surgeon: Selena Daily, MD;  Location: [facility] ENDOSCOPY;  Service: Gastroenterology;  Laterality: N/A;   HERNIA REPAIR     PARTIAL COLECTOMY     TONSILLECTOMY     VENTRAL HERNIA REPAIR      Prior to Admission medications   Medication Sig Start Date End Date Taking? Authorizing Provider  albuterol  (VENTOLIN  HFA) 108 (90 Base) MCG/ACT inhaler SMARTSIG:2 Puff(s) By Mouth Every 6 Hours PRN   Yes [provider]  BD PEN NEEDLE NANO 2ND GEN 32G X 4 MM MISC USE 1 EACH ONCE DAILY 03/25/23  Yes [provider]  budesonide (PULMICORT) 0.5 MG/2ML nebulizer solution Take 0.5 mg by nebulization 2 (two) times daily.   Yes [provider]  Cholecalciferol (VITAMIN D3) 5000 units TABS Take 5,000 Units by mouth daily.    Yes [provider]  Cyanocobalamin  (VITAMIN B-12 PO) Take by mouth daily.   Yes [provider]  fluticasone  (FLONASE ) 50 MCG/ACT nasal spray Place 2 sprays into the nose daily. 03/05/15  Yes [provider]  gabapentin (NEURONTIN) 100 MG capsule Take 100  mg by mouth at bedtime as needed.   Yes [provider]  glipiZIDE (GLUCOTROL) 10 MG tablet Take 20 mg by mouth 2 (two) times daily. 02/28/21  Yes [provider]  hydrochlorothiazide  (HYDRODIURIL ) 25 MG tablet Hold until followup with outpatient doctor due to intermittent low blood pressure. 01/17/21  Yes Garrison Kanner, MD  lisinopril  (ZESTRIL ) 40 MG tablet Hold until followup with  outpatient doctor due to intermittent low blood pressure. 01/17/21  Yes Garrison Kanner, MD  lovastatin (MEVACOR) 40 MG tablet Take 2 tablets by mouth daily. 03/05/15  Yes [provider]  MAGNESIUM  PO Take 1 tablet by mouth at bedtime.   Yes [provider]  metFORMIN (GLUCOPHAGE) 1000 MG tablet Take 1,000 mg by mouth 2 (two) times daily. 03/05/15  Yes [provider]  omeprazole (PRILOSEC) 20 MG capsule Take 20 mg by mouth 2 (two) times daily. 03/05/15  Yes [provider]  TRESIBA FLEXTOUCH 200 UNIT/ML FlexTouch Pen Inject 54 Units into the skin daily. 09/17/22  Yes [provider]  vitamin C (ASCORBIC ACID) 250 MG tablet Take 250 mg by mouth daily.   Yes [provider]  Insulin  Glargine, 2 Unit Dial, (TOUJEO MAX SOLOSTAR) 300 UNIT/ML SOPN Inject 300 Units/mL into the skin at bedtime. Patient not taking: Reported on 01/12/2023    [provider]  torsemide (DEMADEX) 20 MG tablet Take by mouth. Patient not taking: Reported on 09/27/2023 02/08/23   [provider]    Allergies as of 08/13/2023 - Review Complete 06/08/2023  Allergen Reaction Noted   Molds & smuts Shortness Of Breath 09/29/2013   Other Shortness Of Breath and Hives 11/04/2015   Penicillins Hives and Swelling 11/04/2015   Pioglitazone Other (See Comments) and Palpitations 12/05/2015   Byetta 10 mcg pen [exenatide] Other (See Comments) 11/04/2015   Dapagliflozin Other (See Comments) 11/03/2016   Jardiance [empagliflozin] Other (See Comments) 11/22/2018   Victoza [liraglutide] Other (See Comments) 11/04/2015   Sulfa antibiotics Rash 11/04/2015   Sulfasalazine Rash 11/22/2018    Family History  Problem Relation Age of Onset   Diabetes Mother    Diabetes Father    Diabetes Sister    Diabetes Maternal Grandmother    Diabetes Paternal Grandmother     Social History   Socioeconomic History   Marital status: Widowed    Spouse name: Not on file   Number of  children: Not on file   Years of education: Not on file   Highest education level: Not on file  Occupational History   Not on file  Tobacco Use   Smoking status: Former    Current packs/day: 0.00    Average packs/day: 1 pack/day for 52.0 years (52.0 ttl pk-yrs)    Types: Cigarettes    Start date: 06/12/1966    Quit date: 06/12/2018    Years since quitting: 5.3   Smokeless tobacco: Never  Vaping Use   Vaping status: Never Used  Substance and Sexual Activity   Alcohol use: No    Alcohol/week: 0.0 standard drinks of alcohol   Drug use: Not Currently   Sexual activity: Not on file  Other Topics Concern   Not on file  Social History Narrative   Not on file   Social Drivers of Health   Financial Resource Strain: Low Risk  (07/26/2023)   Received from [facility]   Overall Financial Resource Strain (CARDIA)    Difficulty of Paying Living Expenses: Not hard at all  Food Insecurity: No  Food Insecurity (07/26/2023)   Received from [facility]   Hunger Vital Sign    Worried About Running Out of Food in the Last Year: Never true    Ran Out of Food in the Last Year: Never true  Transportation Needs: No Transportation Needs (07/26/2023)   Received from [facility] - [facility]    In the past 12 months, has lack of transportation kept you from medical appointments or from getting medications?: No    Lack of Transportation (Non-Medical): No  Physical Activity: Not on file  Stress: Not on file  Social Connections: Not on file  Intimate Partner Violence: Not on file    Review of Systems: See HPI, otherwise negative ROS  Physical Exam: BP (!) 156/60   Pulse 74   Resp 19   Ht 4\' 11"  (1.499 m)   Wt 75.6 kg   SpO2 95%   BMI 33.65 kg/m  General:   Alert,  pleasant and cooperative in NAD Head:  Normocephalic and atraumatic. Lungs:  Clear to auscultation.    Heart:  Regular rate and rhythm.    Impression/Plan: Brenda Beasley is here for ophthalmic surgery.  Risks, benefits, limitations, and alternatives regarding ophthalmic surgery have been reviewed with the patient.  Questions have been answered.  All parties agreeable.   Annell Kidney, MD  09/29/2023, 10:13 AM

## 2023-09-29 NOTE — Transfer of Care (Signed)
 Immediate Anesthesia Transfer of Care Note  Patient: Brenda Beasley  Procedure(s) Performed: PHACOEMULSIFICATION, CATARACT, WITH IOL INSERTION 5.23 00:32.6 (Right: Eye)  Patient Location: PACU  Anesthesia Type: MAC  Level of Consciousness: awake, alert  and patient cooperative  Airway and Oxygen Therapy: Patient Spontanous Breathing and Patient connected to supplemental oxygen  Post-op Assessment: Post-op Vital signs reviewed, [name] Cardiovascular Status Stable, Respiratory Function Stable, Patent Airway and No signs of Nausea or vomiting  Post-op Vital Signs: Reviewed and stable  Complications: No notable events documented.

## 2023-09-29 NOTE — Anesthesia Postprocedure Evaluation (Signed)
 Anesthesia Post Note  Patient: Brenda Beasley  Procedure(s) Performed: PHACOEMULSIFICATION, CATARACT, WITH IOL INSERTION 5.23 00:32.6 (Right: Eye)  Patient location during evaluation: PACU Anesthesia Type: MAC Level of consciousness: awake and alert Pain management: pain level controlled Vital Signs Assessment: post-procedure vital signs reviewed and stable Respiratory status: spontaneous breathing, nonlabored ventilation, respiratory function stable and patient connected to nasal cannula oxygen Cardiovascular status: stable and blood pressure returned to baseline Postop Assessment: no apparent nausea or vomiting Anesthetic complications: no   No notable events documented.   Last Vitals:  Vitals:   09/29/23 1114 09/29/23 1119  BP:  (!) 150/68  Pulse:    Resp:    Temp: 36.7 C 36.7 C  SpO2:      Last Pain:  Vitals:   09/29/23 1119  TempSrc:   PainSc: 0-No pain                 Greyson Peavy C Kaelyn Nauta

## 2023-09-30 ENCOUNTER — Encounter: Payer: Self-pay | Admitting: Ophthalmology

## 2023-10-08 NOTE — Anesthesia Preprocedure Evaluation (Addendum)
 Anesthesia Evaluation  Patient identified by MRN, date of birth, ID band Patient awake    Reviewed: Allergy & Precautions, H&P , NPO status , [name] Chart, lab work & pertinent test results  Airway Mallampati: III  TM Distance: >3 FB Neck ROM: Full    Dental no notable dental hx. (+) Upper Dentures, Lower Dentures   Pulmonary neg pulmonary ROS, asthma , COPD, former smoker   Pulmonary exam normal breath sounds clear to auscultation       Cardiovascular hypertension, negative cardio ROS Normal cardiovascular exam Rhythm:Regular Rate:Normal     Neuro/Psych   Anxiety      Neuromuscular disease CVA negative neurological ROS  negative psych ROS   GI/Hepatic negative GI ROS, Neg liver ROS,GERD  ,,  Endo/Other  negative endocrine ROSdiabetes    Renal/GU negative Renal ROS  negative genitourinary   Musculoskeletal negative musculoskeletal ROS (+) Arthritis ,    Abdominal   Peds negative pediatric ROS (+)  Hematology negative hematology ROS (+) Blood dyscrasia, anemia   Anesthesia Other Findings Previous cataract surgery 09-29-23 Dr. Aldo Amble  Plan versed  2 mg IV preop, on nasal cannula oxygen 2L/m/n/c and pulse oximetry   Previous surgery, Patient extremely anxious, received preop versed  2 mg IV, will do same today, per patient request. also placed on oxygen and pulse oximetry after versed  givne.  Tobacco abuse  Hyperlipidemia             Hypertension GERD (gastroesophageal reflux disease) Vitamin B 12 deficiency Diabetes mellitus without complication  IBS (irritable bowel syndrome) Stroke  COPD (chronic obstructive pulmonary disease)  Asthma             Osteoporosis with current pathological fracture Chronic pruritus            History of uterine fibroid Hematuria, microscopic     Cirrhosis of liver (HCC) Proteinuria             History of ventral hernia Vitamin D deficiency    Anxiety IDA (iron  deficiency  anemia)         Hamartomatous polyp of large intestine  Wears dentures              06-21-23 office notes nonalcoholic cirrhosis of the liver c/b Grade I esophageal varices, portal hypertensive gastropathy, and splenomegaly.     Reproductive/Obstetrics negative OB ROS                             Anesthesia Physical Anesthesia Plan  ASA: 4  Anesthesia Plan: MAC   Post-op Pain Management:    Induction: Intravenous  PONV Risk Score and Plan:   Airway Management Planned: Natural Airway and Nasal Cannula  Additional Equipment:   Intra-op Plan:   Post-operative Plan:   Informed Consent: I have reviewed the patients History and Physical, chart, labs and discussed the procedure including the risks, benefits and alternatives for the proposed anesthesia with the patient or authorized representative who has indicated his/her understanding and acceptance.     Dental Advisory Given  Plan Discussed with: Anesthesiologist, CRNA and Surgeon  Anesthesia Plan Comments: (Patient consented for risks of anesthesia including but not limited to:  - adverse reactions to medications - damage to eyes, teeth, lips or other oral mucosa - nerve damage due to positioning  - sore throat or hoarseness - Damage to heart, brain, nerves, lungs, other parts of body or loss of life  Patient voiced understanding and assent.)  Anesthesia Quick Evaluation

## 2023-10-12 ENCOUNTER — Inpatient Hospital Stay: Payer: Medicare Other | Attending: Oncology

## 2023-10-12 DIAGNOSIS — D696 Thrombocytopenia, unspecified: Secondary | ICD-10-CM | POA: Diagnosis not present

## 2023-10-12 DIAGNOSIS — D509 Iron deficiency anemia, unspecified: Secondary | ICD-10-CM | POA: Diagnosis present

## 2023-10-12 DIAGNOSIS — K7460 Unspecified cirrhosis of liver: Secondary | ICD-10-CM | POA: Insufficient documentation

## 2023-10-12 DIAGNOSIS — D500 Iron deficiency anemia secondary to blood loss (chronic): Secondary | ICD-10-CM

## 2023-10-12 LAB — CBC WITH DIFFERENTIAL (CANCER CENTER ONLY)
Abs Immature Granulocytes: 0.02 10*3/uL (ref 0.00–0.07)
Basophils Absolute: 0 10*3/uL (ref 0.0–0.1)
Basophils Relative: 1 %
Eosinophils Absolute: 0.1 10*3/uL (ref 0.0–0.5)
Eosinophils Relative: 2 %
HCT: 31.8 % — ABNORMAL LOW (ref 36.0–46.0)
Hemoglobin: 9.7 g/dL — ABNORMAL LOW (ref 12.0–15.0)
Immature Granulocytes: 0 %
Lymphocytes Relative: 20 %
Lymphs Abs: 1 10*3/uL (ref 0.7–4.0)
MCH: 23.8 pg — ABNORMAL LOW (ref 26.0–34.0)
MCHC: 30.5 g/dL (ref 30.0–36.0)
MCV: 78.1 fL — ABNORMAL LOW (ref 80.0–100.0)
Monocytes Absolute: 0.4 10*3/uL (ref 0.1–1.0)
Monocytes Relative: 9 %
Neutro Abs: 3.3 10*3/uL (ref 1.7–7.7)
Neutrophils Relative %: 68 %
Platelet Count: 102 10*3/uL — ABNORMAL LOW (ref 150–400)
RBC: 4.07 MIL/uL (ref 3.87–5.11)
RDW: 16 % — ABNORMAL HIGH (ref 11.5–15.5)
WBC Count: 4.9 10*3/uL (ref 4.0–10.5)
nRBC: 0 % (ref 0.0–0.2)

## 2023-10-12 LAB — RETIC PANEL
Immature Retic Fract: 11.4 % (ref 2.3–15.9)
RBC.: 4.01 MIL/uL (ref 3.87–5.11)
Retic Count, Absolute: 63 10*3/uL (ref 19.0–186.0)
Retic Ct Pct: 1.6 % (ref 0.4–3.1)
Reticulocyte Hemoglobin: 24.9 pg — ABNORMAL LOW (ref >27.9)

## 2023-10-12 LAB — IRON AND TIBC
Iron: 32 ug/dL (ref 28–170)
Saturation Ratios: 6 % — ABNORMAL LOW (ref 10.4–31.8)
TIBC: 512 ug/dL — ABNORMAL HIGH (ref 250–450)
UIBC: 480 ug/dL

## 2023-10-12 LAB — FERRITIN: Ferritin: 12 ng/mL (ref 11–307)

## 2023-10-12 NOTE — Discharge Instructions (Signed)

## 2023-10-13 ENCOUNTER — Ambulatory Visit
Admission: RE | Admit: 2023-10-13 | Discharge: 2023-10-13 | Disposition: A | Discharge status: Home / Self Care | Attending: Ophthalmology | Admitting: Ophthalmology

## 2023-10-13 ENCOUNTER — Encounter: Payer: Self-pay | Admitting: Ophthalmology

## 2023-10-13 ENCOUNTER — Other Ambulatory Visit: Payer: Self-pay

## 2023-10-13 ENCOUNTER — Ambulatory Visit: Payer: Self-pay | Admitting: Anesthesiology

## 2023-10-13 ENCOUNTER — Encounter
Admission: RE | Disposition: A | Discharge status: Home / Self Care | Payer: Self-pay | Source: Home / Self Care | Attending: Ophthalmology

## 2023-10-13 DIAGNOSIS — E1136 Type 2 diabetes mellitus with diabetic cataract: Secondary | ICD-10-CM | POA: Insufficient documentation

## 2023-10-13 DIAGNOSIS — G709 Myoneural disorder, unspecified: Secondary | ICD-10-CM | POA: Insufficient documentation

## 2023-10-13 DIAGNOSIS — J4489 Other specified chronic obstructive pulmonary disease: Secondary | ICD-10-CM | POA: Insufficient documentation

## 2023-10-13 DIAGNOSIS — K219 Gastro-esophageal reflux disease without esophagitis: Secondary | ICD-10-CM | POA: Diagnosis not present

## 2023-10-13 DIAGNOSIS — Z8673 Personal history of transient ischemic attack (TIA), and cerebral infarction without residual deficits: Secondary | ICD-10-CM | POA: Insufficient documentation

## 2023-10-13 DIAGNOSIS — H2512 Age-related nuclear cataract, left eye: Secondary | ICD-10-CM | POA: Diagnosis present

## 2023-10-13 DIAGNOSIS — Z7984 Long term (current) use of oral hypoglycemic drugs: Secondary | ICD-10-CM | POA: Diagnosis not present

## 2023-10-13 DIAGNOSIS — E119 Type 2 diabetes mellitus without complications: Secondary | ICD-10-CM | POA: Diagnosis not present

## 2023-10-13 DIAGNOSIS — I10 Essential (primary) hypertension: Secondary | ICD-10-CM | POA: Diagnosis not present

## 2023-10-13 DIAGNOSIS — Z87891 Personal history of nicotine dependence: Secondary | ICD-10-CM | POA: Insufficient documentation

## 2023-10-13 HISTORY — PX: CATARACT EXTRACTION W/PHACO: SHX586

## 2023-10-13 LAB — GLUCOSE, CAPILLARY: Glucose-Capillary: 152 mg/dL — ABNORMAL HIGH (ref 70–99)

## 2023-10-13 SURGERY — PHACOEMULSIFICATION, CATARACT, WITH IOL INSERTION
Anesthesia: Monitor Anesthesia Care | Laterality: Left

## 2023-10-13 MED ORDER — TETRACAINE HCL 0.5 % OP SOLN
OPHTHALMIC | Status: AC
  Filled 2023-10-13: qty 4

## 2023-10-13 MED ORDER — LIDOCAINE HCL (PF) 2 % IJ SOLN
INTRAOCULAR | Status: DC | PRN
  Administered 2023-10-13: 1 mL

## 2023-10-13 MED ORDER — SIGHTPATH DOSE#1 BSS IO SOLN
INTRAOCULAR | Status: DC | PRN
  Administered 2023-10-13: 15 mL via INTRAOCULAR

## 2023-10-13 MED ORDER — SIGHTPATH DOSE#1 NA HYALUR & NA CHOND-NA HYALUR IO KIT
PACK | INTRAOCULAR | Status: DC | PRN
  Administered 2023-10-13: 1 via OPHTHALMIC

## 2023-10-13 MED ORDER — FENTANYL CITRATE (PF) 100 MCG/2ML IJ SOLN
INTRAMUSCULAR | Status: AC
Start: 2023-10-13 — End: ?
  Filled 2023-10-13: qty 2

## 2023-10-13 MED ORDER — FENTANYL CITRATE (PF) 100 MCG/2ML IJ SOLN
INTRAMUSCULAR | Status: DC | PRN
  Administered 2023-10-13: 25 ug via INTRAVENOUS
  Administered 2023-10-13: 75 ug via INTRAVENOUS

## 2023-10-13 MED ORDER — MIDAZOLAM HCL 2 MG/2ML IJ SOLN
2.0000 mg | Freq: Once | INTRAMUSCULAR | Status: AC
  Administered 2023-10-13: 2 mg via INTRAVENOUS

## 2023-10-13 MED ORDER — MIDAZOLAM HCL 2 MG/2ML IJ SOLN
INTRAMUSCULAR | Status: AC
  Filled 2023-10-13: qty 2

## 2023-10-13 MED ORDER — SODIUM CHLORIDE 0.9% FLUSH
INTRAVENOUS | Status: DC | PRN
  Administered 2023-10-13 (×2): 10 mL via INTRAVENOUS

## 2023-10-13 MED ORDER — MOXIFLOXACIN HCL 0.5 % OP SOLN
OPHTHALMIC | Status: DC | PRN
  Administered 2023-10-13: .2 mL via OPHTHALMIC

## 2023-10-13 MED ORDER — BRIMONIDINE TARTRATE-TIMOLOL 0.2-0.5 % OP SOLN
OPHTHALMIC | Status: DC | PRN
  Administered 2023-10-13: 1 [drp] via OPHTHALMIC

## 2023-10-13 MED ORDER — SIGHTPATH DOSE#1 BSS IO SOLN
INTRAOCULAR | Status: DC | PRN
  Administered 2023-10-13: 96 mL via OPHTHALMIC

## 2023-10-13 MED ORDER — ARMC OPHTHALMIC DILATING DROPS
OPHTHALMIC | Status: AC
  Filled 2023-10-13: qty 0.5

## 2023-10-13 MED ORDER — TETRACAINE HCL 0.5 % OP SOLN
1.0000 [drp] | OPHTHALMIC | Status: DC | PRN
  Administered 2023-10-13 (×3): 1 [drp] via OPHTHALMIC

## 2023-10-13 MED ORDER — ARMC OPHTHALMIC DILATING DROPS
1.0000 | OPHTHALMIC | Status: DC | PRN
  Administered 2023-10-13 (×3): 1 via OPHTHALMIC

## 2023-10-13 SURGICAL SUPPLY — 10 items
CATARACT SUITE SIGHTPATH (MISCELLANEOUS) ×1 IMPLANT
FEE CATARACT SUITE SIGHTPATH (MISCELLANEOUS) ×1 IMPLANT
GLOVE BIOGEL PI IND STRL 8 (GLOVE) ×1 IMPLANT
GLOVE SURG LX STRL 7.5 STRW (GLOVE) ×1 IMPLANT
GLOVE SURG PROTEXIS BL SZ6.5 (GLOVE) ×1 IMPLANT
GLOVE SURG SYN 6.5 PF PI BL (GLOVE) ×1 IMPLANT
LENS IOL TECNIS EYHANCE 23.5 (Intraocular Lens) IMPLANT
NDL FILTER BLUNT 18X1 1/2 (NEEDLE) ×1 IMPLANT
NEEDLE FILTER BLUNT 18X1 1/2 (NEEDLE) ×1 IMPLANT
SYR 3ML LL SCALE MARK (SYRINGE) ×1 IMPLANT

## 2023-10-13 NOTE — Transfer of Care (Signed)
 Immediate Anesthesia Transfer of Care Note  Patient: Brenda Beasley  Procedure(s) Performed: PHACOEMULSIFICATION, CATARACT, WITH IOL INSERTION 4.80, 00:31.5 (Left)  Patient Location: PACU  Anesthesia Type:MAC  Level of Consciousness: awake and alert   Airway & Oxygen Therapy: Patient Spontanous Breathing  Post-op Assessment: Report given to RN and Post -op Vital signs reviewed and stable  Post vital signs: Reviewed and stable  Last Vitals:  Vitals Value Taken Time  BP 135/48 10/13/23 0755  Temp 36.4 C 10/13/23 0755  Pulse 71 10/13/23 0757  Resp 18 10/13/23 0757  SpO2 94 % 10/13/23 0757  Vitals shown include unfiled device data.  Last Pain:  Vitals:   10/13/23 0755  TempSrc:   PainSc: 0-No pain         Complications: No notable events documented.

## 2023-10-13 NOTE — H&P (Signed)
 [facility]   Primary Care Physician:  Melchor Spoon, MD Ophthalmologist: Dr. Annell Kidney  Pre-Procedure History & Physical: HPI:  Brenda Beasley is a 73 y.o. female here for ophthalmic surgery.   Past Medical History:  Diagnosis Date   Anxiety    Asthma    Chronic pruritus    Cirrhosis of liver (HCC)    COPD (chronic obstructive pulmonary disease) (HCC)    Diabetes mellitus without complication (HCC)    Family history of breast cancer    Family history of colon cancer    Family history of kidney cancer    Family history of lung cancer    GERD (gastroesophageal reflux disease)    H/O esophageal varices    Hamartomatous polyp of large intestine (HCC)    Hematuria, microscopic    History of uterine fibroid    History of ventral hernia    Hyperlipidemia    Hypertension    IBS (irritable bowel syndrome)    IDA (iron  deficiency anemia) 11/13/2020   Osteoporosis with current pathological fracture 11/03/2018   Personal history of tobacco use, presenting hazards to health 09/20/2015   Portal hypertensive gastropathy (HCC)    Proteinuria    Stroke (HCC)    Tobacco abuse 10/04/2014   Vitamin B 12 deficiency    Vitamin D deficiency    Wears dentures    full upper and lower    Past Surgical History:  Procedure Laterality Date   CATARACT EXTRACTION W/PHACO Right 09/29/2023   Procedure: PHACOEMULSIFICATION, CATARACT, WITH IOL INSERTION 5.23 00:32.6;  Surgeon: Annell Kidney, MD;  Location: [facility];  Service: Ophthalmology;  Laterality: Right;   COLON SURGERY     COLONOSCOPY N/A 01/09/2021   Procedure: COLONOSCOPY;  Surgeon: Quintin Buckle, DO;  Location: [facility] ENDOSCOPY;  Service: Gastroenterology;  Laterality: N/A;   COLONOSCOPY WITH PROPOFOL  N/A 11/23/2018   Procedure: COLONOSCOPY WITH PROPOFOL ;  Surgeon: Toledo, Alphonsus Jeans, MD;  Location: [facility] ENDOSCOPY;  Service: Gastroenterology;  Laterality: N/A;   ESOPHAGOGASTRODUODENOSCOPY N/A  01/09/2021   Procedure: ESOPHAGOGASTRODUODENOSCOPY (EGD);  Surgeon: Quintin Buckle, DO;  Location: [facility] ENDOSCOPY;  Service: Gastroenterology;  Laterality: N/A;   ESOPHAGOGASTRODUODENOSCOPY N/A 07/02/2022   Procedure: ESOPHAGOGASTRODUODENOSCOPY (EGD);  Surgeon: Quintin Buckle, DO;  Location: [facility] ENDOSCOPY;  Service: Gastroenterology;  Laterality: N/A;   ESOPHAGOGASTRODUODENOSCOPY (EGD) WITH PROPOFOL  N/A 11/23/2018   Procedure: ESOPHAGOGASTRODUODENOSCOPY (EGD) WITH PROPOFOL ;  Surgeon: Toledo, Alphonsus Jeans, MD;  Location: [facility] ENDOSCOPY;  Service: Gastroenterology;  Laterality: N/A;   ESOPHAGOGASTRODUODENOSCOPY (EGD) WITH PROPOFOL  N/A 01/17/2021   Procedure: ESOPHAGOGASTRODUODENOSCOPY (EGD) WITH PROPOFOL ;  Surgeon: Selena Daily, MD;  Location: [facility] ENDOSCOPY;  Service: Gastroenterology;  Laterality: N/A;   HERNIA REPAIR     PARTIAL COLECTOMY     TONSILLECTOMY     VENTRAL HERNIA REPAIR      Prior to Admission medications   Medication Sig Start Date End Date Taking? Authorizing Provider  albuterol  (VENTOLIN  HFA) 108 (90 Base) MCG/ACT inhaler SMARTSIG:2 Puff(s) By Mouth Every 6 Hours PRN   Yes [provider]  budesonide (PULMICORT) 0.5 MG/2ML nebulizer solution Take 0.5 mg by nebulization 2 (two) times daily.   Yes [provider]  Cholecalciferol (VITAMIN D3) 5000 units TABS Take 5,000 Units by mouth daily.    Yes [provider]  Cyanocobalamin  (VITAMIN B-12 PO) Take by mouth daily.   Yes [provider]  fluticasone  (FLONASE ) 50 MCG/ACT nasal spray Place 2 sprays into the nose daily. 03/05/15  Yes [provider]  gabapentin (NEURONTIN) 100 MG capsule Take 100 mg by mouth at bedtime as needed.   Yes [provider]  glipiZIDE (GLUCOTROL) 10 MG tablet Take 20 mg by mouth 2 (two) times daily. 02/28/21  Yes [provider]  hydrochlorothiazide  (HYDRODIURIL ) 25 MG tablet Hold until followup with outpatient doctor due to  intermittent low blood pressure. 01/17/21  Yes Garrison Kanner, MD  lisinopril  (ZESTRIL ) 40 MG tablet Hold until followup with outpatient doctor due to intermittent low blood pressure. 01/17/21  Yes Garrison Kanner, MD  lovastatin (MEVACOR) 40 MG tablet Take 2 tablets by mouth daily. 03/05/15  Yes [provider]  MAGNESIUM  PO Take 1 tablet by mouth at bedtime.   Yes [provider]  metFORMIN (GLUCOPHAGE) 1000 MG tablet Take 1,000 mg by mouth 2 (two) times daily. 03/05/15  Yes [provider]  omeprazole (PRILOSEC) 20 MG capsule Take 20 mg by mouth 2 (two) times daily. 03/05/15  Yes [provider]  TRESIBA FLEXTOUCH 200 UNIT/ML FlexTouch Pen Inject 54 Units into the skin daily. 09/17/22  Yes [provider]  vitamin C (ASCORBIC ACID) 250 MG tablet Take 250 mg by mouth daily.   Yes [provider]  BD PEN NEEDLE NANO 2ND GEN 32G X 4 MM MISC USE 1 EACH ONCE DAILY 03/25/23   [provider]  Insulin  Glargine, 2 Unit Dial, (TOUJEO MAX SOLOSTAR) 300 UNIT/ML SOPN Inject 300 Units/mL into the skin at bedtime. Patient not taking: Reported on 01/12/2023    [provider]  torsemide (DEMADEX) 20 MG tablet Take by mouth. Patient not taking: Reported on 09/27/2023 02/08/23   [provider]    Allergies as of 08/13/2023 - Review Complete 06/08/2023  Allergen Reaction Noted   Molds & smuts Shortness Of Breath 09/29/2013   Other Shortness Of Breath and Hives 11/04/2015   Penicillins Hives and Swelling 11/04/2015   Pioglitazone Other (See Comments) and Palpitations 12/05/2015   Byetta 10 mcg pen [exenatide] Other (See Comments) 11/04/2015   Dapagliflozin Other (See Comments) 11/03/2016   Jardiance [empagliflozin] Other (See Comments) 11/22/2018   Victoza [liraglutide] Other (See Comments) 11/04/2015   Sulfa antibiotics Rash 11/04/2015   Sulfasalazine Rash 11/22/2018    Family History  Problem Relation Age of Onset   Diabetes Mother     Diabetes Father    Diabetes Sister    Diabetes Maternal Grandmother    Diabetes Paternal Grandmother     Social History   Socioeconomic History   Marital status: Widowed    Spouse name: Not on file   Number of children: Not on file   Years of education: Not on file   Highest education level: Not on file  Occupational History   Not on file  Tobacco Use   Smoking status: Former    Current packs/day: 0.00    Average packs/day: 1 pack/day for 52.0 years (52.0 ttl pk-yrs)    Types: Cigarettes    Start date: 06/12/1966    Quit date: 06/12/2018    Years since quitting: 5.3   Smokeless tobacco: Never  Vaping Use   Vaping status: Never Used  Substance and Sexual Activity   Alcohol use: No    Alcohol/week: 0.0 standard drinks of alcohol   Drug use: Not Currently   Sexual activity: Not on file  Other Topics Concern   Not on file  Social History Narrative   Not on file   Social Drivers of Health   Financial Resource Strain: Low Risk  (  07/26/2023)   Received from [facility]   Overall Financial Resource Strain (CARDIA)    Difficulty of Paying Living Expenses: Not hard at all  Food Insecurity: No Food Insecurity (07/26/2023)   Received from [facility]   Hunger Vital Sign    Worried About Running Out of Food in the Last Year: Never true    Ran Out of Food in the Last Year: Never true  Transportation Needs: No Transportation Needs (07/26/2023)   Received from [facility]    In the past 12 months, has lack of transportation kept you from medical appointments or from getting medications?: No    Lack of Transportation (Non-Medical): No  Physical Activity: Not on file  Stress: Not on file  Social Connections: Not on file  Intimate Partner Violence: Not on file    Review of Systems: See HPI, otherwise negative ROS  Physical Exam: BP (!) 146/58   Pulse 73   Temp (!) 97.5 F (36.4 C) (Temporal)    Wt 75.7 kg   SpO2 93%   BMI 33.71 kg/m  General:   Alert,  pleasant and cooperative in NAD Head:  Normocephalic and atraumatic. Lungs:  Clear to auscultation.    Heart:  Regular rate and rhythm.   Impression/Plan: Brenda Beasley is here for ophthalmic surgery.  Risks, benefits, limitations, and alternatives regarding ophthalmic surgery have been reviewed with the patient.  Questions have been answered.  All parties agreeable.   Annell Kidney, MD  10/13/2023, 7:29 AM

## 2023-10-13 NOTE — Op Note (Signed)
 OPERATIVE NOTE  Brenda Beasley [id number] 10/13/2023   PREOPERATIVE DIAGNOSIS:  Nuclear sclerotic cataract left eye. H25.12   POSTOPERATIVE DIAGNOSIS:    Nuclear sclerotic cataract left eye.     PROCEDURE:  Phacoemusification with posterior chamber intraocular lens placement of the left eye  Ultrasound time: Procedure(s): PHACOEMULSIFICATION, CATARACT, WITH IOL INSERTION 4.80, 00:31.5 (Left)  LENS:   Implant Name Type Inv. Item Serial No. Manufacturer Lot No. LRB No. Used Action  LENS IOL TECNIS EYHANCE 23.5 - [id number] Intraocular Lens LENS IOL TECNIS EYHANCE 23.5 [id number] [employer]  Left 1 Implanted      SURGEON:  Berline Brenner, MD   ANESTHESIA:  Topical with tetracaine  drops and 2% Xylocaine  jelly, augmented with 1% preservative-free intracameral lidocaine .    COMPLICATIONS:  None.   DESCRIPTION OF PROCEDURE:  The patient was identified in the holding room and transported to the operating room and placed in the supine position under the operating microscope.  The left eye was identified as the operative eye and it was prepped and draped in the usual sterile ophthalmic fashion.   A 1 millimeter clear-corneal paracentesis was made at the 1:30 position.  0.5 ml of preservative-free 1% lidocaine  was injected into the anterior chamber.  The anterior chamber was filled with Viscoat viscoelastic.  A 2.4 millimeter keratome was used to make a near-clear corneal incision at the 10:30 position.  .  A curvilinear capsulorrhexis was made with a cystotome and capsulorrhexis forceps.  Balanced salt  solution was used to hydrodissect and hydrodelineate the nucleus.   Phacoemulsification was then used in stop and chop fashion to remove the lens nucleus and epinucleus.  The remaining cortex was then removed using the irrigation and aspiration handpiece. Provisc was then placed into the capsular bag to distend it for lens placement.  A lens was then injected into the capsular bag.  The  remaining viscoelastic was aspirated.   Wounds were hydrated with balanced salt  solution.  The anterior chamber was inflated to a physiologic pressure with balanced salt  solution.  No wound leaks were noted. Vigamox  0.2 ml of a 1mg  per ml solution was injected into the anterior chamber for a dose of 0.2 mg of intracameral antibiotic at the completion of the case.   Timolol  and Brimonidine  drops were applied to the eye.  The patient was taken to the recovery room in stable condition without complications of anesthesia or surgery.  [name] 10/13/2023, 7:53 AM

## 2023-10-13 NOTE — Anesthesia Postprocedure Evaluation (Signed)
 Anesthesia Post Note  Patient: Brenda Beasley  Procedure(s) Performed: PHACOEMULSIFICATION, CATARACT, WITH IOL INSERTION 4.80, 00:31.5 (Left)  Patient location during evaluation: PACU Anesthesia Type: MAC Level of consciousness: awake and alert Pain management: pain level controlled Vital Signs Assessment: post-procedure vital signs reviewed and stable Respiratory status: spontaneous breathing, nonlabored ventilation, respiratory function stable and patient connected to nasal cannula oxygen Cardiovascular status: stable and blood pressure returned to baseline Postop Assessment: no apparent nausea or vomiting Anesthetic complications: no   No notable events documented.   Last Vitals:  Vitals:   10/13/23 0755 10/13/23 0759  BP: (!) 135/48 134/70  Pulse: 73 72  Resp: 14 15  Temp: (!) 36.4 C (!) 36.4 C  SpO2: 96% 92%    Last Pain:  Vitals:   10/13/23 0759  TempSrc:   PainSc: 0-No pain                 Emilie Harden

## 2023-10-19 ENCOUNTER — Inpatient Hospital Stay: Payer: Medicare Other

## 2023-10-19 ENCOUNTER — Inpatient Hospital Stay: Payer: Medicare Other | Attending: Oncology | Admitting: Oncology

## 2023-10-19 ENCOUNTER — Encounter: Payer: Self-pay | Admitting: Oncology

## 2023-10-19 VITALS — BP 177/80 | HR 87 | Temp 97.4°F | Resp 18 | Wt 168.8 lb

## 2023-10-19 VITALS — BP 169/75 | HR 82 | Resp 18

## 2023-10-19 DIAGNOSIS — K7469 Other cirrhosis of liver: Secondary | ICD-10-CM | POA: Diagnosis not present

## 2023-10-19 DIAGNOSIS — K7460 Unspecified cirrhosis of liver: Secondary | ICD-10-CM | POA: Diagnosis not present

## 2023-10-19 DIAGNOSIS — D500 Iron deficiency anemia secondary to blood loss (chronic): Secondary | ICD-10-CM

## 2023-10-19 DIAGNOSIS — D508 Other iron deficiency anemias: Secondary | ICD-10-CM

## 2023-10-19 DIAGNOSIS — Z8051 Family history of malignant neoplasm of kidney: Secondary | ICD-10-CM | POA: Insufficient documentation

## 2023-10-19 DIAGNOSIS — Z801 Family history of malignant neoplasm of trachea, bronchus and lung: Secondary | ICD-10-CM | POA: Insufficient documentation

## 2023-10-19 DIAGNOSIS — Z800 Family history of malignant neoplasm of digestive organs: Secondary | ICD-10-CM | POA: Insufficient documentation

## 2023-10-19 DIAGNOSIS — D509 Iron deficiency anemia, unspecified: Secondary | ICD-10-CM | POA: Diagnosis present

## 2023-10-19 DIAGNOSIS — Z803 Family history of malignant neoplasm of breast: Secondary | ICD-10-CM | POA: Insufficient documentation

## 2023-10-19 DIAGNOSIS — D696 Thrombocytopenia, unspecified: Secondary | ICD-10-CM | POA: Diagnosis not present

## 2023-10-19 DIAGNOSIS — Z87891 Personal history of nicotine dependence: Secondary | ICD-10-CM | POA: Diagnosis not present

## 2023-10-19 MED ORDER — SODIUM CHLORIDE 0.9% FLUSH
10.0000 mL | Freq: Once | INTRAVENOUS | Status: AC | PRN
  Administered 2023-10-19: 10 mL
  Filled 2023-10-19: qty 10

## 2023-10-19 MED ORDER — IRON SUCROSE 20 MG/ML IV SOLN
200.0000 mg | Freq: Once | INTRAVENOUS | Status: AC
  Administered 2023-10-19: 200 mg via INTRAVENOUS
  Filled 2023-10-19: qty 10

## 2023-10-19 NOTE — Progress Notes (Signed)
 Hematology/Oncology progress note [phone]     CHIEF COMPLAINTS/REASON FOR VISIT:  iron  deficiency anemia   ASSESSMENT & PLAN:   IDA (iron  deficiency anemia) # Iron  deficiency anemia.  Labs reviewed and discussed with patient Lab Results  Component Value Date   HGB 9.7 (L) 10/12/2023   TIBC 512 (H) 10/12/2023   IRONPCTSAT 6 (L) 10/12/2023   FERRITIN 12 10/12/2023     Persistent anemia, decreased iron  saturation, consistent with iron  deficiency anemia. Recommend IV Venofer  weekly x 5 Suspect GI blood loss, I recommend patient to follow up with GI   Cirrhosis of liver (HCC) # Cirrhosis/ portal hypertension gastropathy.  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   Orders Placed This Encounter  Procedures   CBC with Differential (Cancer Center Only)    Standing Status:   Future    Expected Date:   02/18/2024    Expiration Date:   10/18/2024   Ferritin    Standing Status:   Future    Expected Date:   02/18/2024    Expiration Date:   10/18/2024   Iron  and TIBC    Standing Status:   Future    Expected Date:   02/18/2024    Expiration Date:   10/18/2024   Retic Panel    Standing Status:   Future    Expected Date:   02/18/2024    Expiration Date:   10/18/2024   Follow up in 4 months All questions were answered. The patient knows to call the clinic with any problems, questions or concerns.  Timmy Forbes, MD, PhD [facility] Hematology Oncology 10/19/2023   HISTORY OF PRESENTING ILLNESS:  Brenda Beasley is a  73 y.o.  female with PMH listed below was seen in consultation at the request of Melchor Spoon, MD   for evaluation of iron  deficiency anemia.   Reviewed patient's recent labs  6/21/202 Labs revealed anemia with hemoglobin of 9.1, MCV 76, normal total wbc and platlet Reviewed patient's previous labs ordered by primary care  physician's office, anemia is chronic onset , duration is since 2019. Basline above 10 No aggravating or improving factors.  Associated signs and symptoms: Patient reports fatigue.  Mild SOB with exertion.  Denies weight loss, easy bruising, hematochezia, hemoptysis, hematuria. Context:  History of iron  deficiency: long standing history of IDA. Takes oral iron  supplementation Rectal bleeding: deneis.  Menstrual bleeding/ Vaginal bleeding : denies Hematemesis or hemoptysis : denies Blood in urine : denies   History partial colectomy about 15 years due to large polyp. Per patient, pathology showed pre-cancer polyp.  Family history is positive for colon cancer in mother and breast cancer in sister.  Accompanied by her son.  She craves for ice chips.   admission from 01/16/2021 - 01/17/2021 due to reported hematemesis which was later felt to be episode of epistaxis. Patient has heme positive stool 01/09/2021 upper EGD and colonoscopy.  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  Small hiatal hernia and grade 1 esophageal varices.  INTERVAL HISTORY Brenda Beasley is a 73 y.o. female who has above history reviewed by me today presents for follow up visit for  iron  deficiency anemia.  Patient previously tolerates IV Venofer  treatments. Chronic fatigue, worse. No blood in stool or dark stool.      Review of Systems  Constitutional:  Positive for fatigue. Negative for appetite change, chills, fever and unexpected weight change.  HENT:   Negative for hearing loss and voice change.   Eyes:  Negative for eye problems.  Respiratory:  Negative for chest tightness, cough and shortness of breath.   Cardiovascular:  Negative for chest pain.  Gastrointestinal:  Positive for blood in stool. Negative for abdominal distention and abdominal pain.  Endocrine: Negative for hot flashes.  Genitourinary:  Negative for  difficulty urinating and frequency.   Musculoskeletal:  Positive for back pain. Negative for arthralgias.  Skin:  Negative for itching and rash.  Neurological:  Negative for extremity weakness.  Hematological:  Negative for adenopathy.  Psychiatric/Behavioral:  Negative for confusion.     MEDICAL HISTORY:  Past Medical History:  Diagnosis Date   Anxiety    Asthma    Chronic pruritus    Cirrhosis of liver (HCC)    COPD (chronic obstructive pulmonary disease) (HCC)    Diabetes mellitus without complication (HCC)    Family history of breast cancer    Family history of colon cancer    Family history of kidney cancer    Family history of lung cancer    GERD (gastroesophageal reflux disease)    H/O esophageal varices    Hamartomatous polyp of large intestine (HCC)    Hematuria, microscopic    History of uterine fibroid    History of ventral hernia    Hyperlipidemia    Hypertension    IBS (irritable bowel syndrome)    IDA (iron  deficiency anemia) 11/13/2020   Osteoporosis with current pathological fracture 11/03/2018   Personal history of tobacco use, presenting hazards to health 09/20/2015   Portal hypertensive gastropathy (HCC)    Proteinuria    Stroke (HCC)    Tobacco abuse 10/04/2014   Vitamin B 12 deficiency    Vitamin D deficiency    Wears dentures    full upper and lower    SURGICAL HISTORY: Past Surgical History:  Procedure Laterality Date   CATARACT EXTRACTION W/PHACO Right 09/29/2023   Procedure: PHACOEMULSIFICATION, CATARACT, WITH IOL INSERTION 5.23 00:32.6;  Surgeon: Annell Kidney, MD;  Location: [facility];  Service: Ophthalmology;  Laterality: Right;   CATARACT EXTRACTION W/PHACO Left 10/13/2023   Procedure: PHACOEMULSIFICATION, CATARACT, WITH IOL INSERTION 4.80, 00:31.5;  Surgeon: Annell Kidney, MD;  Location: [facility];  Service: Ophthalmology;  Laterality: Left;   COLON SURGERY     COLONOSCOPY N/A 01/09/2021   Procedure:  COLONOSCOPY;  Surgeon: Quintin Buckle, DO;  Location: [facility] ENDOSCOPY;  Service: Gastroenterology;  Laterality: N/A;   COLONOSCOPY WITH PROPOFOL  N/A 11/23/2018   Procedure: COLONOSCOPY WITH PROPOFOL ;  Surgeon: Toledo, Alphonsus Jeans, MD;  Location: [facility] ENDOSCOPY;  Service: Gastroenterology;  Laterality: N/A;   ESOPHAGOGASTRODUODENOSCOPY N/A 01/09/2021   Procedure: ESOPHAGOGASTRODUODENOSCOPY (EGD);  Surgeon: Quintin Buckle, DO;  Location: [facility] ENDOSCOPY;  Service: Gastroenterology;  Laterality: N/A;   ESOPHAGOGASTRODUODENOSCOPY N/A 07/02/2022   Procedure: ESOPHAGOGASTRODUODENOSCOPY (EGD);  Surgeon: Quintin Buckle, DO;  Location: [facility] ENDOSCOPY;  Service: Gastroenterology;  Laterality: N/A;   ESOPHAGOGASTRODUODENOSCOPY (EGD) WITH PROPOFOL  N/A 11/23/2018   Procedure: ESOPHAGOGASTRODUODENOSCOPY (EGD) WITH PROPOFOL ;  Surgeon: Toledo, Alphonsus Jeans, MD;  Location: [facility] ENDOSCOPY;  Service: Gastroenterology;  Laterality: N/A;   ESOPHAGOGASTRODUODENOSCOPY (EGD) WITH PROPOFOL  N/A 01/17/2021   Procedure: ESOPHAGOGASTRODUODENOSCOPY (EGD) WITH PROPOFOL ;  Surgeon: Selena Daily, MD;  Location: [facility] ENDOSCOPY;  Service: Gastroenterology;  Laterality: N/A;   HERNIA REPAIR     PARTIAL COLECTOMY     TONSILLECTOMY  VENTRAL HERNIA REPAIR      SOCIAL HISTORY: Social History   Socioeconomic History   Marital status: Widowed    Spouse name: Not on file   Number of children: Not on file   Years of education: Not on file   Highest education level: Not on file  Occupational History   Not on file  Tobacco Use   Smoking status: Former    Current packs/day: 0.00    Average packs/day: 1 pack/day for 52.0 years (52.0 ttl pk-yrs)    Types: Cigarettes    Start date: 06/12/1966    Quit date: 06/12/2018    Years since quitting: 5.3   Smokeless tobacco: Never  Vaping Use   Vaping status: Never Used  Substance and Sexual Activity   Alcohol use: No    Alcohol/week: 0.0 standard drinks of alcohol    Drug use: Not Currently   Sexual activity: Not on file  Other Topics Concern   Not on file  Social History Narrative   Not on file   Social Drivers of Health   Financial Resource Strain: Low Risk  (07/26/2023)   Received from [facility]   Overall Financial Resource Strain (CARDIA)    Difficulty of Paying Living Expenses: Not hard at all  Food Insecurity: No Food Insecurity (07/26/2023)   Received from [facility]   Hunger Vital Sign    Worried About Running Out of Food in the Last Year: Never true    Ran Out of Food in the Last Year: Never true  Transportation Needs: No Transportation Needs (07/26/2023)   Received from [facility] - Transportation    In the past 12 months, has lack of transportation kept you from medical appointments or from getting medications?: No    Lack of Transportation (Non-Medical): No  Physical Activity: Not on file  Stress: Not on file  Social Connections: Not on file  Intimate Partner Violence: Not on file    FAMILY HISTORY: Family History  Problem Relation Age of Onset   Diabetes Mother    Diabetes Father    Diabetes Sister    Diabetes Maternal Grandmother    Diabetes Paternal Grandmother     ALLERGIES:  is allergic to molds & smuts, other, penicillins, pioglitazone, byetta 10 mcg pen [exenatide], dapagliflozin, jardiance [empagliflozin], victoza [liraglutide], sulfa antibiotics, and sulfasalazine.  MEDICATIONS:  Current Outpatient Medications  Medication Sig Dispense Refill   albuterol  (VENTOLIN  HFA) 108 (90 Base) MCG/ACT inhaler SMARTSIG:2 Puff(s) By Mouth Every 6 Hours PRN     BD PEN NEEDLE NANO 2ND GEN 32G X 4 MM MISC USE 1 EACH ONCE DAILY     budesonide (PULMICORT) 0.5 MG/2ML nebulizer solution Take 0.5 mg by nebulization 2 (two) times daily.     Cholecalciferol (VITAMIN D3) 5000 units TABS Take 5,000 Units by mouth daily.      Cyanocobalamin  (VITAMIN B-12 PO) Take by mouth  daily.     fluticasone  (FLONASE ) 50 MCG/ACT nasal spray Place 2 sprays into the nose daily.     gabapentin (NEURONTIN) 100 MG capsule Take 100 mg by mouth at bedtime as needed.     glipiZIDE (GLUCOTROL) 10 MG tablet Take 20 mg by mouth 2 (two) times daily.     hydrochlorothiazide  (HYDRODIURIL ) 25 MG tablet Hold until followup with outpatient doctor due to intermittent low blood pressure.     lisinopril  (ZESTRIL ) 40 MG tablet Hold until followup with outpatient doctor due to intermittent low  blood pressure.     lovastatin (MEVACOR) 40 MG tablet Take 2 tablets by mouth daily.     MAGNESIUM  PO Take 1 tablet by mouth at bedtime.     metFORMIN (GLUCOPHAGE) 1000 MG tablet Take 1,000 mg by mouth 2 (two) times daily.     omeprazole (PRILOSEC) 20 MG capsule Take 20 mg by mouth 2 (two) times daily.     prednisoLONE sodium phosphate  (PEDIAPRED) 5 MG/5ML SOLN Take by mouth.     TRESIBA FLEXTOUCH 200 UNIT/ML FlexTouch Pen Inject 54 Units into the skin daily.     vitamin C (ASCORBIC ACID) 250 MG tablet Take 250 mg by mouth daily.     Insulin  Glargine, 2 Unit Dial, (TOUJEO MAX SOLOSTAR) 300 UNIT/ML SOPN Inject 300 Units/mL into the skin at bedtime. (Patient not taking: Reported on 01/12/2023)     torsemide (DEMADEX) 20 MG tablet Take by mouth. (Patient not taking: Reported on 09/27/2023)     No current facility-administered medications for this visit.     PHYSICAL EXAMINATION: ECOG PERFORMANCE STATUS: 1 - Symptomatic but completely ambulatory Vitals:   10/19/23 1311 10/19/23 1322  BP: (!) 177/74 (!) 177/80  Pulse: 87   Resp: 18   Temp: (!) 97.4 F (36.3 C)   SpO2: 95%    Filed Weights   10/19/23 1311  Weight: 168 lb 12.8 oz (76.6 kg)    Physical Exam Constitutional:      General: She is not in acute distress.    Appearance: She is obese.  HENT:     Head: Normocephalic and atraumatic.  Eyes:     General: No scleral icterus. Cardiovascular:     Rate and Rhythm: Normal rate.  Pulmonary:      Effort: Pulmonary effort is normal. No respiratory distress.  Abdominal:     General: Bowel sounds are normal.     Palpations: Abdomen is soft.  Musculoskeletal:        General: No deformity. Normal range of motion.     Cervical back: Normal range of motion and neck supple.  Skin:    General: Skin is warm and dry.     Findings: No rash.  Neurological:     Mental Status: She is alert and oriented to person, place, and time. Mental status is at baseline.  Psychiatric:        Mood and Affect: Mood normal.      Laboratory studies I have reviewed the data as listed    Latest Ref Rng & Units 10/12/2023   12:52 PM  CBC  WBC 4.0 - 10.5 K/uL 4.9   Hemoglobin 12.0 - 15.0 g/dL 9.7   Hematocrit 16.1 - 46.0 % 31.8   Platelets 150 - 400 K/uL 102        Latest Ref Rng & Units 04/12/2023    1:04 PM 07/06/2022   11:03 AM 01/16/2021    1:23 PM  CMP  Glucose 70 - 99 mg/dL 096  045  90   BUN 8 - 23 mg/dL 19  19  15    Creatinine 0.44 - 1.00 mg/dL 4.09  8.11  9.14   Sodium 135 - 145 mmol/L 139  134  139   Potassium 3.5 - 5.1 mmol/L 3.8  3.7  3.6   Chloride 98 - 111 mmol/L 99  97  102   CO2 22 - 32 mmol/L 24  26  26    Calcium 8.9 - 10.3 mg/dL 8.8  8.8  9.4   Total Protein 6.5 -  8.1 g/dL 7.0  6.9  7.7   Total Bilirubin <1.2 mg/dL 0.4  0.6  0.6   Alkaline Phos 38 - 126 U/L 58  53  66   AST 15 - 41 U/L 20  21  26    ALT 0 - 44 U/L 13  17  16      Iron /TIBC/Ferritin/ %Sat    Component Value Date/Time   IRON  32 10/12/2023 1252   TIBC 512 (H) 10/12/2023 1252   FERRITIN 12 10/12/2023 1252   IRONPCTSAT 6 (L) 10/12/2023 1252     RADIOGRAPHIC STUDIES: I have personally reviewed the radiological images as listed and agreed with the findings in the report. No results found.

## 2023-10-19 NOTE — Assessment & Plan Note (Signed)
#   Cirrhosis/ portal hypertension gastropathy.  Recommend patient to  follow-up with gastroenterology

## 2023-10-19 NOTE — Assessment & Plan Note (Addendum)
#   Iron  deficiency anemia.  Labs reviewed and discussed with patient Lab Results  Component Value Date   HGB 9.7 (L) 10/12/2023   TIBC 512 (H) 10/12/2023   IRONPCTSAT 6 (L) 10/12/2023   FERRITIN 12 10/12/2023     Persistent anemia, decreased iron  saturation, consistent with iron  deficiency anemia. Recommend IV Venofer  weekly x 5 Suspect GI blood loss, I recommend patient to follow up with GI

## 2023-10-19 NOTE — Assessment & Plan Note (Signed)
 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

## 2023-10-26 ENCOUNTER — Inpatient Hospital Stay

## 2023-10-26 VITALS — BP 152/56 | HR 74 | Temp 97.1°F | Resp 16

## 2023-10-26 DIAGNOSIS — D509 Iron deficiency anemia, unspecified: Secondary | ICD-10-CM | POA: Diagnosis not present

## 2023-10-26 DIAGNOSIS — D508 Other iron deficiency anemias: Secondary | ICD-10-CM

## 2023-10-26 MED ORDER — IRON SUCROSE 20 MG/ML IV SOLN
200.0000 mg | Freq: Once | INTRAVENOUS | Status: AC
  Administered 2023-10-26: 200 mg via INTRAVENOUS
  Filled 2023-10-26: qty 10

## 2023-10-26 NOTE — Progress Notes (Signed)
 Declined post-observation. Aware of risks. Vitals stable at discharge.

## 2023-10-26 NOTE — Patient Instructions (Signed)

## 2023-11-02 ENCOUNTER — Inpatient Hospital Stay

## 2023-11-05 ENCOUNTER — Inpatient Hospital Stay

## 2023-11-05 VITALS — BP 112/66 | HR 63 | Temp 98.0°F | Resp 16

## 2023-11-05 DIAGNOSIS — D509 Iron deficiency anemia, unspecified: Secondary | ICD-10-CM | POA: Diagnosis not present

## 2023-11-05 DIAGNOSIS — D508 Other iron deficiency anemias: Secondary | ICD-10-CM

## 2023-11-05 MED ORDER — IRON SUCROSE 20 MG/ML IV SOLN
200.0000 mg | Freq: Once | INTRAVENOUS | Status: AC
  Administered 2023-11-05: 200 mg via INTRAVENOUS

## 2023-11-05 NOTE — Patient Instructions (Signed)

## 2023-11-09 ENCOUNTER — Inpatient Hospital Stay

## 2023-11-09 VITALS — BP 168/63 | HR 82 | Temp 96.0°F

## 2023-11-09 DIAGNOSIS — D509 Iron deficiency anemia, unspecified: Secondary | ICD-10-CM | POA: Diagnosis not present

## 2023-11-09 DIAGNOSIS — D508 Other iron deficiency anemias: Secondary | ICD-10-CM

## 2023-11-09 MED ORDER — IRON SUCROSE 20 MG/ML IV SOLN
200.0000 mg | Freq: Once | INTRAVENOUS | Status: AC
  Administered 2023-11-09: 200 mg via INTRAVENOUS

## 2023-11-09 MED ORDER — SODIUM CHLORIDE 0.9% FLUSH
10.0000 mL | Freq: Once | INTRAVENOUS | Status: AC | PRN
  Administered 2023-11-09: 10 mL
  Filled 2023-11-09: qty 10

## 2023-11-11 NOTE — Progress Notes (Addendum)
 HPI:  Brenda Beasley is a 73 y.o. female who presents for follow up diabetes, hypertension, hyperlipidemia.  I last saw her in 12/24.  At that time, I decreased her Tresiba.  She cut it again more due to her low blood sugars.  She currently takes Tresiba 52 units every morning, metformin 1000 mg twice a day and glipizide 10 mg bid.  She tried Januvia, Tradjenta and Jardiance in the past and could not tolerate them.  She also tried Bydureon, Victoza, Farxiga, and Actos and had trouble with those.   She tests her fasting 1-3 mostly 1.  She brought her log book and I reviewed it.  Her sugars are occasionally below 100s.  Review of her diet shows that she tries to avoid concentrated carbohydrates.  She walks some for exercise.  She takes gabapentin only when the pain is too much.  Her weight is down 7 pounds from last visit.  She is concerned about her diabetes   ROS:  No chest pain.  No shortness of breath.  Medical History: Past Medical History:  Diagnosis Date  . Age-related osteoporosis without current pathological fracture 11/13/2016   By DEXA 6/18  . B12 deficiency   . Chronic pruritus   . COPD (chronic obstructive pulmonary disease) (CMS/HHS-HCC)   . Diabetes mellitus type 2, uncomplicated (CMS/HHS-HCC)    adult onset  . Drug-induced urticaria    after anesthesia  . GERD (gastroesophageal reflux disease)    EGD 2/04 with esophagitis, Schatzki ring (dilated), gastritis, duodenitis.  [name] History of uterine fibroid   . Hyperlipidemia   . Hypertension   . IBS (irritable bowel syndrome)    per patient report  . Microscopic hematuria    by urinalysis December 2003.  Cystoscopy revealing cystitis cystica, on suppressive antibiotics.  . Other cirrhosis of liver (CMS/HHS-HCC) 06/30/2018   Noted on CT scan 2/20: Fatty liver disease evident, likely the source  . Proteinuria 03/05/2015  . Stroke (CMS/HHS-HCC)    Patient said she was told she had mini strokes in the past  . Ventral hernia     by CT 8/13; no bowel seen within this hernia.  . Vitamin D deficiency     Surgical History: Past Surgical History:  Procedure Laterality Date  . Colon polyps  2008   Colonoscopy 2008 revealing large polyps requiring partial colectomy through Dr. Unknown Sharps, 2008  . LAPAROSCOPIC COLECTOMY PARTIAL W/ANASTAMOSIS Right 2008   Dr. Sharps  . COLONOSCOPY  12/29/2006   09/19/1997, 07/05/2002. Adenomatous polyps.  [name] HERNIA REPAIR  2011   [facility], Dr. Unknown Sharps  . REPAIR INCISIONAL/VENTRAL HERNIA  11/2013   Dr. Sharps  . COLONOSCOPY  11/23/2018   Negative colon biopsies/PHx CP/Repeat 46yrs/TKT  . EGD  11/23/2018   Esophageal stenosis/Portal hypertensive gastropathy/Repeat 31yrs/TKT  . COLONOSCOPY  01/09/2021   Hyperplastic polyp/Proctitis/PHx CP/Will decided if repeat needed at Office visit/SMR  . EGD  01/09/2021   Gastric polyp/Will decided if repeat needed at Office visit/SMR  . EGD  01/17/2021   [place]  . EGD @ [facility]  07/02/2022   Esophageal varices/Harmartomtous polyp/Repeat 44yrs/SMR  . EGD  089/13/2008   06/22/1997, 07/05/2002, 07/23/2005. No repeat per Dr. Viktoria.  . TONSILLECTOMY      Social History:  reports that she quit smoking about 5 years ago. Her smoking use included cigarettes. She started smoking about 56 years ago. She has a 51 pack-year smoking history. She has never used smokeless tobacco. Drug use questions deferred to the physician. She  reports that she does not drink alcohol.  Family History: family history includes COPD in her mother; Diabetes in her father, maternal grandmother, mother, paternal grandmother, and sister; Goiter in her maternal grandfather; Heart disease in her father, mother, paternal grandfather, and paternal grandmother; High blood pressure (Hypertension) in her father and mother; Lung disease in her father and mother; Myocardial Infarction (Heart attack) in her father.  Medications: Current Outpatient Medications  Medication Sig Dispense  Refill  . albuterol  MDI, PROVENTIL , VENTOLIN , PROAIR , HFA 90 mcg/actuation inhaler Inhale 2 inhalations into the lungs every 6 (six) hours as needed for Wheezing 54 g 2  . amLODIPine (NORVASC) 2.5 MG tablet TAKE 1 TABLET BY MOUTH EVERY DAY 90 tablet 3  . ascorbic acid, vitamin C, (VITAMIN C) 500 MG tablet Take 1,000 mg by mouth 2 (two) times daily    . blood glucose diagnostic (ACCU-CHEK AVIVA PLUS TEST STRP) test strip TEST THREE TIMES DAILY AS DIRECTED. 300 strip 3  . budesonide (PULMICORT) 0.5 mg/2 mL nebulizer solution Take 2 mLs (0.5 mg total) by nebulization 2 (two) times daily Diagnosis code. 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 cholecalciferol (VITAMIN D3) 5,000 unit capsule Take by mouth 2 (two) times daily    . fluticasone  propionate (FLONASE ) 50 mcg/actuation nasal spray SPRAY 2 SPRAYS INTO EACH NOSTRIL EVERY DAY 48 g 1  . gabapentin (NEURONTIN) 100 MG capsule Take 100 mg by mouth as needed    . glipiZIDE (GLUCOTROL) 10 MG tablet Take 1 tablet (10 mg total) by mouth 2 (two) times daily before meals    . guaiFENesin (MUCINEX) 600 mg SR tablet Take 1 tablet (600 mg total) by mouth every 12 (twelve) hours as needed for Cough or Congestion 20 tablet 3  . hydroCHLOROthiazide  (HYDRODIURIL ) 25 MG tablet Take 12.5 mg by mouth once daily    . insulin  DEGLUDEC (TRESIBA FLEXTOUCH) pen injector (concentration 200 units/mL) Inject 70 Units subcutaneously once daily 54 mL 2  . ipratropium-albuteroL  (DUO-NEB) nebulizer solution Inhale into the lungs    . lisinopriL  (ZESTRIL ) 40 MG tablet TAKE 1/2 TABLETS BY MOUTH ONCE DAILY. 45 tablet 3  . lovastatin (MEVACOR) 40 MG tablet TAKE 2 TABLETS (80 MG TOTAL) BY MOUTH NIGHTLY 180 tablet 3  . magnesium  oxide (MAG-OX) 400 mg (241.3 mg magnesium ) tablet Take 500 mg by mouth 2 (two) times daily    . metFORMIN (GLUCOPHAGE) 1000 MG tablet TAKE 1 TABLET BY MOUTH TWICE A DAY WITH MEALS 180 tablet 4  . omeprazole (PRILOSEC) 20 MG DR capsule TAKE 1 CAPSULE BY MOUTH  TWICE A DAY BEFORE MEALS 180 capsule 3  . pen needle, diabetic (BD NANO 2ND GEN PEN NEEDLE) 32 gauge x 5/32 Ndle USE 1 EACH ONCE DAILY 100 each 3  . TORsemide (DEMADEX) 20 MG tablet Take 1 tablet (20 mg total) by mouth once daily for 10 days 10 tablet 0  . triamcinolone 0.1 % cream Apply topically 2 (two) times daily as needed 28.4 g 5   Current Facility-Administered Medications  Medication Dose Route Frequency Provider Last Rate Last Admin  . cyanocobalamin  (VITAMIN B12) injection 1,000 mcg  1,000 mcg Intramuscular Q30 Days Klein, Bert Jack III, MD   1,000 mcg at 05/03/20 1115    Allergies: Allergies  Allergen Reactions  . Mold Extracts Shortness Of Breath  . Other Hives and Shortness Of Breath    Cats hives Dust causes sneezing Affects breathing Tape-unknown Cats hives Dust causes sneezing Affects breathing Tape-unknown    .  Penicillins Hives and Swelling    Throat swell Throat swell    . Pioglitazone Palpitations and Other (See Comments)    Chest pain, numbness in fingers,  Chest pain, numbness in fingers,     . Exenatide Other (See Comments)    Severe stomach pains Severe stomach pains    . Farxiga [Dapagliflozin] Other (See Comments)    Intolerant/dyspnea  . Jardiance [Empagliflozin] Other (See Comments)    Has trouble urinating with this  . Liraglutide Other (See Comments)    Severe stomach pains    . Sulfa (Sulfonamide Antibiotics) Rash  . Sulfasalazine Rash    Physical Exam: Vitals:   11/11/23 1351  BP: 120/70  Pulse: 73  SpO2: 97%  Weight: 75.3 kg (166 lb)  Height: 152.4 cm (5')    Body mass index is 32.42 kg/m. Gen:  WDWN in NAD    Physical exam otherwise deferred due to coronavirus precautions.    Labs: 04/06/16: A1c = 7.4 06/30/16: A1c = 8.2 09/30/16: A1c = 7.8 01/27/17: A1c = 8.1.  K/Cr/Ca = 4.5/0.7/9.5.  Spot microalbumin = 194.  Cholesterol = 158/287/37.8/63.  LFTs normal.  Vitamin D = 60.6.  B12 = 630.  TSH = 2.375. 05/06/17: A1c =  7.9.  K/Cr/Ca = 4.0/0.8/9.5.  Spot microalbumin = 408.  Cholesterol = 145/256/36.4/57.  LFTs normal. 11/04/2017: A1c = 8.2 12/07/2017: K/Cr/Ca = 4.4/0.9/9.5.  Cholesterol = 169/270/44.1/71.  LFTs normal.  TSH = 1.904.  B12 = 706.  Vitamin D = 66.1 03/09/2018: A1c = 7.0 04/25/2018: K/Cr/Ca = 4.6/0.9/9.6.  Spot microalbumin = 353.  Cholesterol = 149/199/46.5/63.  LFTs normal.  TSH = 2.411 06/22/2018: A1c = 8.5. 08/23/2018: A1c=9.3.  K/Cr/Ca = 4.3/0.9/9.0. Cholesterol = 154/292/40.2/55. B12 = 674. Vitamin D = 76.5. TSH = 2.994 02/28/2019: A1c = 9.6.  K/Cr/Ca = 4.8/0.9/9.4.  MA = 252.3.  Chol. = 149/340/35/46.  LFTs nl.  TSH = 2.439.  D = 55.7.    07/19/2019: A1c = 8.4.  K/Cr/Ca =4.6/0.9/9.2. 10/31/2019: A1c = 7.5.  K/Cr/Ca = 4.4/0.8/9.0.  Chol. = 136/229/35.8/54.  LFTs nl.  TSH = 3.812.  D = 49.2.  B12 = 1123.  04/03/2020: A1c = 7.2.   04/30/2020: A1c = 6.7.  K/Cr/Ca = 4.2/0.9/9.4.  MA = 226.6.  Chol. = 130/218/35/51.  LFTs nl.  TSH = 3.784.   08/01/2020: A1c = 6.8 01/16/2021: A1c = 6.2 05/14/2021:  A1c = 6.4.  K/Cr/Ca = 3.7/0.9/9.1.  MA = 545.5. Chol = 135/194/35.4/61.  LFTs nl.  TSH = 2.924.   09/08/2021:  A1c =  7.0.  K/Cr/Ca = 4.3/0.8/9.3.  MA = 482.1.  Chol = 116/152/40.4/45.  LFTs nl.  TSH = 3.833.  04/17/2022:  A1c = 6.4.  MA = 354.4.  Chol = 116/174/36.8/44.  TSH = 2.699.  B12 = 905.   07/17/2022:  A1c = 6.4.  K/Cr/Ca = 3.8/1.0/8.9.  MA = 533.1.  Chol = 123/151/39.4/53.   LFTs nl.  TSH = 3.213.  D = 72.6.  11/04/2022:  A1c = 6.5  01/19/2023: A1c = 7.1.  K/Cr/Ca = 4.2/0.9/8.9.  Microalbumin = 838.8.  Cholesterol = 133/151/39.8/63.  LFTs normal.  TSH = 2.881. 05/13/23:  A1c=6.0  07/19/2023: A1c = 5.9. K/Cr/Ca = 4.0/1.0/9.2. LFTs nl.  Microalbumin = 831.  Chol = 125/130/41.1/58.  TSH =  3.396.  B12 = >1,500.  D = 80.3.   11/11/2023: A1c = 5.5.   Assessment/Plan: 1.  Diabetes.  Her A1c today is 5.5.  Based on her A1c, I  will cut her tresiba 48 units daily to reduce low sugars.  I told her if her sugars  go high, she can increase it to 50 units.  I encouraged lifestyle modifications.   2.  Hypertension/microalbuminuria associated with diabetes.  Blood pressure is good today on multiple agents including lisinopril .  MA elevated in 9/24.   3.  Leg pain/Cramps/Burning.  She was previously started on gabapentin by her pcp as she was having so much pain/cramping in her legs and arms.  She only takes it when the pain is too much since it causes her to be sleepy.   4.  Hyperlipidemia.  Lipids were good in 3/25 on 40 mg of lovastatin daily.   5.  Tobacco abuse.  She quit smoking in 1/20.  I encouraged her to remain off.   6.  Foot care.  She can not really reach her toes and her son has a hard time cutting her nails without cutting them too short and hurting her.  Two visits ago, I referred her to podiatry for routine diabetic foot care.  She saw them once.  He says they do not do long-term footcare without problems and recommended she get a pedicure next time she needs her nails cut.     7.  Prophylaxis.  She saw the eye doctor ([facility]) on 08/05/23.  She had no retinopathy.  She has a f/u in 3/26.    8.  She will return to clinic in 6 months.  04/17/24:  Pt called.  BG high.  Given pred 20 bid at walk in on 11/29.  Told to double glip while still on pred and limit carb and push fluids.  This note is partially prepared by Earla Daria Messier, Scribe, in the presence of and acting as the scribe of Dr. Debby Breaker , MD.     [facility], MD

## 2023-11-16 ENCOUNTER — Inpatient Hospital Stay: Attending: Oncology

## 2023-11-16 VITALS — BP 155/65 | HR 78 | Temp 97.2°F | Resp 18

## 2023-11-16 DIAGNOSIS — D509 Iron deficiency anemia, unspecified: Secondary | ICD-10-CM | POA: Insufficient documentation

## 2023-11-16 DIAGNOSIS — D508 Other iron deficiency anemias: Secondary | ICD-10-CM

## 2023-11-16 MED ORDER — IRON SUCROSE 20 MG/ML IV SOLN
200.0000 mg | Freq: Once | INTRAVENOUS | Status: AC
  Administered 2023-11-16: 200 mg via INTRAVENOUS

## 2023-11-16 MED ORDER — SODIUM CHLORIDE 0.9% FLUSH
10.0000 mL | Freq: Once | INTRAVENOUS | Status: AC | PRN
  Administered 2023-11-16: 10 mL
  Filled 2023-11-16: qty 10

## 2023-11-16 NOTE — Progress Notes (Signed)
 Patient declined to wait the 30 minutes for post iron infusion observation today. Tolerated infusion well. VSS.

## 2023-11-23 ENCOUNTER — Other Ambulatory Visit: Payer: Self-pay | Admitting: Gastroenterology

## 2023-11-23 DIAGNOSIS — K7460 Unspecified cirrhosis of liver: Secondary | ICD-10-CM

## 2023-11-23 DIAGNOSIS — I8510 Secondary esophageal varices without bleeding: Secondary | ICD-10-CM

## 2023-11-29 ENCOUNTER — Ambulatory Visit: Admission: RE | Admit: 2023-11-29 | Source: Ambulatory Visit

## 2023-12-28 ENCOUNTER — Ambulatory Visit
Admission: RE | Admit: 2023-12-28 | Discharge: 2023-12-28 | Disposition: A | Discharge status: Home / Self Care | Source: Ambulatory Visit | Attending: Gastroenterology | Admitting: Gastroenterology

## 2023-12-28 DIAGNOSIS — K7460 Unspecified cirrhosis of liver: Secondary | ICD-10-CM | POA: Diagnosis present

## 2023-12-28 DIAGNOSIS — I8510 Secondary esophageal varices without bleeding: Secondary | ICD-10-CM | POA: Diagnosis present

## 2023-12-29 ENCOUNTER — Other Ambulatory Visit: Payer: Self-pay | Admitting: Pulmonary Disease

## 2023-12-29 DIAGNOSIS — R918 Other nonspecific abnormal finding of lung field: Secondary | ICD-10-CM

## 2023-12-29 DIAGNOSIS — J449 Chronic obstructive pulmonary disease, unspecified: Secondary | ICD-10-CM

## 2024-01-27 ENCOUNTER — Ambulatory Visit: Admission: RE | Admit: 2024-01-27 | Source: Ambulatory Visit

## 2024-02-04 ENCOUNTER — Ambulatory Visit
Admission: RE | Admit: 2024-02-04 | Discharge: 2024-02-04 | Disposition: A | Discharge status: Home / Self Care | Source: Ambulatory Visit | Attending: Pulmonary Disease | Admitting: Pulmonary Disease

## 2024-02-04 DIAGNOSIS — J449 Chronic obstructive pulmonary disease, unspecified: Secondary | ICD-10-CM | POA: Insufficient documentation

## 2024-02-04 DIAGNOSIS — R918 Other nonspecific abnormal finding of lung field: Secondary | ICD-10-CM | POA: Diagnosis present

## 2024-02-15 ENCOUNTER — Inpatient Hospital Stay: Attending: Oncology

## 2024-02-15 DIAGNOSIS — D696 Thrombocytopenia, unspecified: Secondary | ICD-10-CM | POA: Insufficient documentation

## 2024-02-15 DIAGNOSIS — D509 Iron deficiency anemia, unspecified: Secondary | ICD-10-CM | POA: Diagnosis present

## 2024-02-15 DIAGNOSIS — D500 Iron deficiency anemia secondary to blood loss (chronic): Secondary | ICD-10-CM

## 2024-02-15 DIAGNOSIS — K7460 Unspecified cirrhosis of liver: Secondary | ICD-10-CM | POA: Diagnosis not present

## 2024-02-15 LAB — IRON AND TIBC
Iron: 44 ug/dL (ref 28–170)
Saturation Ratios: 11 % (ref 10.4–31.8)
TIBC: 392 ug/dL (ref 250–450)
UIBC: 348 ug/dL

## 2024-02-15 LAB — CBC WITH DIFFERENTIAL (CANCER CENTER ONLY)
Abs Immature Granulocytes: 0.05 K/uL (ref 0.00–0.07)
Basophils Absolute: 0 K/uL (ref 0.0–0.1)
Basophils Relative: 1 %
Eosinophils Absolute: 0.1 K/uL (ref 0.0–0.5)
Eosinophils Relative: 2 %
HCT: 33.4 % — ABNORMAL LOW (ref 36.0–46.0)
Hemoglobin: 10.8 g/dL — ABNORMAL LOW (ref 12.0–15.0)
Immature Granulocytes: 1 %
Lymphocytes Relative: 22 %
Lymphs Abs: 1 K/uL (ref 0.7–4.0)
MCH: 26.4 pg (ref 26.0–34.0)
MCHC: 32.3 g/dL (ref 30.0–36.0)
MCV: 81.7 fL (ref 80.0–100.0)
Monocytes Absolute: 0.4 K/uL (ref 0.1–1.0)
Monocytes Relative: 10 %
Neutro Abs: 2.9 K/uL (ref 1.7–7.7)
Neutrophils Relative %: 64 %
Platelet Count: 107 K/uL — ABNORMAL LOW (ref 150–400)
RBC: 4.09 MIL/uL (ref 3.87–5.11)
RDW: 15.3 % (ref 11.5–15.5)
WBC Count: 4.4 K/uL (ref 4.0–10.5)
nRBC: 0 % (ref 0.0–0.2)

## 2024-02-15 LAB — FERRITIN: Ferritin: 51 ng/mL (ref 11–307)

## 2024-02-15 LAB — RETIC PANEL
Immature Retic Fract: 12.3 % (ref 2.3–15.9)
RBC.: 4.09 MIL/uL (ref 3.87–5.11)
Retic Count, Absolute: 71.6 K/uL (ref 19.0–186.0)
Retic Ct Pct: 1.8 % (ref 0.4–3.1)
Reticulocyte Hemoglobin: 28.9 pg (ref >27.9)

## 2024-02-22 ENCOUNTER — Encounter: Payer: Self-pay | Admitting: Oncology

## 2024-02-22 ENCOUNTER — Inpatient Hospital Stay

## 2024-02-22 ENCOUNTER — Inpatient Hospital Stay: Attending: Oncology | Admitting: Oncology

## 2024-02-22 VITALS — BP 166/75 | HR 88 | Temp 97.8°F | Resp 18 | Wt 165.4 lb

## 2024-02-22 DIAGNOSIS — D509 Iron deficiency anemia, unspecified: Secondary | ICD-10-CM | POA: Insufficient documentation

## 2024-02-22 DIAGNOSIS — D696 Thrombocytopenia, unspecified: Secondary | ICD-10-CM | POA: Insufficient documentation

## 2024-02-22 DIAGNOSIS — K7469 Other cirrhosis of liver: Secondary | ICD-10-CM

## 2024-02-22 DIAGNOSIS — R161 Splenomegaly, not elsewhere classified: Secondary | ICD-10-CM | POA: Diagnosis not present

## 2024-02-22 DIAGNOSIS — K7460 Unspecified cirrhosis of liver: Secondary | ICD-10-CM | POA: Diagnosis not present

## 2024-02-22 DIAGNOSIS — D500 Iron deficiency anemia secondary to blood loss (chronic): Secondary | ICD-10-CM

## 2024-02-22 DIAGNOSIS — Z87891 Personal history of nicotine dependence: Secondary | ICD-10-CM | POA: Diagnosis not present

## 2024-02-22 DIAGNOSIS — D508 Other iron deficiency anemias: Secondary | ICD-10-CM

## 2024-02-22 MED ORDER — IRON SUCROSE 20 MG/ML IV SOLN
200.0000 mg | Freq: Once | INTRAVENOUS | Status: AC
  Administered 2024-02-22: 200 mg via INTRAVENOUS
  Filled 2024-02-22: qty 10

## 2024-02-22 NOTE — Progress Notes (Signed)
 Hematology/Oncology progress note [phone]     CHIEF COMPLAINTS/REASON FOR VISIT:  iron  deficiency anemia   ASSESSMENT & PLAN:   IDA (iron  deficiency anemia) # Iron  deficiency anemia.  Labs reviewed and discussed with patient Lab Results  Component Value Date   HGB 10.8 (L) 02/15/2024   TIBC 392 02/15/2024   IRONPCTSAT 11 02/15/2024   FERRITIN 51 02/15/2024     Patient tolerated Venofer  treatments. Hemoglobin and iron  panel of both 6 improved.  Recommend Venofer  200 mg x 1 today to further improve iron  stores. After that I recommend patient to take Slow Fe 1 tablet daily for maintenance  Suspect GI blood loss, I recommend patient to follow up with GI   Cirrhosis of liver (HCC) # Cirrhosis/ portal hypertension gastropathy.  Recommend patient to  follow-up with gastroenterology   Thrombocytopenia Mild thrombocytopenia, likely due to splenomegaly due to cirrhosis Stable counts..   Orders Placed This Encounter  Procedures   CBC with Differential (Cancer Center Only)    Standing Status:   Future    Expected Date:   06/24/2024    Expiration Date:   09/22/2024   Iron  and TIBC    Standing Status:   Future    Expected Date:   06/24/2024    Expiration Date:   09/22/2024   Ferritin    Standing Status:   Future    Expected Date:   06/24/2024    Expiration Date:   09/22/2024   Retic Panel    Standing Status:   Future    Expected Date:   06/24/2024    Expiration Date:   09/22/2024   Follow up in 4 months All questions were answered. The patient knows to call the clinic with any problems, questions or concerns.  Zelphia Cap, MD, PhD [facility] Hematology Oncology 02/22/2024   HISTORY OF PRESENTING ILLNESS:  Brenda Beasley is a  73 y.o.  female with PMH listed below was seen in consultation at the request of Fernande Ophelia JINNY DOUGLAS, MD   for evaluation of iron  deficiency anemia.   Reviewed patient's recent labs  6/21/202 Labs revealed anemia with hemoglobin of 9.1, MCV  76, normal total wbc and platlet Reviewed patient's previous labs ordered by primary care physician's office, anemia is chronic onset , duration is since 2019. Basline above 10 No aggravating or improving factors.  Associated signs and symptoms: Patient reports fatigue.  Mild SOB with exertion.  Denies weight loss, easy bruising, hematochezia, hemoptysis, hematuria. Context:  History of iron  deficiency: long standing history of IDA. Takes oral iron  supplementation Rectal bleeding: deneis.  Menstrual bleeding/ Vaginal bleeding : denies Hematemesis or hemoptysis : denies Blood in urine : denies   History partial colectomy about 15 years due to large polyp. Per patient, pathology showed pre-cancer polyp.  Family history is positive for colon cancer in mother and breast cancer in sister.  Accompanied by her son.  She craves for ice chips.   admission from 01/16/2021 - 01/17/2021 due to reported hematemesis which was later felt to be episode of epistaxis. Patient has heme positive stool 01/09/2021 upper EGD and colonoscopy.  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  Small hiatal hernia and grade 1 esophageal varices.  INTERVAL HISTORY Brenda Beasley is a 73 y.o. female who has above history reviewed by me today presents for follow up visit for  iron  deficiency anemia.  Patient previously tolerates IV Venofer   treatments. Chronic fatigue, worse. No blood in stool or dark stool.    Review of Systems  Constitutional:  Positive for fatigue. Negative for appetite change, chills, fever and unexpected weight change.  HENT:   Negative for hearing loss and voice change.   Eyes:  Negative for eye problems.  Respiratory:  Negative for chest tightness, cough and shortness of breath.   Cardiovascular:  Negative for chest pain.  Gastrointestinal:  Positive for blood in stool. Negative for abdominal distention and  abdominal pain.  Endocrine: Negative for hot flashes.  Genitourinary:  Negative for difficulty urinating and frequency.   Musculoskeletal:  Positive for back pain. Negative for arthralgias.  Skin:  Negative for itching and rash.  Neurological:  Negative for extremity weakness.  Hematological:  Negative for adenopathy.  Psychiatric/Behavioral:  Negative for confusion.     MEDICAL HISTORY:  Past Medical History:  Diagnosis Date   Anxiety    Asthma    Chronic pruritus    Cirrhosis of liver (HCC)    COPD (chronic obstructive pulmonary disease) (HCC)    Diabetes mellitus without complication (HCC)    Family history of breast cancer    Family history of colon cancer    Family history of kidney cancer    Family history of lung cancer    GERD (gastroesophageal reflux disease)    H/O esophageal varices    Hamartomatous polyp of large intestine (HCC)    Hematuria, microscopic    History of uterine fibroid    History of ventral hernia    Hyperlipidemia    Hypertension    IBS (irritable bowel syndrome)    IDA (iron  deficiency anemia) 11/13/2020   Osteoporosis with current pathological fracture 11/03/2018   Personal history of tobacco use, presenting hazards to health 09/20/2015   Portal hypertensive gastropathy (HCC)    Proteinuria    Stroke (HCC)    Tobacco abuse 10/04/2014   Vitamin B 12 deficiency    Vitamin D deficiency    Wears dentures    full upper and lower    SURGICAL HISTORY: Past Surgical History:  Procedure Laterality Date   CATARACT EXTRACTION W/PHACO Right 09/29/2023   Procedure: PHACOEMULSIFICATION, CATARACT, WITH IOL INSERTION 5.23 00:32.6;  Surgeon: Mittie Gaskin, MD;  Location: [facility];  Service: Ophthalmology;  Laterality: Right;   CATARACT EXTRACTION W/PHACO Left 10/13/2023   Procedure: PHACOEMULSIFICATION, CATARACT, WITH IOL INSERTION 4.80, 00:31.5;  Surgeon: Mittie Gaskin, MD;  Location: [facility];  Service: Ophthalmology;   Laterality: Left;   COLON SURGERY     COLONOSCOPY N/A 01/09/2021   Procedure: COLONOSCOPY;  Surgeon: Onita Elspeth Sharper, DO;  [facility];  Service: Gastroenterology;  Laterality: N/A;   COLONOSCOPY WITH PROPOFOL  N/A 11/23/2018   Procedure: COLONOSCOPY WITH PROPOFOL ;  Surgeon: Toledo, Ladell POUR, MD;  Location: [facility] ENDOSCOPY;  Service: Gastroenterology;  Laterality: N/A;   ESOPHAGOGASTRODUODENOSCOPY N/A 01/09/2021   Procedure: ESOPHAGOGASTRODUODENOSCOPY (EGD);  Surgeon: Onita Elspeth Sharper, DO;  Location: [facility];  Service: Gastroenterology;  Laterality: N/A;   ESOPHAGOGASTRODUODENOSCOPY N/A 07/02/2022   Procedure: ESOPHAGOGASTRODUODENOSCOPY (EGD);  Surgeon: Onita Elspeth Sharper, DO;  Location: [facility] END[facility]COPY;  Service: Gastroenterology;  Laterality: N/A;   ESOPHAGOGASTRODUODENOSCOPY (EGD) WITH PROPOFOL  N/A 11/23/2018   Procedure: ESOPHAGOGASTRODUODENOSCOPY (EGD) WITH PROPOFOL ;  Surgeon: Toledo, Ladell POUR, MD;  Location: [facility] ENDOSCOPY;  Service: Gastroenterology;  Laterality: N/A;   ESOPHAGOGASTRODUODENOSCOPY (EGD) WITH PROPOFOL  N/A 01/17/2021   Procedure: ESOPHAGOGASTRODUODENOSCOPY (EGD) WITH PROPOFOL ;  Surgeon: Unk Corinn Skiff, MD;  Location: [facility] ENDOSCOPY;  Service:  Gastroenterology;  Laterality: N/A;   HERNIA REPAIR     PARTIAL COLECTOMY     TONSILLECTOMY     VENTRAL HERNIA REPAIR      SOCIAL HISTORY: Social History   Socioeconomic History   Marital status: Widowed    Spouse name: Not on file   Number of children: Not on file   Years of education: Not on file   Highest education level: Not on file  Occupational History   Not on file  Tobacco Use   Smoking status: Former    Current packs/day: 0.00    Average packs/day: 1 pack/day for 52.0 years (52.0 ttl pk-yrs)    Types: Cigarettes    Start date: 06/12/1966    Quit date: 06/12/2018    Years since quitting: 5.7   Smokeless tobacco: Never  Vaping Use   Vaping status: Never Used  Substance and Sexual  Activity   Alcohol use: No    Alcohol/week: 0.0 standard drinks of alcohol   Drug use: Not Currently   Sexual activity: Not on file  Other Topics Concern   Not on file  Social History Narrative   Not on file   Social Drivers of Health   Financial Resource Strain: Low Risk  (07/26/2023)   Received from [facility]   Overall Financial Resource Strain (CARDIA)    Difficulty of Paying Living Expenses: Not hard at all  Food Insecurity: No Food Insecurity (07/26/2023)   Received from [facility]   Hunger Vital Sign    Within the past 12 months, you worried that your food would run out before you got the money to buy more.: Never true    Within the past 12 months, the food you bought just didn't last and you didn't have money to get more.: Never true  Transportation Needs: No Transportation Needs (07/26/2023)   Received from [facility] - Transportation    In the past 12 months, has lack of transportation kept you from medical appointments or from getting medications?: No    Lack of Transportation (Non-Medical): No  Physical Activity: Not on file  Stress: Not on file  Social Connections: Not on file  Intimate Partner Violence: Not on file    FAMILY HISTORY: Family History  Problem Relation Age of Onset   Diabetes Mother    Diabetes Father    Diabetes Sister    Diabetes Maternal Grandmother    Diabetes Paternal Grandmother     ALLERGIES:  is allergic to molds & smuts, other, penicillins, pioglitazone, byetta 10 mcg pen [exenatide], dapagliflozin, jardiance [empagliflozin], victoza [liraglutide], sulfa antibiotics, and sulfasalazine.  MEDICATIONS:  Current Outpatient Medications  Medication Sig Dispense Refill   albuterol  (VENTOLIN  HFA) 108 (90 Base) MCG/ACT inhaler SMARTSIG:2 Puff(s) By Mouth Every 6 Hours PRN     BD PEN NEEDLE NANO 2ND GEN 32G X 4 MM MISC USE 1 EACH ONCE DAILY     budesonide (PULMICORT) 0.5 MG/2ML  nebulizer solution Take 0.5 mg by nebulization 2 (two) times daily.     Cholecalciferol (VITAMIN D3) 5000 units TABS Take 5,000 Units by mouth daily.      Cyanocobalamin  (VITAMIN B-12 PO) Take by mouth daily.     fluticasone  (FLONASE ) 50 MCG/ACT nasal spray Place 2 sprays into the nose daily.     gabapentin (NEURONTIN) 100 MG capsule Take 100 mg by mouth at bedtime as needed.     glipiZIDE (GLUCOTROL) 10 MG tablet Take 20 mg  by mouth 2 (two) times daily.     hydrochlorothiazide  (HYDRODIURIL ) 25 MG tablet Hold until followup with outpatient doctor due to intermittent low blood pressure.     lisinopril  (ZESTRIL ) 40 MG tablet Hold until followup with outpatient doctor due to intermittent low blood pressure.     lovastatin (MEVACOR) 40 MG tablet Take 2 tablets by mouth daily.     MAGNESIUM  PO Take 1 tablet by mouth at bedtime.     metFORMIN (GLUCOPHAGE) 1000 MG tablet Take 1,000 mg by mouth 2 (two) times daily.     omeprazole (PRILOSEC) 20 MG capsule Take 20 mg by mouth 2 (two) times daily.     prednisoLONE sodium phosphate  (PEDIAPRED) 5 MG/5ML SOLN Take by mouth.     TRESIBA FLEXTOUCH 200 UNIT/ML FlexTouch Pen Inject 54 Units into the skin daily.     Insulin  Glargine, 2 Unit Dial, (TOUJEO MAX SOLOSTAR) 300 UNIT/ML SOPN Inject 300 Units/mL into the skin at bedtime. (Patient not taking: Reported on 02/22/2024)     torsemide (DEMADEX) 20 MG tablet Take by mouth. (Patient not taking: Reported on 02/22/2024)     No current facility-administered medications for this visit.     PHYSICAL EXAMINATION: ECOG PERFORMANCE STATUS: 1 - Symptomatic but completely ambulatory Vitals:   02/22/24 1311 02/22/24 1321  BP: (!) 157/81 (!) 166/75  Pulse: 88   Resp: 18   Temp: 97.8 F (36.6 C)   SpO2: 94% 97%   Filed Weights   02/22/24 1311  Weight: 165 lb 6.4 oz (75 kg)    Physical Exam Constitutional:      General: She is not in acute distress.    Appearance: She is obese.  HENT:     Head: Normocephalic  and atraumatic.  Eyes:     General: No scleral icterus. Cardiovascular:     Rate and Rhythm: Normal rate.  Pulmonary:     Effort: Pulmonary effort is normal. No respiratory distress.  Abdominal:     General: Bowel sounds are normal.     Palpations: Abdomen is soft.  Musculoskeletal:        General: No deformity. Normal range of motion.     Cervical back: Normal range of motion and neck supple.  Skin:    General: Skin is warm and dry.     Findings: No rash.  Neurological:     Mental Status: She is alert and oriented to person, place, and time. Mental status is at baseline.  Psychiatric:        Mood and Affect: Mood normal.      Laboratory studies I have reviewed the data as listed    Latest Ref Rng & Units 02/15/2024   12:52 PM  CBC  WBC 4.0 - 10.5 K/uL 4.4   Hemoglobin 12.0 - 15.0 g/dL 89.1   Hematocrit 63.9 - 46.0 % 33.4   Platelets 150 - 400 K/uL 107        Latest Ref Rng & Units 04/12/2023    1:04 PM 07/06/2022   11:03 AM 01/16/2021    1:23 PM  CMP  Glucose 70 - 99 mg/dL 853  771  90   BUN 8 - 23 mg/dL 19  19  15    Creatinine 0.44 - 1.00 mg/dL 9.09  8.99  9.04   Sodium 135 - 145 mmol/L 139  134  139   Potassium 3.5 - 5.1 mmol/L 3.8  3.7  3.6   Chloride 98 - 111 mmol/L 99  97  102  CO2 22 - 32 mmol/L 24  26  26    Calcium 8.9 - 10.3 mg/dL 8.8  8.8  9.4   Total Protein 6.5 - 8.1 g/dL 7.0  6.9  7.7   Total Bilirubin <1.2 mg/dL 0.4  0.6  0.6   Alkaline Phos 38 - 126 U/L 58  53  66   AST 15 - 41 U/L 20  21  26    ALT 0 - 44 U/L 13  17  16      Iron /TIBC/Ferritin/ %Sat    Component Value Date/Time   IRON  44 02/15/2024 1252   TIBC 392 02/15/2024 1252   FERRITIN 51 02/15/2024 1252   IRONPCTSAT 11 02/15/2024 1252     RADIOGRAPHIC STUDIES: I have personally reviewed the radiological images as listed and agreed with the findings in the report. CT CHEST WO CONTRAST Result Date: 02/10/2024 CLINICAL DATA:  73 year old female with COPD. Former smoker. New 8 mm  ground-glass opacity in the right lower lobe in February this year. EXAM: CT CHEST WITHOUT CONTRAST TECHNIQUE: Multidetector CT imaging of the chest was performed following the standard protocol without IV contrast. RADIATION DOSE REDUCTION: This exam was performed according to the departmental dose-optimization program which includes automated exposure control, adjustment of the mA and/or kV according to patient size and/or use of iterative reconstruction technique. COMPARISON:  Conventional noncontrast chest CT 06/29/2023. Low-dose screening chest CT 04/08/2022 and earlier Abdomen ultrasound 12/28/2023. FINDINGS: Cardiovascular: Extensive Calcified aortic atherosclerosis. Calcified coronary artery atherosclerosis and/or stent. Heart size remains within normal limits. No pericardial effusion. Chronic tortuosity of the descending thoracic aorta (37 mm) remain stable since 2022, does not meet size criteria for fusiform aneurysm. Mediastinum/Nodes: Negative for mediastinal mass or lymphadenopathy. Lungs/Pleura: Chronic centrilobular emphysema. Major airways are patent. Lung volumes appear not significantly changed since 2022. Right lower lobe irregular 7 mm nodule on series 4, image 103 is new since 2022, corresponds to the February finding and appears more solid since that time. Size is stable. Other small scattered pulmonary nodules, occasional small calcified granulomas in both lungs appears stable since 02/19/2021, including chronic 8 mm left lung nodule located near the hilum on series 4, image 54 (series 3, image 118 on 02/19/2021). No pleural effusion. No consolidation. No active lung inflammation identified. Upper Abdomen: Positive for nodular liver contour suspicious for cirrhosis (series 2, image 127) and chronic splenomegaly which is partially visible. Negative visible noncontrast pancreas, adrenal glands and bowel. Stable visible right kidney. Musculoskeletal: Stable visualized osseous structures.  IMPRESSION: 1. Chronic Emphysema (ICD10-J43.9) with right lower lobe lung 8 mm nodule described on 06/29/2023 persists and appears more solid (series 4, image 103), is new since 02/19/2021, and is suspicious for bronchogenic carcinoma. Recommend referral to [facility]). 2. Numerous other chronic lung nodules remain stable since 2022, including an 8 mm left lung nodule located near the hilum (series 4, image 54). 3. Aortic Atherosclerosis (ICD10-I70.0). Stable chronically tortuous descending thoracic aorta. Coronary artery atherosclerosis. 4. Cirrhosis, chronic Splenomegaly. Electronically Signed   By: VEAR Hurst M.D.   On: 02/10/2024 13:57   US  Abdomen Complete Result Date: 12/30/2023 CLINICAL DATA:  HCC screening. EXAM: ABDOMEN ULTRASOUND COMPLETE COMPARISON:  June 29, 2023 FINDINGS: Gallbladder: No gallstones or wall thickening visualized. No sonographic Murphy sign noted by sonographer. Common bile duct: Diameter: 2.3 mm. Liver: No focal lesion identified. Increased echotexture. Portal vein is patent on color Doppler imaging with normal direction of blood flow towards the liver. IVC: No abnormality visualized. Pancreas: Visualized  portion unremarkable. Spleen: Spleen measures 16.5 cm with volume 759 cc. Right Kidney: Length: 11.2 cm. Increased echotexture. No hydronephrosis visualized. 2.8 cm simple cyst is noted. No follow-up is recommended. Left Kidney: Length: 11.7 cm. Increased echotexture. No mass or hydronephrosis visualized. Abdominal aorta: No aneurysm visualized. Other findings: None. IMPRESSION: 1. No acute abnormality identified. 2. Increased echotexture of the liver. This is a nonspecific finding but can be seen in fatty infiltration of liver. 3. Splenomegaly. 4. Increased echotexture of the kidneys bilaterally. This can be seen in medical renal disease. Electronically Signed   By: Craig Farr M.D.   On: 12/30/2023 15:53

## 2024-02-22 NOTE — Assessment & Plan Note (Signed)
 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

## 2024-02-22 NOTE — Assessment & Plan Note (Addendum)
#   Iron  deficiency anemia.  Labs reviewed and discussed with patient Lab Results  Component Value Date   HGB 10.8 (L) 02/15/2024   TIBC 392 02/15/2024   IRONPCTSAT 11 02/15/2024   FERRITIN 51 02/15/2024     Patient tolerated Venofer  treatments. Hemoglobin and iron  panel of both 6 improved.  Recommend Venofer  200 mg x 1 today to further improve iron  stores. After that I recommend patient to take Slow Fe 1 tablet daily for maintenance  Suspect GI blood loss, I recommend patient to follow up with GI

## 2024-02-22 NOTE — Patient Instructions (Signed)

## 2024-02-22 NOTE — Assessment & Plan Note (Signed)
#   Cirrhosis/ portal hypertension gastropathy.  Recommend patient to  follow-up with gastroenterology

## 2024-03-08 ENCOUNTER — Other Ambulatory Visit: Payer: Self-pay | Admitting: Pulmonary Disease

## 2024-03-10 ENCOUNTER — Other Ambulatory Visit: Payer: Self-pay | Admitting: Pulmonary Disease

## 2024-03-10 DIAGNOSIS — R918 Other nonspecific abnormal finding of lung field: Secondary | ICD-10-CM

## 2024-03-10 DIAGNOSIS — R911 Solitary pulmonary nodule: Secondary | ICD-10-CM

## 2024-03-14 ENCOUNTER — Encounter
Admission: RE | Admit: 2024-03-14 | Discharge: 2024-03-14 | Disposition: A | Discharge status: Home / Self Care | Source: Ambulatory Visit | Attending: Pulmonary Disease | Admitting: Pulmonary Disease

## 2024-03-14 ENCOUNTER — Ambulatory Visit
Admission: RE | Admit: 2024-03-14 | Discharge: 2024-03-14 | Disposition: A | Discharge status: Home / Self Care | Source: Ambulatory Visit | Attending: Pulmonary Disease | Admitting: Pulmonary Disease

## 2024-03-14 ENCOUNTER — Other Ambulatory Visit: Payer: Self-pay

## 2024-03-14 DIAGNOSIS — Z01812 Encounter for preprocedural laboratory examination: Secondary | ICD-10-CM

## 2024-03-14 DIAGNOSIS — E119 Type 2 diabetes mellitus without complications: Secondary | ICD-10-CM | POA: Insufficient documentation

## 2024-03-14 DIAGNOSIS — Z01818 Encounter for other preprocedural examination: Secondary | ICD-10-CM | POA: Insufficient documentation

## 2024-03-14 DIAGNOSIS — Z01810 Encounter for preprocedural cardiovascular examination: Secondary | ICD-10-CM | POA: Diagnosis present

## 2024-03-14 DIAGNOSIS — J439 Emphysema, unspecified: Secondary | ICD-10-CM | POA: Diagnosis present

## 2024-03-14 DIAGNOSIS — R911 Solitary pulmonary nodule: Secondary | ICD-10-CM | POA: Insufficient documentation

## 2024-03-14 DIAGNOSIS — I10 Essential (primary) hypertension: Secondary | ICD-10-CM

## 2024-03-14 DIAGNOSIS — R918 Other nonspecific abnormal finding of lung field: Secondary | ICD-10-CM | POA: Diagnosis present

## 2024-03-14 HISTORY — DX: Solitary pulmonary nodule: R91.1

## 2024-03-14 HISTORY — DX: Chronic congestive splenomegaly: D73.2

## 2024-03-14 HISTORY — DX: Dyspnea, unspecified: R06.00

## 2024-03-14 HISTORY — DX: Unspecified osteoarthritis, unspecified site: M19.90

## 2024-03-14 HISTORY — DX: Atherosclerosis of aorta: I70.0

## 2024-03-14 HISTORY — DX: Cardiac murmur, unspecified: R01.1

## 2024-03-14 HISTORY — DX: Pneumonia, unspecified organism: J18.9

## 2024-03-14 HISTORY — DX: Unspecified cirrhosis of liver: K74.60

## 2024-03-14 LAB — CBC
HCT: 34.9 % — ABNORMAL LOW (ref 36.0–46.0)
Hemoglobin: 11.3 g/dL — ABNORMAL LOW (ref 12.0–15.0)
MCH: 26.5 pg (ref 26.0–34.0)
MCHC: 32.4 g/dL (ref 30.0–36.0)
MCV: 81.7 fL (ref 80.0–100.0)
Platelets: 112 K/uL — ABNORMAL LOW (ref 150–400)
RBC: 4.27 MIL/uL (ref 3.87–5.11)
RDW: 15.5 % (ref 11.5–15.5)
WBC: 6 K/uL (ref 4.0–10.5)
nRBC: 0 % (ref 0.0–0.2)

## 2024-03-14 LAB — BASIC METABOLIC PANEL WITH GFR
Anion gap: 12 (ref 5–15)
BUN: 20 mg/dL (ref 8–23)
CO2: 25 mmol/L (ref 22–32)
Calcium: 8.4 mg/dL — ABNORMAL LOW (ref 8.9–10.3)
Chloride: 102 mmol/L (ref 98–111)
Creatinine, Ser: 0.82 mg/dL (ref 0.44–1.00)
GFR, Estimated: >60 mL/min (ref >60)
Glucose, Bld: 86 mg/dL (ref 70–99)
Potassium: 3.5 mmol/L (ref 3.5–5.1)
Sodium: 139 mmol/L (ref 135–145)

## 2024-03-14 NOTE — Patient Instructions (Addendum)
 Your procedure is scheduled on: 03/17/24 - Friday Report to the Registration Desk on the 1st floor of the [facility]. To find out your arrival time, please call [phone] between 1PM - 3PM on: 03/16/24 - Thursday If your arrival time is 6:00 am, do not arrive before that time as the [facility] entrance doors do not open until 6:00 am.  REMEMBER: Instructions that are not followed completely may result in serious medical risk, up to and including death; or upon the discretion of your surgeon and anesthesiologist your surgery may need to be rescheduled.  Do not eat food or drink any liquids after midnight the night before surgery.  No gum chewing or hard candies.  One week prior to surgery: Stop Anti-inflammatories (NSAIDS) such as Advil , Aleve , Ibuprofen , Motrin , Naproxen , Naprosyn  and Aspirin based products such as Excedrin, Goody's Powder, BC Powder.  Stop ANY OVER THE COUNTER supplements until after surgery. B12, Vitamin D3, Magnesium .  You may take Tylenol  if needed for pain up until the day of surgery.  HOLD metFORMIN (GLUCOPHAGE) beginning 10/29.   DEGLUDEC (TRESIBA FLEXTOUCH) Insulin  - hold on the morning of surgery  ON THE DAY OF SURGERY ONLY TAKE THESE MEDICATIONS WITH SIPS OF WATER:  budesonide (PULMICORT)  fluticasone  (FLONASE )  omeprazole (PRILOSEC)   Use inhalers on the day of surgery and bring to the hospital.  No Alcohol for 24 hours before or after surgery.  No Smoking including e-cigarettes for 24 hours before surgery.  No chewable tobacco products for at least 6 hours before surgery.  No nicotine patches on the day of surgery.  Do not use any recreational drugs for at least a week (preferably 2 weeks) before your surgery.  Please be advised that the combination of cocaine and anesthesia may have negative outcomes, up to and including death. If you test positive for cocaine, your surgery will be cancelled.  On the morning of surgery brush your teeth  with toothpaste and water, you may rinse your mouth with mouthwash if you wish. Do not swallow any toothpaste or mouthwash.  Do not wear jewelry, make-up, hairpins, clips or nail polish.  For welded (permanent) jewelry: bracelets, anklets, waist bands, etc.  Please have this removed prior to surgery.  If it is not removed, there is a chance that hospital personnel will need to cut it off on the day of surgery.  Do not wear lotions, powders, or perfumes.   Do not shave body hair from the neck down 48 hours before surgery.  Contact lenses, hearing aids and dentures may not be worn into surgery.  Do not bring valuables to the hospital. [facility] is not responsible for any missing/lost belongings or valuables.   Notify your doctor if there is any change in your medical condition (cold, fever, infection).  Wear comfortable clothing (specific to your surgery type) to the hospital.  After surgery, you can help prevent lung complications by doing breathing exercises.  Take deep breaths and cough every 1-2 hours. Your doctor may order a device called an Incentive Spirometer to help you take deep breaths. When coughing or sneezing, hold a pillow firmly against your incision with both hands. This is called "splinting." Doing this helps protect your incision. It also decreases belly discomfort.  If you are being admitted to the hospital overnight, leave your suitcase in the car. After surgery it may be brought to your room.  In case of increased patient census, it may be necessary for you, the patient, to  continue your postoperative care in the Same Day Surgery department.  If you are being discharged the day of surgery, you will not be allowed to drive home. You will need a responsible individual to drive you home and stay with you for 24 hours after surgery.   If you are taking public transportation, you will need to have a responsible individual with you.  Please call the Pre-admissions  Testing Dept. at [phone] if you have any questions about these instructions.  Surgery Visitation Policy:  Patients having surgery or a procedure may have two visitors.  Children under the age of 12 must have an adult with them who is not the patient.  Inpatient Visitation:    Visiting hours are 7 a.m. to 8 p.m. Up to four visitors are allowed at one time in a patient room. The visitors may rotate out with other people during the day.  One visitor age 60 or older may stay with the patient overnight and must be in the room by 8 p.m.   Merchandiser, Retail to address health-related social needs:  [email or web address]

## 2024-03-17 ENCOUNTER — Encounter: Payer: Self-pay | Admitting: Urgent Care

## 2024-03-17 ENCOUNTER — Ambulatory Visit: Admitting: Anesthesiology

## 2024-03-17 ENCOUNTER — Ambulatory Visit

## 2024-03-17 ENCOUNTER — Encounter: Payer: Self-pay | Admitting: Pulmonary Disease

## 2024-03-17 ENCOUNTER — Ambulatory Visit
Admission: RE | Admit: 2024-03-17 | Discharge: 2024-03-17 | Disposition: A | Discharge status: Home / Self Care | Attending: Pulmonary Disease | Admitting: Pulmonary Disease

## 2024-03-17 ENCOUNTER — Other Ambulatory Visit: Payer: Self-pay

## 2024-03-17 ENCOUNTER — Encounter
Admission: RE | Disposition: A | Discharge status: Home / Self Care | Payer: Self-pay | Source: Home / Self Care | Attending: Pulmonary Disease

## 2024-03-17 DIAGNOSIS — T17890A Other foreign object in other parts of respiratory tract causing asphyxiation, initial encounter: Secondary | ICD-10-CM | POA: Insufficient documentation

## 2024-03-17 DIAGNOSIS — K7460 Unspecified cirrhosis of liver: Secondary | ICD-10-CM | POA: Insufficient documentation

## 2024-03-17 DIAGNOSIS — R590 Localized enlarged lymph nodes: Secondary | ICD-10-CM | POA: Diagnosis present

## 2024-03-17 DIAGNOSIS — Z801 Family history of malignant neoplasm of trachea, bronchus and lung: Secondary | ICD-10-CM | POA: Insufficient documentation

## 2024-03-17 DIAGNOSIS — Z833 Family history of diabetes mellitus: Secondary | ICD-10-CM | POA: Insufficient documentation

## 2024-03-17 DIAGNOSIS — I119 Hypertensive heart disease without heart failure: Secondary | ICD-10-CM | POA: Insufficient documentation

## 2024-03-17 DIAGNOSIS — E119 Type 2 diabetes mellitus without complications: Secondary | ICD-10-CM | POA: Insufficient documentation

## 2024-03-17 DIAGNOSIS — W44F9XA Other object of natural or organic material, entering into or through a natural orifice, initial encounter: Secondary | ICD-10-CM | POA: Diagnosis not present

## 2024-03-17 DIAGNOSIS — Z8673 Personal history of transient ischemic attack (TIA), and cerebral infarction without residual deficits: Secondary | ICD-10-CM | POA: Insufficient documentation

## 2024-03-17 DIAGNOSIS — J4489 Other specified chronic obstructive pulmonary disease: Secondary | ICD-10-CM | POA: Insufficient documentation

## 2024-03-17 DIAGNOSIS — R911 Solitary pulmonary nodule: Secondary | ICD-10-CM | POA: Insufficient documentation

## 2024-03-17 DIAGNOSIS — J439 Emphysema, unspecified: Secondary | ICD-10-CM

## 2024-03-17 DIAGNOSIS — K219 Gastro-esophageal reflux disease without esophagitis: Secondary | ICD-10-CM | POA: Diagnosis not present

## 2024-03-17 DIAGNOSIS — Z01812 Encounter for preprocedural laboratory examination: Secondary | ICD-10-CM

## 2024-03-17 DIAGNOSIS — G709 Myoneural disorder, unspecified: Secondary | ICD-10-CM | POA: Insufficient documentation

## 2024-03-17 HISTORY — PX: VIDEO BRONCHOSCOPY WITH ENDOBRONCHIAL NAVIGATION: SHX6175

## 2024-03-17 HISTORY — PX: VIDEO BRONCHOSCOPY WITH ENDOBRONCHIAL ULTRASOUND: SHX6177

## 2024-03-17 LAB — GLUCOSE, CAPILLARY
Glucose-Capillary: 140 mg/dL — ABNORMAL HIGH (ref 70–99)
Glucose-Capillary: 176 mg/dL — ABNORMAL HIGH (ref 70–99)

## 2024-03-17 SURGERY — VIDEO BRONCHOSCOPY WITH ENDOBRONCHIAL NAVIGATION
Anesthesia: General

## 2024-03-17 MED ORDER — IPRATROPIUM-ALBUTEROL 0.5-2.5 (3) MG/3ML IN SOLN
3.0000 mL | Freq: Once | RESPIRATORY_TRACT | Status: AC
  Administered 2024-03-17: 3 mL via RESPIRATORY_TRACT

## 2024-03-17 MED ORDER — PHENYLEPHRINE HCL-NACL 20-0.9 MG/250ML-% IV SOLN
INTRAVENOUS | Status: AC
  Filled 2024-03-17: qty 250

## 2024-03-17 MED ORDER — CHLORHEXIDINE GLUCONATE 0.12 % MT SOLN
OROMUCOSAL | Status: AC
Start: 2024-03-17 — End: 2024-03-17
  Filled 2024-03-17: qty 15

## 2024-03-17 MED ORDER — ALBUTEROL SULFATE HFA 108 (90 BASE) MCG/ACT IN AERS
INHALATION_SPRAY | RESPIRATORY_TRACT | Status: AC
  Filled 2024-03-17: qty 6.7

## 2024-03-17 MED ORDER — PROPOFOL 10 MG/ML IV BOLUS
INTRAVENOUS | Status: AC
  Filled 2024-03-17: qty 20

## 2024-03-17 MED ORDER — ONDANSETRON HCL 4 MG/2ML IJ SOLN
INTRAMUSCULAR | Status: AC
  Filled 2024-03-17: qty 2

## 2024-03-17 MED ORDER — ORAL CARE MOUTH RINSE: 15.0000 mL | Freq: Once | OROMUCOSAL | Status: AC

## 2024-03-17 MED ORDER — CHLORHEXIDINE GLUCONATE 0.12 % MT SOLN
15.0000 mL | Freq: Once | OROMUCOSAL | Status: AC
  Administered 2024-03-17: 15 mL via OROMUCOSAL

## 2024-03-17 MED ORDER — SUGAMMADEX SODIUM 200 MG/2ML IV SOLN
INTRAVENOUS | Status: DC | PRN
  Administered 2024-03-17: 149.6 mg via INTRAVENOUS

## 2024-03-17 MED ORDER — PHENYLEPHRINE 80 MCG/ML (10ML) SYRINGE FOR IV PUSH (FOR BLOOD PRESSURE SUPPORT)
PREFILLED_SYRINGE | INTRAVENOUS | Status: AC
  Filled 2024-03-17: qty 10

## 2024-03-17 MED ORDER — PHENYLEPHRINE HCL-NACL 20-0.9 MG/250ML-% IV SOLN
INTRAVENOUS | Status: DC | PRN
  Administered 2024-03-17: 40 ug/min via INTRAVENOUS
  Administered 2024-03-17: 80 ug/min via INTRAVENOUS

## 2024-03-17 MED ORDER — MIDAZOLAM HCL (PF) 2 MG/2ML IJ SOLN
INTRAMUSCULAR | Status: DC | PRN
  Administered 2024-03-17 (×2): 1 mg via INTRAVENOUS

## 2024-03-17 MED ORDER — MIDAZOLAM HCL 2 MG/2ML IJ SOLN
INTRAMUSCULAR | Status: AC
  Filled 2024-03-17: qty 2

## 2024-03-17 MED ORDER — FENTANYL CITRATE (PF) 100 MCG/2ML IJ SOLN: 25.0000 ug | INTRAMUSCULAR | Status: DC | PRN

## 2024-03-17 MED ORDER — OXYCODONE HCL 5 MG PO TABS
5.0000 mg | ORAL_TABLET | Freq: Once | ORAL | Status: AC | PRN
  Administered 2024-03-17: 5 mg via ORAL

## 2024-03-17 MED ORDER — OXYCODONE HCL 5 MG PO TABS
ORAL_TABLET | ORAL | Status: AC
  Filled 2024-03-17: qty 1

## 2024-03-17 MED ORDER — PHENYLEPHRINE HCL (PRESSORS) 10 MG/ML IV SOLN
INTRAVENOUS | Status: AC
  Filled 2024-03-17: qty 1

## 2024-03-17 MED ORDER — ROCURONIUM BROMIDE 10 MG/ML (PF) SYRINGE
PREFILLED_SYRINGE | INTRAVENOUS | Status: AC
Start: 2024-03-17 — End: 2024-03-17
  Filled 2024-03-17: qty 10

## 2024-03-17 MED ORDER — SODIUM CHLORIDE 0.9 % IV SOLN: INTRAVENOUS | Status: DC

## 2024-03-17 MED ORDER — FENTANYL CITRATE (PF) 100 MCG/2ML IJ SOLN
INTRAMUSCULAR | Status: AC
  Filled 2024-03-17: qty 2

## 2024-03-17 MED ORDER — OXYCODONE HCL 5 MG/5ML PO SOLN: 5.0000 mg | Freq: Once | ORAL | Status: AC | PRN

## 2024-03-17 MED ORDER — ONDANSETRON HCL 4 MG/2ML IJ SOLN
INTRAMUSCULAR | Status: DC | PRN
  Administered 2024-03-17: 4 mg via INTRAVENOUS

## 2024-03-17 MED ORDER — DEXAMETHASONE SOD PHOSPHATE PF 10 MG/ML IJ SOLN
INTRAMUSCULAR | Status: DC | PRN
  Administered 2024-03-17: 8 mg via INTRAVENOUS

## 2024-03-17 MED ORDER — PROPOFOL 1000 MG/100ML IV EMUL
INTRAVENOUS | Status: AC
  Filled 2024-03-17: qty 100

## 2024-03-17 MED ORDER — IPRATROPIUM-ALBUTEROL 0.5-2.5 (3) MG/3ML IN SOLN
RESPIRATORY_TRACT | Status: AC
  Filled 2024-03-17: qty 3

## 2024-03-17 MED ORDER — FENTANYL CITRATE (PF) 100 MCG/2ML IJ SOLN
INTRAMUSCULAR | Status: DC | PRN
  Administered 2024-03-17: 50 ug via INTRAVENOUS

## 2024-03-17 MED ORDER — LACTATED RINGERS IV SOLN
INTRAVENOUS | Status: DC | PRN
Start: 2024-03-17 — End: 2024-03-17

## 2024-03-17 MED ORDER — PROPOFOL 10 MG/ML IV BOLUS
INTRAVENOUS | Status: DC | PRN
  Administered 2024-03-17: 100 mg via INTRAVENOUS

## 2024-03-17 MED ORDER — LIDOCAINE HCL (PF) 2 % IJ SOLN
INTRAMUSCULAR | Status: AC
  Filled 2024-03-17: qty 5

## 2024-03-17 MED ORDER — PHENYLEPHRINE 80 MCG/ML (10ML) SYRINGE FOR IV PUSH (FOR BLOOD PRESSURE SUPPORT)
PREFILLED_SYRINGE | INTRAVENOUS | Status: DC | PRN
  Administered 2024-03-17: 80 ug via INTRAVENOUS

## 2024-03-17 MED ORDER — ROCURONIUM BROMIDE 100 MG/10ML IV SOLN
INTRAVENOUS | Status: DC | PRN
  Administered 2024-03-17: 50 mg via INTRAVENOUS
  Administered 2024-03-17: 20 mg via INTRAVENOUS

## 2024-03-17 MED ORDER — LIDOCAINE HCL (CARDIAC) PF 100 MG/5ML IV SOSY
PREFILLED_SYRINGE | INTRAVENOUS | Status: DC | PRN
  Administered 2024-03-17: 60 mg via INTRAVENOUS

## 2024-03-17 NOTE — Anesthesia Postprocedure Evaluation (Signed)
 Anesthesia Post Note  Patient: Brenda Beasley  Procedure(s) Performed: VIDEO BRONCHOSCOPY WITH ENDOBRONCHIAL NAVIGATION BRONCHOSCOPY, WITH EBUS  Patient location during evaluation: PACU Anesthesia Type: General Level of consciousness: awake and alert Pain management: pain level controlled Vital Signs Assessment: post-procedure vital signs reviewed and stable Respiratory status: spontaneous breathing, nonlabored ventilation, respiratory function stable and patient connected to nasal cannula oxygen Cardiovascular status: blood pressure returned to baseline and stable Postop Assessment: no apparent nausea or vomiting Anesthetic complications: no   No notable events documented.   Last Vitals:  Vitals:   03/17/24 0930 03/17/24 0953  BP: (!) 155/58 (!) 155/56  Pulse: 80 79  Resp: 18 16  Temp: (!) 36.3 C 36.6 C  SpO2: 94% 94%    Last Pain:  Vitals:   03/17/24 1002  TempSrc:   PainSc: 4                  [name]

## 2024-03-17 NOTE — Procedures (Addendum)
 ROBOTIC NAVIGATIONAL BRONCHOSCOPY PROCEDURE NOTE 31627  FIBEROPTIC BRONCHOSCOPY WITH BRONCHOALVEOLAR LAVAGE PROCEDURE NOTE 31624  THERAPEUTIC ASPIRATION OF TRACHEOBRONCHIAL TREE 31645  FIDUCIAL MARKER PLACEMENT 68373  ENDOBRONCHIAL ULTRASOUND X >/1 LYMPH NODE PROCEDURE NOTE 31652 31653  TRANSBRONCHIAL FNA X 1 LOBE 31629  TRANSBRONCHIAL SURGICAL LUNG BIOPSY X 1 LOBE 31628  RADIAL ENDOBRONCHIAL [id number]  TRANSBRONCHIAL CYTOPATHOLOGY BRUSHINGS [id number]    Flexible bronchoscopy was performed  by : Parris MD  assistance by : 1)Repiratory therapist  and 2)cytotech staff and 3) Anesthesia team and 4) Flouroscopy team and 5) IT supporting staff for robotic platform   Indication for the procedure was :  Pre-procedural H&P. The following assessment was performed on the day of the procedure prior to initiating sedation History:  Chest pain n Dyspnea y Hemoptysis n Cough y Fever n Other pertinent items n  Examination Vital signs -reviewed as per nursing documentation today Cardiac    Murmurs: n  Rubs : n  Gallop: n Lungs Wheezing: n Rales : n Rhonchi :y  Other pertinent findings: SOB/hypoxemia due to chronic lung disease   Pre-procedural assessment for Procedural Sedation included: Depth of sedation: As per anesthesia team  ASA Classification:  2 Mallampati airway assessment: 3    Medication list reviewed: y  The patient's interval history was taken and revealed: no new complaints The pre- procedure physical examination revealed: No new findings Refer to prior clinic note for details.  Informed Consent: Informed consent was obtained from:  patient after explanation of procedure and risks, benefits, as well as alternative procedures available.  Explanation of level of sedation and possible transfusion was also provided.    Procedural Preparation: Time out was performed and patient was identified by name and birthdate and procedure to be performed and side  for sampling, if any, was specified. Pt was intubated by anesthesia.  The patient was appropriately draped.   Fiberoptic bronchoscopy with airway inspection, therapeutic aspiration of tracheobronchial tree and BAL Procedure findings:  Bronchoscope was inserted via ETT  without difficulty.  Posterior oropharynx, epiglottis, arytenoids, false cords and vocal cords were not visualized as these were bypassed by endotracheal tube. The distal trachea was normal in circumference and appearance without mucosal, cartilaginous or branching abnormalities.  The main carina was mildly splayed . All right and left lobar airways were NOT visualized to the Subsegmental level due to obstructed airways from phlegm and mucus  plugging. Mucus plugging with partial to complete airway obstruction was noted bilaterally and treated with therapeutic aspiration prior to beginning of robotic bronchoscopy to allow adequate visualization and accurate lung biopsy and improve patients respiratory status.   The mucosa was : friable at target segment of right lower lobe  Airways were notable for:        exophytic lesions :n       extrinsic compression in the following distributions: n.       Friable mucosa: y       Teacher, music /pigmentation: n   Mucus plugging was treated with therapeutic aspiration utilizing flexible bronchoscopy   Post procedure Diagnosis:   Mucus plugging of tracheobronchial tree      Navigational Bronchoscopy Procedure Findings:   Ion robotic platform utilized for this procedure. Post appropriate planning and registration peripheral navigation was used to visualize target lesion.   Radial endobronchial US  technology utilized during this procedure for confirmation of lesion.  3D Fluoroscopy utilized for tool in lesion confirmation    Target 1 - right lower lobe  Transbronchial cytobrushing x  1 ,   Transbronchial FNA x 3 ,   Transbronchial surgical pathology x 2    BAL was performed at  this location and sent for processing with cytology and microbiology  Due to insufficient tissue via FNA a surgical lung biopsy via surgical forceps was required for adequate pathology tissue evaluation     Media Information  REBUS confirmation -absence of pulsatile flow, +concentric lesion    Needle in lesion confirmation - part solid nodule of RLL   Post procedure diagnosis:  part solid spiculated nodule of right lower lobe      Endobronchial ultrasound assisted hilar and mediastinal lymph node biopsies procedure findings: The fiberoptic bronchoscope was removed and the EBUS scope was introduced. Examination began to evaluate for pathologically enlarged lymph nodes starting on the left  side progressing to the right side.  All lymph node biopsies performed with 21g needle. Lymph node biopsies were sent in cytolite for all stations.   Station 10L - 5mm not biopsied Station 4L- 6mm not biopsied  Station 7 - 7mm not biopsied  Station 4R - 6mm not biopsied Station 10R - 9mm biopsied 3 times    Immediate sampling complications included:NONE immediate  Epinephrine  ZERO ml was used topically  The bronchoscopy was terminated due to completion of the planned procedure and the bronchoscope was removed.   Total dosage of Lidocaine  was 1 mg  Estimated Blood loss: EXPECTED < 5cc.  Complications included:  NONE   Preliminary CXR findings :  IN PROCESS  Disposition: HOME WITH FAMILY   Follow up with Dr. Leevi Cullars in 5 days for result discussion.     Halina Picking MD  [facility] Division of Pulmonary & Critical Care Medicine

## 2024-03-17 NOTE — Transfer of Care (Signed)
 Immediate Anesthesia Transfer of Care Note  Patient: Brenda Beasley  Procedure(s) Performed: VIDEO BRONCHOSCOPY WITH ENDOBRONCHIAL NAVIGATION BRONCHOSCOPY, WITH EBUS  Patient Location: PACU  Anesthesia Type:General  Level of Consciousness: awake, alert , and oriented  Airway & Oxygen Therapy: Patient Spontanous Breathing and Patient connected to face mask oxygen  Post-op Assessment: Report given to RN and Post -op Vital signs reviewed and stable  Post vital signs: stable  Last Vitals:  Vitals Value Taken Time  BP 168/69 03/17/24 09:00  Temp    Pulse 85 03/17/24 09:01  Resp 20 03/17/24 09:01  SpO2 99 % 03/17/24 09:01  Vitals shown include unfiled device data.  Last Pain:  Vitals:   03/17/24 0617  TempSrc: Temporal  PainSc: 0-No pain         Complications: No notable events documented.

## 2024-03-17 NOTE — H&P (Signed)
 PULMONOLOGY         Date: 03/17/2024,   MRN# [id number] Brenda Beasley 09-Oct-1950     AdmissionWeight: 74.8 kg                 CurrentWeight: 74.8 kg   CHIEF COMPLAINT:   Right lower lobe lung nodule with mediastinal adenopathy   HISTORY OF PRESENT ILLNESS   This is 73 year old female with a history of asthma and COPD overlap, cirrhosis of the liver diabetes extensive family history of cancer including breast, colon lung and kidney personal history of varices and polyps iron  deficiency anemia who was seen in outpatient pulmonology clinic with findings of right lower lobe lung nodule which appears irregular and has become more solid from previous chest imaging in August 2025 as well as a chronic 8 mm left lung nodule which appears somewhat unchanged from prior.  The right lower lobe lesion appears suspicious for bronchogenic carcinoma.  We discussed these findings and agreed on definitive diagnosis via. Biopsy.  Patient is here today for bronchoscopic airway examination, followed by flexible bronchoscopy with removal of any noted mucous plugging as well as bronchoalveolar lavage to rule out infectious etiology.  This will be followed by navigational bronchoscopy using Ion platform to perform brushings, FNA and if not enough tissue will consider surgical lung biopsy as well.  Will also examine hilar and mediastinal lymph node stations via endobronchial ultrasound will utilize radial EBUS and 3D fluoroscopy for confirmation of tool in lesion and absence of pulsatile flow prior to biopsy. We discussed procedure in detail. Reviewed risks/complications and benefits with patient, risks include infection, pneumothorax/pneumomediastinum which may require chest tube placement as well as overnight/prolonged hospitalization and possible mechanical ventilation. Other risks include bleeding and very rarely death.  Patient understands risks and wishes to proceed.  Additional questions were  answered, and patient is aware that post procedure patient will be going home with family and may experience cough with possible clots on expectoration as well as phlegm which may last few days as well as hoarseness of voice post intubation and mechanical ventilation.    PAST MEDICAL HISTORY   Past Medical History:  Diagnosis Date   Anxiety    Aortic atherosclerosis    Arthritis    Asthma    Chronic congestive splenomegaly    Chronic pruritus    Cirrhosis (HCC)    Cirrhosis of liver (HCC)    COPD (chronic obstructive pulmonary disease) (HCC)    Diabetes mellitus without complication (HCC)    Dyspnea    Family history of breast cancer    Family history of colon cancer    Family history of kidney cancer    Family history of lung cancer    GERD (gastroesophageal reflux disease)    H/O esophageal varices    Hamartomatous polyp of large intestine (HCC)    Heart murmur    Hematuria, microscopic    History of uterine fibroid    History of ventral hernia    Hyperlipidemia    Hypertension    IBS (irritable bowel syndrome)    IDA (iron  deficiency anemia) 11/13/2020   Nodule of lower lobe of right lung    Osteoporosis with current pathological fracture 11/03/2018   Personal history of tobacco use, presenting hazards to health 09/20/2015   Pneumonia    Portal hypertensive gastropathy (HCC)    Proteinuria    Stroke (HCC)    Tobacco abuse 10/04/2014   Vitamin B 12 deficiency  Vitamin D deficiency    Wears dentures    full upper and lower     SURGICAL HISTORY   Past Surgical History:  Procedure Laterality Date   CATARACT EXTRACTION W/PHACO Right 09/29/2023   Procedure: PHACOEMULSIFICATION, CATARACT, WITH IOL INSERTION 5.23 00:32.6;  Surgeon: Mittie Gaskin, MD;  Location: [facility];  Service: Ophthalmology;  Laterality: Right;   CATARACT EXTRACTION W/PHACO Left 10/13/2023   Procedure: PHACOEMULSIFICATION, CATARACT, WITH IOL INSERTION 4.80, 00:31.5;  Surgeon:  Mittie Gaskin, MD;  Location: [facility];  Service: Ophthalmology;  Laterality: Left;   COLON SURGERY     COLONOSCOPY N/A 01/09/2021   Procedure: COLONOSCOPY;  Surgeon: Onita Elspeth Sharper, DO;  Location: [facility] END[facility]COPY;  Service: Gastroenterology;  Laterality: N/A;   COLONOSCOPY WITH PROPOFOL  N/A 11/23/2018   Procedure: COLONOSCOPY WITH PROPOFOL ;  Surgeon: Toledo, Ladell POUR, MD;  Location: [facility] ENDOSCOPY;  Service: Gastroenterology;  Laterality: N/A;   ESOPHAGOGASTRODUODENOSCOPY N/A 01/09/2021   Procedure: ESOPHAGOGASTRODUODENOSCOPY (EGD);  Surgeon: Onita Elspeth Sharper, DO;  Location: [facility] ENDOSCOPY;  Service: Gastroenterology;  Laterality: N/A;   ESOPHAGOGASTRODUODENOSCOPY N/A 07/02/2022   Procedure: ESOPHAGOGASTRODUODENOSCOPY (EGD);  Surgeon: Onita Elspeth Sharper, DO;  Location: [facility] ENDOSCOPY;  Service: Gastroenterology;  Laterality: N/A;   ESOPHAGOGASTRODUODENOSCOPY (EGD) WITH PROPOFOL  N/A 11/23/2018   Procedure: ESOPHAGOGASTRODUODENOSCOPY (EGD) WITH PROPOFOL ;  Surgeon: Toledo, Ladell POUR, MD;  Location: [facility] ENDOSCOPY;  Service: Gastroenterology;  Laterality: N/A;   ESOPHAGOGASTRODUODENOSCOPY (EGD) WITH PROPOFOL  N/A 01/17/2021   Procedure: ESOPHAGOGASTRODUODENOSCOPY (EGD) WITH PROPOFOL ;  Surgeon: Unk Corinn Skiff, MD;  Location: [facility] ENDOSCOPY;  Service: Gastroenterology;  Laterality: N/A;   HERNIA REPAIR     PARTIAL COLECTOMY     TONSILLECTOMY     VENTRAL HERNIA REPAIR       FAMILY HISTORY   Family History  Problem Relation Age of Onset   Diabetes Mother    Diabetes Father    Diabetes Sister    Diabetes Maternal Grandmother    Diabetes Paternal Grandmother      SOCIAL HISTORY   Social History   Tobacco Use   Smoking status: Former    Current packs/day: 0.00    Average packs/day: 1 pack/day for 52.0 years (52.0 ttl pk-yrs)    Types: Cigarettes    Start date: 06/12/1966    Quit date: 06/12/2018    Years since quitting: 5.7   Smokeless tobacco: Never   Vaping Use   Vaping status: Never Used  Substance Use Topics   Alcohol use: No    Alcohol/week: 0.0 standard drinks of alcohol   Drug use: Not Currently     MEDICATIONS    Home Medication:    Current Medication:  Current Facility-Administered Medications:    0.9 %  sodium chloride  infusion, , Intravenous, Continuous, Elnor Dorise BRAVO, NP, Last Rate: 100 mL/hr at 03/17/24 0649, New Bag at 03/17/24 0649    ALLERGIES   Molds & smuts, Other, Penicillins, Pioglitazone, Byetta 10 mcg pen [exenatide], Dapagliflozin, Jardiance [empagliflozin], Victoza [liraglutide], Sulfa antibiotics, and Sulfasalazine     REVIEW OF SYSTEMS    Review of Systems:  Gen:  Denies  fever, sweats, chills weigh loss  HEENT: Denies blurred vision, double vision, ear pain, eye pain, hearing loss, nose bleeds, sore throat Cardiac:  No dizziness, chest pain or heaviness, chest tightness,edema Resp:   reports dyspnea chronically  Gi: Denies swallowing difficulty, stomach pain, nausea or vomiting, diarrhea, constipation, bowel incontinence Gu:  Denies bladder incontinence, burning urine Ext:   Denies Joint pain, stiffness or swelling Skin: Denies  skin rash, easy bruising or bleeding or hives Endoc:  Denies polyuria, polydipsia , polyphagia or weight change Psych:   Denies depression, insomnia or hallucinations   Other:  All other systems negative   VS: BP (!) 183/89   Pulse 82   Temp (!) 97 F (36.1 C) (Temporal)   Resp 16   Ht 4' 11 (1.499 m)   Wt 74.8 kg   SpO2 98%   BMI 33.33 kg/m      PHYSICAL EXAM    GENERAL:NAD, no fevers, chills, no weakness no fatigue HEAD: Normocephalic, atraumatic.  EYES: Pupils equal, round, reactive to light. Extraocular muscles intact. No scleral icterus.  MOUTH: Moist mucosal membrane. Dentition intact. No abscess noted.  EAR, NOSE, THROAT: Clear without exudates. No external lesions.  NECK: Supple. No thyromegaly. No nodules. No JVD.  PULMONARY:  decreased breath sounds with mild rhonchi worse at bases bilaterally.  CARDIOVASCULAR: S1 and S2. Regular rate and rhythm. No murmurs, rubs, or gallops. No edema. Pedal pulses 2+ bilaterally.  GASTROINTESTINAL: Soft, nontender, nondistended. No masses. Positive bowel sounds. No hepatosplenomegaly.  MUSCULOSKELETAL: No swelling, clubbing, or edema. Range of motion full in all extremities.  NEUROLOGIC: Cranial nerves II through XII are intact. No gross focal neurological deficits. Sensation intact. Reflexes intact.  SKIN: No ulceration, lesions, rashes, or cyanosis. Skin warm and dry. Turgor intact.  PSYCHIATRIC: Mood, affect within normal limits. The patient is awake, alert and oriented x 3. Insight, judgment intact.       IMAGING   Study Result  Narrative & Impression  CLINICAL DATA:  73 year old female with COPD. Former smoker. New 8 mm ground-glass opacity in the right lower lobe in February this year.   EXAM: CT CHEST WITHOUT CONTRAST   TECHNIQUE: Multidetector CT imaging of the chest was performed following the standard protocol without IV contrast.   RADIATION DOSE REDUCTION: This exam was performed according to the departmental dose-optimization program which includes automated exposure control, adjustment of the mA and/or kV according to patient size and/or use of iterative reconstruction technique.   COMPARISON:  Conventional noncontrast chest CT 06/29/2023. Low-dose screening chest CT 04/08/2022 and earlier   Abdomen ultrasound 12/28/2023.   FINDINGS: Cardiovascular: Extensive Calcified aortic atherosclerosis. Calcified coronary artery atherosclerosis and/or stent. Heart size remains within normal limits. No pericardial effusion.   Chronic tortuosity of the descending thoracic aorta (37 mm) remain stable since 2022, does not meet size criteria for fusiform aneurysm.   Mediastinum/Nodes: Negative for mediastinal mass or lymphadenopathy.   Lungs/Pleura: Chronic  centrilobular emphysema. Major airways are patent. Lung volumes appear not significantly changed since 2022. Right lower lobe irregular 7 mm nodule on series 4, image 103 is new since 2022, corresponds to the February finding and appears more solid since that time. Size is stable.   Other small scattered pulmonary nodules, occasional small calcified granulomas in both lungs appears stable since 02/19/2021, including chronic 8 mm left lung nodule located near the hilum on series 4, image 54 (series 3, image 118 on 02/19/2021).   No pleural effusion. No consolidation. No active lung inflammation identified.   Upper Abdomen: Positive for nodular liver contour suspicious for cirrhosis (series 2, image 127) and chronic splenomegaly which is partially visible. Negative visible noncontrast pancreas, adrenal glands and bowel. Stable visible right kidney.   Musculoskeletal: Stable visualized osseous structures.   IMPRESSION: 1. Chronic Emphysema (ICD10-J43.9) with right lower lobe lung 8 mm nodule described on 06/29/2023 persists and appears more solid (series 4, image 103), is  new since 02/19/2021, and is suspicious for bronchogenic carcinoma. Recommend referral to [facility]).   2. Numerous other chronic lung nodules remain stable since 2022, including an 8 mm left lung nodule located near the hilum (series 4, image 54).   3. Aortic Atherosclerosis (ICD10-I70.0). Stable chronically tortuous descending thoracic aorta. Coronary artery atherosclerosis.   4. Cirrhosis, chronic Splenomegaly.     Electronically Signed   By: VEAR Hurst M.D.   On: 02/10/2024 13:57    ASSESSMENT/PLAN   Right lower lobe irregular semisolid nodule Suspicious for bronchogenic carcinoma               The right lower lobe lesion appears suspicious for bronchogenic carcinoma.  We discussed these findings and agreed on definitive diagnosis via. Biopsy.  Patient is here  today for bronchoscopic airway examination, followed by flexible bronchoscopy with removal of any noted mucous plugging as well as bronchoalveolar lavage to rule out infectious etiology.  This will be followed by navigational bronchoscopy using Ion platform to perform brushings, FNA and if not enough tissue will consider surgical lung biopsy as well.  Will also examine hilar and mediastinal lymph node stations via endobronchial ultrasound will utilize radial EBUS and 3D fluoroscopy for confirmation of tool in lesion and absence of pulsatile flow prior to biopsy. We discussed procedure in detail. Reviewed risks/complications and benefits with patient, risks include infection, pneumothorax/pneumomediastinum which may require chest tube placement as well as overnight/prolonged hospitalization and possible mechanical ventilation. Other risks include bleeding and very rarely death.  Patient understands risks and wishes to proceed.  Additional questions were answered, and patient is aware that post procedure patient will be going home with family and may experience cough with possible clots on expectoration as well as phlegm which may last few days as well as hoarseness of voice post intubation and mechanical ventilation.             Thank you for allowing me to participate in the care of this patient.   Patient/Family are satisfied with care plan and all questions have been answered.    Provider disclosure: Patient with at least one acute or chronic illness or injury that poses a threat to life or bodily function and is being managed actively during this encounter.  All of the below services have been performed independently by signing provider:  review of prior documentation from internal and or external health records.  Review of previous and current lab results.  Interview and comprehensive assessment during patient visit today. Review of current and previous chest radiographs/CT scans. Discussion of  management and test interpretation with health care team and patient/family.   This document was prepared using Dragon voice recognition software and may include unintentional dictation errors.     Cire Clute, M.D.  Division of Pulmonary & Critical Care Medicine

## 2024-03-17 NOTE — Anesthesia Procedure Notes (Signed)
 Procedure Name: Intubation Date/Time: 03/17/2024 7:43 AM  Performed by: Elly Pfeiffer, CRNAPre-anesthesia Checklist: Patient identified, Emergency Drugs available, Suction available and Patient being monitored Patient Re-evaluated:Patient Re-evaluated prior to induction Oxygen Delivery Method: Circle system utilized Preoxygenation: Pre-oxygenation with 100% oxygen Induction Type: IV induction Ventilation: Mask ventilation without difficulty Laryngoscope Size: McGrath and 4 Grade View: Grade I Tube type: Oral Tube size: 8.5 mm Number of attempts: 1 Airway Equipment and Method: Stylet and Oral airway Placement Confirmation: ETT inserted through vocal cords under direct vision, positive ETCO2 and breath sounds checked- equal and bilateral Secured at: 22 cm Tube secured with: Tape Dental Injury: Teeth and Oropharynx as per pre-operative assessment

## 2024-03-17 NOTE — Anesthesia Preprocedure Evaluation (Signed)
 Anesthesia Evaluation  Patient identified by MRN, date of birth, ID band Patient awake    Reviewed: Allergy & Precautions, NPO status , [name] Chart, lab work & pertinent test results  History of Anesthesia Complications Negative for: history of anesthetic complications  Airway Mallampati: III  TM Distance: >3 FB Neck ROM: full    Dental  (+) Edentulous Lower, Edentulous Upper   Pulmonary shortness of breath, asthma , COPD,  COPD inhaler, former smoker   Pulmonary exam normal        Cardiovascular hypertension, On Medications negative cardio ROS Normal cardiovascular exam     Neuro/Psych  PSYCHIATRIC DISORDERS Anxiety      Neuromuscular disease CVA    GI/Hepatic ,GERD  ,,(+) Cirrhosis         Endo/Other  negative endocrine ROSdiabetes, Type 2, Insulin  Dependent    Renal/GU      Musculoskeletal   Abdominal   Peds  Hematology  (+) Blood dyscrasia, anemia   Anesthesia Other Findings Past Medical History: No date: Anxiety No date: Aortic atherosclerosis No date: Arthritis No date: Asthma No date: Chronic congestive splenomegaly No date: Chronic pruritus No date: Cirrhosis (HCC) No date: Cirrhosis of liver (HCC) No date: COPD (chronic obstructive pulmonary disease) (HCC) No date: Diabetes mellitus without complication (HCC) No date: Dyspnea No date: Family history of breast cancer No date: Family history of colon cancer No date: Family history of kidney cancer No date: Family history of lung cancer No date: GERD (gastroesophageal reflux disease) No date: H/O esophageal varices No date: Hamartomatous polyp of large intestine (HCC) No date: Heart murmur No date: Hematuria, microscopic No date: History of uterine fibroid No date: History of ventral hernia No date: Hyperlipidemia No date: Hypertension No date: IBS (irritable bowel syndrome) 11/13/2020: IDA (iron  deficiency anemia) No date: Nodule of  lower lobe of right lung 11/03/2018: Osteoporosis with current pathological fracture 09/20/2015: Personal history of tobacco use, presenting hazards to  health No date: Pneumonia No date: Portal hypertensive gastropathy (HCC) No date: Proteinuria No date: Stroke (HCC) 10/04/2014: Tobacco abuse No date: Vitamin B 12 deficiency No date: Vitamin D deficiency No date: Wears dentures     Comment:  full upper and lower  Past Surgical History: 09/29/2023: CATARACT EXTRACTION W/PHACO; Right     Comment:  Procedure: PHACOEMULSIFICATION, CATARACT, WITH IOL               INSERTION 5.23 00:32.6;  Surgeon: Mittie Gaskin,               MD;  Location: [facility];  Service:               Ophthalmology;  Laterality: Right; 10/13/2023: CATARACT EXTRACTION W/PHACO; Left     Comment:  Procedure: PHACOEMULSIFICATION, CATARACT, WITH IOL               INSERTION 4.80, 00:31.5;  Surgeon: Mittie Gaskin,               MD;  Location: [facility];  Service:               Ophthalmology;  Laterality: Left; No date: COLON SURGERY 01/09/2021: COLONOSCOPY; N/A     Comment:  Procedure: COLONOSCOPY;  Surgeon: Onita Elspeth Sharper,              DO;  Location: [facility] END[facility];  Service:               Gastroenterology;  Laterality: N/A; 11/23/2018: COLONOSCOPY WITH PROPOFOL ; N/A  Comment:  Procedure: COLONOSCOPY WITH PROPOFOL ;  Surgeon: [place],               Ladell POUR, MD;  Location: [facility] ENDOSCOPY;  Service:               Gastroenterology;  Laterality: N/A; 01/09/2021: ESOPHAGOGASTRODUODENOSCOPY; N/A     Comment:  Procedure: ESOPHAGOGASTRODUODENOSCOPY (EGD);  Surgeon:               Onita Elspeth Sharper, DO;  Location: [facility];                Service: Gastroenterology;  Laterality: N/A; 07/02/2022: ESOPHAGOGASTRODUODENOSCOPY; N/A     Comment:  Procedure: ESOPHAGOGASTRODUODENOSCOPY (EGD);  Surgeon:               Onita Elspeth Sharper, DO;  Location: [facility] ENDOSCOPY;                 Service: Gastroenterology;  Laterality: N/A; 11/23/2018: ESOPHAGOGASTRODUODENOSCOPY (EGD) WITH PROPOFOL ; N/A     Comment:  Procedure: ESOPHAGOGASTRODUODENOSCOPY (EGD) WITH               PROPOFOL ;  Surgeon: Toledo, Ladell POUR, MD;  Location:               [facility] ENDOSCOPY;  Service: Gastroenterology;  Laterality:               N/A; 01/17/2021: ESOPHAGOGASTRODUODENOSCOPY (EGD) WITH PROPOFOL ; N/A     Comment:  Procedure: ESOPHAGOGASTRODUODENOSCOPY (EGD) WITH               PROPOFOL ;  Surgeon: Unk Corinn Skiff, MD;  Location:               [facility] ENDOSCOPY;  Service: Gastroenterology;  Laterality:               N/A; No date: HERNIA REPAIR No date: PARTIAL COLECTOMY No date: TONSILLECTOMY No date: VENTRAL HERNIA REPAIR  BMI    Body Mass Index: 33.33 kg/m      Reproductive/Obstetrics negative OB ROS                              Anesthesia Physical Anesthesia Plan  ASA: 3  Anesthesia Plan: General ETT   Post-op Pain Management: Minimal or no pain anticipated   Induction: Intravenous  PONV Risk Score and Plan: 3 and Ondansetron , Dexamethasone  and Treatment may vary due to age or medical condition  Airway Management Planned: Oral ETT  Additional Equipment:   Intra-op Plan:   Post-operative Plan: Extubation in OR  Informed Consent: I have reviewed the patients History and Physical, chart, labs and discussed the procedure including the risks, benefits and alternatives for the proposed anesthesia with the patient or authorized representative who has indicated his/her understanding and acceptance.     Dental Advisory Given  Plan Discussed with: Anesthesiologist, CRNA and Surgeon  Anesthesia Plan Comments: (Patient consented for risks of anesthesia including but not limited to:  - adverse reactions to medications - damage to eyes, teeth, lips or other oral mucosa - nerve damage due to positioning  - sore throat or hoarseness - Damage to heart, brain,  nerves, lungs, other parts of body or loss of life  Patient voiced understanding and assent.)         Anesthesia Quick Evaluation

## 2024-03-18 ENCOUNTER — Encounter: Payer: Self-pay | Admitting: Pulmonary Disease

## 2024-03-19 LAB — CULTURE, BAL-QUANTITATIVE W GRAM STAIN: Culture: NO GROWTH

## 2024-03-20 ENCOUNTER — Encounter: Payer: Self-pay | Admitting: Pulmonary Disease

## 2024-03-21 LAB — SURGICAL PATHOLOGY

## 2024-03-21 LAB — CYTOLOGY - NON PAP

## 2024-04-03 NOTE — Progress Notes (Signed)
 DIVISION OF PULMONARY AND CRITICAL CARE MEDICINE                              FOLLOW UP ENCOUNTER     Chief complaint: Right lower lobe lung nodule with history of COPD  History of Present Illness Brenda Beasley is a 73 year old female who presents for follow-up of a right lower lobe lung nodule.  She underwent a biopsy of the right lower lobe lung nodule. Additional brushings and washings of the area were performed. A biopsy of a lymph node in the right lower lobe was performed.  Her breathing has been stable, even during a recent trip to [facility], despite experiencing unusual weather changes. Her lungs typically react to fluctuating temperatures, but she did not experience any breathing difficulties during this trip.  Previously, she experienced a sensation of 'a piece of ice' in the middle of her chest while breathing, but this symptom has not recurred since her last procedure. She was treated with antibiotics and prednisone , which may have contributed to the resolution of this symptom.  She experienced mucus production following the procedure, which resolved after a few days. The mucus was sent for microbiological analysis.  Her current medications include an albuterol  inhaler and a nebulizer, both used as needed. She also uses Flonase  nasal spray as needed. She has medications for acid reflux, diabetes, and blood pressure management.   Past Medical History:   Past Medical History:  Diagnosis Date  . Age-related osteoporosis without current pathological fracture 11/13/2016   By DEXA 6/18  . B12 deficiency   . Chronic pruritus   . COPD (chronic obstructive pulmonary disease) (CMS/HHS-HCC)   . Diabetes mellitus type 2, uncomplicated (CMS/HHS-HCC)    adult onset  . Drug-induced urticaria    after anesthesia  . GERD (gastroesophageal reflux disease)    EGD 2/04 with esophagitis, Schatzki ring (dilated), gastritis, duodenitis.  [name] History of  uterine fibroid   . Hyperlipidemia   . Hypertension   . IBS (irritable bowel syndrome)    per patient report  . Microscopic hematuria    by urinalysis December 2003.  Cystoscopy revealing cystitis cystica, on suppressive antibiotics.  . Other cirrhosis of liver (CMS/HHS-HCC) 06/30/2018   Noted on CT scan 2/20: Fatty liver disease evident, likely the source  . Proteinuria 03/05/2015  . Stroke (CMS/HHS-HCC)    Patient said she was told she had mini strokes in the past  . Ventral hernia    by CT 8/13; no bowel seen within this hernia.  . Vitamin D deficiency     Past Surgical History:   Past Surgical History:  Procedure Laterality Date  . Colon polyps  2008   Colonoscopy 2008 revealing large polyps requiring partial colectomy through Dr. Unknown Sharps, 2008  . LAPAROSCOPIC COLECTOMY PARTIAL W/ANASTAMOSIS Right 2008   Dr. Sharps  . COLONOSCOPY  12/29/2006   09/19/1997, 07/05/2002. Adenomatous polyps.  [name] HERNIA REPAIR  2011   [place], Dr. Unknown Sharps  . REPAIR INCISIONAL/VENTRAL HERNIA  11/2013   Dr. Sharps  . COLONOSCOPY  11/23/2018   Negative colon biopsies/PHx CP/Repeat 67yrs/TKT  . EGD  11/23/2018   Esophageal stenosis/Portal hypertensive gastropathy/Repeat 17yrs/TKT  . COLONOSCOPY  01/09/2021   Hyperplastic polyp/Proctitis/PHx CP/Will decided if  repeat needed at Office visit/SMR  . EGD  01/09/2021   Gastric polyp/Will decided if repeat needed at Office visit/SMR  . EGD  01/17/2021   [place]  . EGD @ [facility]  07/02/2022   Esophageal varices/Harmartomtous polyp/Repeat 60yrs/SMR  . EGD  089/13/2008   06/22/1997, 07/05/2002, 07/23/2005. No repeat per Dr. Viktoria.  . TONSILLECTOMY      Allergies:   Allergies  Allergen Reactions  . Mold Extracts Shortness Of Breath  . Other Hives and Shortness Of Breath    Cats hives Dust causes sneezing Affects breathing Tape-unknown Cats hives Dust causes sneezing Affects breathing Tape-unknown    . Penicillins Hives and Swelling     Throat swell Throat swell    . Pioglitazone Palpitations and Other (See Comments)    Chest pain, numbness in fingers,  Chest pain, numbness in fingers,     . Exenatide Other (See Comments)    Severe stomach pains Severe stomach pains    . Farxiga [Dapagliflozin] Other (See Comments)    Intolerant/dyspnea  . Jardiance [Empagliflozin] Other (See Comments)    Has trouble urinating with this  . Liraglutide Other (See Comments)    Severe stomach pains    . Sulfa (Sulfonamide Antibiotics) Rash  . Sulfasalazine Rash    Current Medications:   Prior to Admission medications  Medication Sig Taking? Last Dose  albuterol  MDI, PROVENTIL , VENTOLIN , PROAIR , HFA 90 mcg/actuation inhaler Inhale 2 inhalations into the lungs every 6 (six) hours as needed for Wheezing Yes Taking  amLODIPine (NORVASC) 2.5 MG tablet TAKE 1 TABLET BY MOUTH EVERY DAY Yes Taking  ascorbic acid, vitamin C, (VITAMIN C) 500 MG tablet Take 1,000 mg by mouth 2 (two) times daily Yes Taking  blood glucose diagnostic (ACCU-CHEK AVIVA PLUS TEST STRP) test strip Test 4 times a day Yes Taking  cholecalciferol (VITAMIN D3) 5,000 unit capsule Take by mouth 2 (two) times daily Yes Taking  fluticasone  propionate (FLONASE ) 50 mcg/actuation nasal spray SPRAY 2 SPRAYS INTO EACH NOSTRIL EVERY DAY Yes Taking  gabapentin (NEURONTIN) 100 MG capsule Take 100 mg by mouth as needed Yes Taking  glipiZIDE (GLUCOTROL) 5 MG tablet Take 1 tablet (5 mg total) by mouth 2 (two) times daily before meals Yes Taking  guaiFENesin (MUCINEX) 600 mg SR tablet Take 1 tablet (600 mg total) by mouth every 12 (twelve) hours as needed for Cough or Congestion Yes Taking  hydroCHLOROthiazide  (HYDRODIURIL ) 25 MG tablet TAKE 1 TABLET BY MOUTH EVERY DAY Yes Taking  insulin  DEGLUDEC (TRESIBA FLEXTOUCH) pen injector (concentration 200 units/mL) Inject 42 Units subcutaneously once daily Yes Taking  ipratropium-albuteroL  (DUO-NEB) nebulizer solution Inhale into the lungs  Yes Taking  lisinopriL  (ZESTRIL ) 40 MG tablet TAKE 1/2 TABLETS BY MOUTH ONCE DAILY. Yes Taking  lovastatin (MEVACOR) 40 MG tablet TAKE 2 TABLETS (80 MG TOTAL) BY MOUTH NIGHTLY Yes Taking  magnesium  oxide (MAG-OX) 400 mg (241.3 mg magnesium ) tablet Take 500 mg by mouth 2 (two) times daily Yes Taking  metFORMIN (GLUCOPHAGE) 1000 MG tablet TAKE 1 TABLET BY MOUTH TWICE A DAY WITH MEALS Yes Taking  omeprazole (PRILOSEC) 20 MG DR capsule TAKE 1 CAPSULE BY MOUTH TWICE A DAY BEFORE MEALS Yes Taking  pen needle, diabetic (BD NANO 2ND GEN PEN NEEDLE) 32 gauge x 5/32 Ndle USE 1 EACH ONCE DAILY Yes Taking  predniSONE  (DELTASONE ) 5 MG tablet Take 1 tablet (5 mg total) by mouth once daily for 90 days Yes Taking  budesonide (PULMICORT) 0.5 mg/2 mL nebulizer solution Take 2 mLs (  0.5 mg total) by nebulization 2 (two) times daily Diagnosis code. 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 Diabetes Mother    . High blood pressure (Hypertension) Mother    . Heart disease Mother    . Lung disease Mother    . COPD Mother    . Diabetes Father    . High blood pressure (Hypertension) Father    . Heart disease Father    . Myocardial Infarction (Heart attack) Father         cause of death MI  . Lung disease Father         Black lung  . Diabetes Sister    . Diabetes Maternal Grandmother    . Diabetes Paternal Grandmother    . Heart disease Paternal Grandmother    . Goiter Maternal Grandfather    . Heart disease Paternal Grandfather      Social History:   Social History   Socioeconomic History  . Marital status: Widowed  Tobacco Use  . Smoking status: Former    Current packs/day: 0.00    Average packs/day: 1 pack/day for 51.0 years (51.0 ttl pk-yrs)    Types: Cigarettes    Start date: 06/13/1967     Quit date: 06/12/2018    Years since quitting: 5.8  . Smokeless tobacco: Never  Vaping Use  . Vaping status: Never Used  Substance and Sexual Activity  . Alcohol use: No    Alcohol/week: 0.0 standard drinks of alcohol  . Drug use: Defer  . Sexual activity: Defer    Birth control/protection: Post-menopausal   Social Drivers of Health   Financial Resource Strain: Low Risk  (07/26/2023)   Overall Financial Resource Strain (CARDIA)   . Difficulty of Paying Living Expenses: Not hard at all  Food Insecurity: No Food Insecurity (07/26/2023)   Hunger Vital Sign   . Worried About Programme Researcher, Broadcasting/film/video in the Last Year: Never true   . Ran Out of Food in the Last Year: Never true  Transportation Needs: No Transportation Needs (07/26/2023)   PRAPARE - Transportation   . Lack of Transportation (Medical): No   . Lack of Transportation (Non-Medical): No  Housing Stability: Low Risk  (07/26/2023)   Housing Stability Vital Sign   . Unable to Pay for Housing in the Last Year: No   . Number of Times Moved in the Last Year: 0   . Homeless in the Last Year: No    Review of Systems:   A 10 point review of systems is negative, except for the pertinent positives and negatives detailed in the HPI.  Vitals:   Vitals:   04/03/24 0759  BP: (!) 179/76  BP Location: Right forearm  Patient Position: Sitting  BP Cuff Size: Adult  Pulse: 70  SpO2: 98%  Weight: 75.7 kg (166 lb 12.8 oz)  Height: 152.4 cm (5')     Body mass index is 32.58 kg/m.  Physical Exam:   Physical Exam Vitals and nursing note reviewed.  Constitutional:      General: in no acute distress.    Appearance: Normal appearance. Is not ill-appearing, toxic-appearing or diaphoretic.  HENT:     Head: Normocephalic and atraumatic.     Right Ear: External ear  normal.     Left Ear: External ear normal.  Eyes:     General:        Right eye: No discharge.        Left eye: No discharge.     Extraocular Movements: Extraocular movements  intact.     Pupils: Pupils are equal, round, and reactive to light.  Cardiovascular:     Rate and Rhythm: Normal rate and regular rhythm.     Pulses: Normal pulses.     Heart sounds: Normal heart sounds. No murmur heard.    No friction rub. No gallop.  Abdominal:     General: Bowel sounds are normal.  Skin:    General: Skin is warm and dry.     Capillary Refill: Capillary refill takes less than 2 seconds.  Neurological:     Mental Status: Patient is alert.     Lab and Imaging Results:   Results LABS Bronchoalveolar Lavage: No organisms on Gram stain or culture  PATHOLOGY Lung Nodule Biopsy: No cancer cells Bronchial Brushings: No cancer cells Bronchoalveolar Lavage: No cancer cells Lymph Node Biopsy: No cancer cells CT SUPER D CHEST WO MONARCH PILOT Anatomical Region Laterality Modality Chest -- Computed Tomography Narrative CLINICAL DATA:  Multiple lung nodules, right lower lobe pulmonary nodule * Tracking Code: BO *  EXAM: CT CHEST WITHOUT CONTRAST  TECHNIQUE: Multidetector CT imaging of the chest was performed using thin slice collimation for electromagnetic bronchoscopy planning purposes, without intravenous contrast.  RADIATION DOSE REDUCTION: This exam was performed according to the departmental dose-optimization program which includes automated exposure control, adjustment of the mA and/or kV according to patient size and/or use of iterative reconstruction technique.  COMPARISON:  02/04/2024 04/08/2022  FINDINGS: Cardiovascular: Aortic atherosclerosis. 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 Normal heart size. Left and right coronary artery calcifications. No pericardial effusion.  Mediastinum/Nodes: No enlarged mediastinal, hilar, or axillary lymph nodes. Thyroid gland, trachea, and esophagus demonstrate no significant findings.  Lungs/Pleura: Mild centrilobular emphysema and  diffuse bilateral bronchial wall thickening. Unchanged ground-glass nodule in the peripheral right lower lobe measuring 0.8 cm (series 4, image 95). Numerous additional small ground-glass nodules, for example a 0.3 cm nodule of the peripheral right upper lobe (series 4, image 67) as well as a solid nodule in the perihilar left upper lobe measuring 0.5 cm. These latter nodules are unchanged dating back to 2023 and benign. No pleural effusion or pneumothorax.  Upper Abdomen: No acute abnormality. Coarse, nodular cirrhotic morphology of the liver. Partially imaged splenomegaly.  Musculoskeletal: No chest wall abnormality. No acute osseous findings.  IMPRESSION: 1. Unchanged ground-glass nodule in the peripheral right lower lobe measuring 0.8 cm. This is possibly infectious or inflammatory but worrisome for adenocarcinoma given persistence. This is however most likely of insufficient size and solid character for characterization by PET-CT, particularly given basilar location. Recommend continued imaging surveillance at 1 year. This could be accomplished by low-dose CT lung cancer screening on an ongoing annual basis if indicated by patient smoking history or other high risk factors for lung cancer. 2. Numerous additional small ground-glass nodules and solid nodules, unchanged dating back to 2023 and benign. 3. Emphysema and diffuse bilateral bronchial wall thickening. 4. Coronary artery disease. 5. Cirrhosis and splenomegaly.  Aortic Atherosclerosis (ICD10-I70.0) and Emphysema (ICD10-J43.9).   Electronically Signed   By: Marolyn JONETTA Jaksch M.D.   On: 03/17/2024 07:30 Procedure Note   Assessment and Plan:  Diagnoses and all orders for this visit:  Chronic obstructive pulmonary disease, unspecified COPD type (CMS/HHS-HCC) -     CT chest with contrast with 3D MIPS protocol; Future  History of bronchoscopy  Right lower lobe pulmonary nodule -     CT chest with contrast with 3D  MIPS protocol; Future    Assessment & Plan Right lower lobe pulmonary nodule under surveillance The right lower lobe pulmonary nodule was biopsied, and no cancer cells were found in the biopsy, brushings, or washings. The nodule will continue to be monitored for any changes. - Ordered follow-up chest imaging in six months to monitor the nodule.  Chronic obstructive pulmonary disease (COPD) She reports good breathing and no recent episodes of chest discomfort described as a piece of ice. Previous treatment with antibiotics and prednisone  appears effective. - Continue current COPD management plan.  Postprocedural state following lung biopsy and bronchoalveolar lavage Postprocedural state is stable. Mucus plugging of tracheobronchial tree has been treated with therapeutic aspiration.  No signs of infection were found in the bronchoalveolar lavage washings.  - Continue current medications as needed.    I spent 41 minutes in both face-to-face and non-face-to-face activities including reviewing history, performing an exam and evaluation, entering clinical information EHR, interpreting results, counseling patient/family/caregiver, reviewing x-rays/CT scans/MRI/labs/echocardiography/PFTs, ordering meds/tests/procedures, referring and communicating with consulting healthcare professionals and care coordination. This does not include time spent with staff.    The patient and/or family voices understanding of the plans. All questions and concerns were answered. The patient and /or family was instructed to call if the patient needs to be seen sooner. Thank you for allowing me to participate in the care of this patient. Do not hesitate to contact me via Epic or by calling our office at [phone] with any questions or concerns.     This note has been created using dictation software tool and any typographical errors are purely unintentional.  Patient received an After Visit Summary

## 2024-04-24 ENCOUNTER — Encounter: Payer: Self-pay | Admitting: Radiation Oncology

## 2024-04-24 ENCOUNTER — Ambulatory Visit
Admission: RE | Admit: 2024-04-24 | Discharge: 2024-04-24 | Attending: Radiation Oncology | Admitting: Radiation Oncology

## 2024-04-24 VITALS — BP 158/87 | HR 84 | Temp 97.6°F | Resp 16 | Wt 160.0 lb

## 2024-04-24 DIAGNOSIS — R911 Solitary pulmonary nodule: Secondary | ICD-10-CM

## 2024-04-24 NOTE — Consult Note (Signed)
 NEW PATIENT EVALUATION  Name: Brenda Beasley  MRN: [id number]  Date:   04/24/2024     DOB: 01/18/1951   This 73 y.o. 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  REFERRING PHYSICIAN: Fernande Ophelia JINNY DOUGLAS, MD  CHIEF COMPLAINT:  Chief Complaint  Patient presents with   Lung Cancer    DIAGNOSIS: The encounter diagnosis was Lung nodule.   PREVIOUS INVESTIGATIONS:  CT scans reviewed Clinical notes reviewed Cytology and surgical pathology reviewed  HPI: Patient is a 73 year old female who has been followed by Dr. Aleskerov for a small right lower lobe peripheral lesion with characteristics of a well-differentiated adenocarcinoma.  It has been followed by since sometime by pulmonology now shows a peripheral right lower lobe 0.8 cm lesion worrisome for adenocarcinoma.  She also has numerous additional small ground glass nodules unchanged in the back to 23.  She does have emphysema and diffuse bilateral bronchial wall thickening.  He she underwent a bronchoscopy with cytology showing atypical cells no definitive malignancy on my review of her pathology.  Patient is fairly asymptomatic has a mild nonproductive cough no significant hemoptysis chest tightness.  She is seen today for radiation oncology opinion.  PLANNED TREATMENT REGIMEN: Observation at this time with repeat scans in 4 months and possible SBRT  PAST MEDICAL HISTORY:  has a past medical history of Anxiety, Aortic atherosclerosis, Arthritis, Asthma, Chronic congestive splenomegaly, Chronic pruritus, Cirrhosis (HCC), Cirrhosis of liver (HCC), COPD (chronic obstructive pulmonary disease) (HCC), Diabetes mellitus without complication (HCC), Dyspnea, Family history of breast cancer, Family history of colon cancer, Family history of kidney cancer, Family history of lung cancer, GERD (gastroesophageal reflux disease), H/O esophageal  varices, Hamartomatous polyp of large intestine (HCC), Heart murmur, Hematuria, microscopic, History of uterine fibroid, History of ventral hernia, Hyperlipidemia, Hypertension, IBS (irritable bowel syndrome), IDA (iron  deficiency anemia) (11/13/2020), Nodule of lower lobe of right lung, Osteoporosis with current pathological fracture (11/03/2018), Personal history of tobacco use, presenting hazards to health (09/20/2015), Pneumonia, Portal hypertensive gastropathy (HCC), Proteinuria, Stroke (HCC), Tobacco abuse (10/04/2014), Vitamin B 12 deficiency, Vitamin D deficiency, and Wears dentures.    PAST SURGICAL HISTORY:  Past Surgical History:  Procedure Laterality Date   CATARACT EXTRACTION W/PHACO Right 09/29/2023   Procedure: PHACOEMULSIFICATION, CATARACT, WITH IOL INSERTION 5.23 00:32.6;  Surgeon: Mittie Gaskin, MD;  Location: [facility];  Service: Ophthalmology;  Laterality: Right;   CATARACT EXTRACTION W/PHACO Left 10/13/2023   Procedure: PHACOEMULSIFICATION, CATARACT, WITH IOL INSERTION 4.80, 00:31.5;  Surgeon: Mittie Gaskin, MD;  Location: [facility];  Service: Ophthalmology;  Laterality: Left;   COLON SURGERY     COLONOSCOPY N/A 01/09/2021   Procedure: COLONOSCOPY;  Surgeon: Onita Elspeth Sharper, DO;  Location: [facility] END[facility];  Service: Gastroenterology;  Laterality: N/A;   COLONOSCOPY WITH PROPOFOL  N/A 11/23/2018   Procedure: COLONOSCOPY WITH PROPOFOL ;  Surgeon: Toledo, Ladell POUR, MD;  Location: [facility] ENDOSCOPY;  Service: Gastroenterology;  Laterality: N/A;   ESOPHAGOGASTRODUODENOSCOPY N/A 01/09/2021   Procedure: ESOPHAGOGASTRODUODENOSCOPY (EGD);  Surgeon: Onita Elspeth Sharper, DO;  Location: [facility] ENDOSCOPY;  Service: Gastroenterology;  Laterality: N/A;   ESOPHAGOGASTRODUODENOSCOPY N/A 07/02/2022   Procedure: ESOPHAGOGASTRODUODENOSCOPY (EGD);  Surgeon: Onita Elspeth Sharper, DO;  Location: [facility] ENDOSCOPY;  Service: Gastroenterology;  Laterality: N/A;    ESOPHAGOGASTRODUODENOSCOPY (EGD) WITH PROPOFOL  N/A 11/23/2018   Procedure: ESOPHAGOGASTRODUODENOSCOPY (EGD) WITH PROPOFOL ;  Surgeon: Toledo, Ladell POUR, MD;  Location: [facility] ENDOSCOPY;  Service: Gastroenterology;  Laterality: N/A;   ESOPHAGOGASTRODUODENOSCOPY (EGD) WITH PROPOFOL  N/A 01/17/2021   Procedure: ESOPHAGOGASTRODUODENOSCOPY (EGD) WITH PROPOFOL ;  Surgeon: Unk Corinn Skiff, MD;  Location: [facility];  Service: Gastroenterology;  Laterality: N/A;   HERNIA REPAIR     PARTIAL COLECTOMY     TONSILLECTOMY     VENTRAL HERNIA REPAIR     VIDEO BRONCHOSCOPY WITH ENDOBRONCHIAL NAVIGATION N/A 03/17/2024   Procedure: VIDEO BRONCHOSCOPY WITH ENDOBRONCHIAL NAVIGATION;  Surgeon: Parris Manna, MD;  Location: [facility] ORS;  Service: Thoracic;  Laterality: N/A;   VIDEO BRONCHOSCOPY WITH ENDOBRONCHIAL ULTRASOUND N/A 03/17/2024   Procedure: BRONCHOSCOPY, WITH EBUS;  Surgeon: Parris Manna, MD;  Location: [facility] ORS;  Service: Thoracic;  Laterality: N/A;    FAMILY HISTORY: family history includes Diabetes in her father, maternal grandmother, mother, paternal grandmother, and sister.  SOCIAL HISTORY:  reports that she quit smoking about 5 years ago. Her smoking use included cigarettes. She started smoking about 57 years ago. She has a 52 pack-year smoking history. She has never used smokeless tobacco. She reports that she does not currently use drugs. She reports that she does not drink alcohol.  ALLERGIES: Molds & smuts, Other, Penicillins, Pioglitazone, Byetta 10 mcg pen [exenatide], Dapagliflozin, Jardiance [empagliflozin], Victoza [liraglutide], Sulfa antibiotics, and Sulfasalazine  MEDICATIONS:  Current Outpatient Medications  Medication Sig Dispense Refill   albuterol  (VENTOLIN  HFA) 108 (90 Base) MCG/ACT inhaler SMARTSIG:2 Puff(s) By Mouth Every 6 Hours PRN     BD PEN NEEDLE NANO 2ND GEN 32G X 4 MM MISC USE 1 EACH ONCE DAILY     budesonide (PULMICORT) 0.5 MG/2ML nebulizer solution Take 0.5 mg by  nebulization 2 (two) times daily.     Cholecalciferol (VITAMIN D3) 5000 units TABS Take 5,000 Units by mouth daily.      Cyanocobalamin  (VITAMIN B-12 PO) Take by mouth daily.     fluticasone  (FLONASE ) 50 MCG/ACT nasal spray Place 2 sprays into the nose daily.     gabapentin (NEURONTIN) 100 MG capsule Take 100 mg by mouth at bedtime as needed.     glipiZIDE (GLUCOTROL) 10 MG tablet Take 20 mg by mouth 2 (two) times daily.     hydrochlorothiazide  (HYDRODIURIL ) 25 MG tablet Hold until followup with outpatient doctor due to intermittent low blood pressure. (Patient taking differently: Now taking)     Insulin  Degludec (TRESIBA FLEXTOUCH [place]) Inject 40 Units into the skin daily. mornings     Iron  Combinations (IRON  COMPLEX PO) Take by mouth.     lisinopril  (ZESTRIL ) 40 MG tablet Hold until followup with outpatient doctor due to intermittent low blood pressure.     lovastatin (MEVACOR) 40 MG tablet Take 2 tablets by mouth daily.     MAGNESIUM  PO Take 1 tablet by mouth at bedtime.     metFORMIN (GLUCOPHAGE) 1000 MG tablet Take 1,000 mg by mouth 2 (two) times daily.     omeprazole (PRILOSEC) 20 MG capsule Take 20 mg by mouth 2 (two) times daily.     torsemide (DEMADEX) 20 MG tablet Take by mouth.     No current facility-administered medications for this encounter.    ECOG PERFORMANCE STATUS:  0 - Asymptomatic  REVIEW OF SYSTEMS: Patient denies any weight loss, fatigue, weakness, fever, chills or night sweats. Patient denies any loss of vision, blurred vision. Patient denies any ringing  of the ears or hearing loss. No irregular heartbeat. Patient denies heart murmur or history of fainting. Patient denies any chest pain or pain radiating to her upper extremities. Patient denies any  shortness of breath, difficulty breathing at night, cough or hemoptysis. Patient denies any swelling in the lower legs. Patient denies any nausea vomiting, vomiting of blood, or coffee ground material in the vomitus. Patient  denies any stomach pain. Patient states has had normal bowel movements no significant constipation or diarrhea. Patient denies any dysuria, hematuria or significant nocturia. Patient denies any problems walking, swelling in the joints or loss of balance. Patient denies any skin changes, loss of hair or loss of weight. Patient denies any excessive worrying or anxiety or significant depression. Patient denies any problems with insomnia. Patient denies excessive thirst, polyuria, polydipsia. Patient denies any swollen glands, patient denies easy bruising or easy bleeding. Patient denies any recent infections, allergies or URI. Patient s visual fields have not changed significantly in recent time.   PHYSICAL EXAM: BP (!) 158/87   Pulse 84   Temp 97.6 F (36.4 C)   Resp 16   Wt 160 lb (72.6 kg)   BMI 32.32 kg/m  Well-developed well-nourished patient in NAD. HEENT reveals PERLA, EOMI, discs not visualized.  Oral cavity is clear. No oral mucosal lesions are identified. Neck is clear without evidence of cervical or supraclavicular adenopathy. Lungs are clear to A&P. Cardiac examination is essentially unremarkable with regular rate and rhythm without murmur rub or thrill. Abdomen is benign with no organomegaly or masses noted. Motor sensory and DTR levels are equal and symmetric in the upper and lower extremities. Cranial nerves II through XII are grossly intact. Proprioception is intact. No peripheral adenopathy or edema is identified. No motor or sensory levels are noted. Crude visual fields are within normal range.  LABORATORY DATA: Pathology cytology and surgical pathology reviewed    RADIOLOGY RESULTS: CT scans reviewed compatible with above-stated findings   IMPRESSION: Probable stage I adenocarcinoma of the right lower lobe in 73 year old female  PLAN: At this time lesion is quite small and slowly growing.  Does have some atypical cells on cytology although no definitive malignancy on my review  of her surgical pathology.  I would like to wait another 4 months repeat her CT scan should show any progression at that time I would offer SBRT treatment.  Patient comprehends my recommendations well.  Will continue to follow appointments and CT scan scan were reviewed arranged.  Patient knows to call in the meantime with any concerns.  I would like to take this opportunity to thank you for allowing me to participate in the care of your patient.Brenda Marcey Penton, MD

## 2024-04-25 ENCOUNTER — Telehealth: Payer: Self-pay | Admitting: Radiation Oncology

## 2024-04-25 NOTE — Telephone Encounter (Signed)
 Called pt to sch CT - left vm for call back - LH

## 2024-04-26 ENCOUNTER — Telehealth: Payer: Self-pay | Admitting: Radiation Oncology

## 2024-04-26 ENCOUNTER — Encounter: Payer: Self-pay | Admitting: Radiation Oncology

## 2024-04-26 ENCOUNTER — Encounter: Payer: Self-pay | Admitting: Oncology

## 2024-04-26 NOTE — Telephone Encounter (Signed)
 error

## 2024-04-26 NOTE — Telephone Encounter (Signed)
 Called pt to sched CT - pt confirmed date/time/location - pt requested appt reminder via mail - LH

## 2024-05-23 ENCOUNTER — Other Ambulatory Visit: Payer: Self-pay | Admitting: Gastroenterology

## 2024-05-23 DIAGNOSIS — I8510 Secondary esophageal varices without bleeding: Secondary | ICD-10-CM

## 2024-05-23 DIAGNOSIS — K7469 Other cirrhosis of liver: Secondary | ICD-10-CM

## 2024-05-26 ENCOUNTER — Ambulatory Visit
Admission: RE | Admit: 2024-05-26 | Discharge: 2024-05-26 | Disposition: A | Source: Ambulatory Visit | Attending: Gastroenterology

## 2024-05-26 DIAGNOSIS — I8510 Secondary esophageal varices without bleeding: Secondary | ICD-10-CM | POA: Diagnosis present

## 2024-05-26 DIAGNOSIS — K7469 Other cirrhosis of liver: Secondary | ICD-10-CM | POA: Insufficient documentation

## 2024-05-26 DIAGNOSIS — K7460 Unspecified cirrhosis of liver: Secondary | ICD-10-CM | POA: Insufficient documentation

## 2024-06-20 ENCOUNTER — Inpatient Hospital Stay

## 2024-06-20 DIAGNOSIS — D500 Iron deficiency anemia secondary to blood loss (chronic): Secondary | ICD-10-CM

## 2024-06-20 LAB — CBC WITH DIFFERENTIAL (CANCER CENTER ONLY)
Abs Immature Granulocytes: 0.04 10*3/uL (ref 0.00–0.07)
Basophils Absolute: 0 10*3/uL (ref 0.0–0.1)
Basophils Relative: 1 %
Eosinophils Absolute: 0.1 10*3/uL (ref 0.0–0.5)
Eosinophils Relative: 2 %
HCT: 32.8 % — ABNORMAL LOW (ref 36.0–46.0)
Hemoglobin: 10.2 g/dL — ABNORMAL LOW (ref 12.0–15.0)
Immature Granulocytes: 1 %
Lymphocytes Relative: 22 %
Lymphs Abs: 0.9 10*3/uL (ref 0.7–4.0)
MCH: 25.5 pg — ABNORMAL LOW (ref 26.0–34.0)
MCHC: 31.1 g/dL (ref 30.0–36.0)
MCV: 82 fL (ref 80.0–100.0)
Monocytes Absolute: 0.4 10*3/uL (ref 0.1–1.0)
Monocytes Relative: 8 %
Neutro Abs: 2.8 10*3/uL (ref 1.7–7.7)
Neutrophils Relative %: 66 %
Platelet Count: 106 10*3/uL — ABNORMAL LOW (ref 150–400)
RBC: 4 MIL/uL (ref 3.87–5.11)
RDW: 15.6 % — ABNORMAL HIGH (ref 11.5–15.5)
WBC Count: 4.3 10*3/uL (ref 4.0–10.5)
nRBC: 0 % (ref 0.0–0.2)

## 2024-06-20 LAB — RETIC PANEL
Immature Retic Fract: 14.3 % (ref 2.3–15.9)
RBC.: 3.99 MIL/uL (ref 3.87–5.11)
Retic Count, Absolute: 68.2 10*3/uL (ref 19.0–186.0)
Retic Ct Pct: 1.7 % (ref 0.4–3.1)
Reticulocyte Hemoglobin: 26.7 pg — ABNORMAL LOW

## 2024-06-20 LAB — IRON AND TIBC
Iron: 32 ug/dL (ref 28–170)
Saturation Ratios: 7 % — ABNORMAL LOW (ref 10.4–31.8)
TIBC: 438 ug/dL (ref 250–450)
UIBC: 406 ug/dL

## 2024-06-20 LAB — FERRITIN: Ferritin: 36 ng/mL (ref 11–307)

## 2024-06-27 ENCOUNTER — Ambulatory Visit: Admitting: Oncology

## 2024-06-27 ENCOUNTER — Ambulatory Visit

## 2024-06-30 ENCOUNTER — Ambulatory Visit: Admission: RE | Admit: 2024-06-30 | Source: Home / Self Care | Admitting: Gastroenterology

## 2024-06-30 ENCOUNTER — Encounter: Admission: RE | Payer: Self-pay | Source: Home / Self Care

## 2024-08-21 ENCOUNTER — Other Ambulatory Visit

## 2024-09-04 ENCOUNTER — Ambulatory Visit: Admitting: Radiation Oncology
